# Patient Record
Sex: Female | Born: 1980 | Race: Black or African American | Hispanic: No | Marital: Single | Smoking: Current every day smoker
Health system: Southern US, Community
[De-identification: ages and names within clinical notes are randomized; demographics above are authoritative.]

## PROBLEM LIST (undated history)

## (undated) ENCOUNTER — Inpatient Hospital Stay (HOSPITAL_COMMUNITY): Payer: Self-pay

## (undated) DIAGNOSIS — O009 Unspecified ectopic pregnancy without intrauterine pregnancy: Secondary | ICD-10-CM

## (undated) DIAGNOSIS — F1020 Alcohol dependence, uncomplicated: Secondary | ICD-10-CM

## (undated) DIAGNOSIS — Z923 Personal history of irradiation: Secondary | ICD-10-CM

## (undated) DIAGNOSIS — C539 Malignant neoplasm of cervix uteri, unspecified: Secondary | ICD-10-CM

## (undated) HISTORY — DX: Alcohol dependence, uncomplicated: F10.20

## (undated) HISTORY — PX: ABDOMINAL HYSTERECTOMY: SHX81

## (undated) HISTORY — PX: DILATION AND CURETTAGE OF UTERUS: SHX78

---
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
## 1999-03-07 ENCOUNTER — Encounter: Payer: Self-pay | Admitting: Emergency Medicine

## 1999-03-07 ENCOUNTER — Emergency Department (HOSPITAL_COMMUNITY): Admission: EM | Admit: 1999-03-07 | Discharge: 1999-03-07 | Payer: Self-pay | Admitting: Emergency Medicine

## 1999-04-17 ENCOUNTER — Encounter: Admission: RE | Admit: 1999-04-17 | Discharge: 1999-05-25 | Payer: Self-pay | Admitting: Orthopedic Surgery

## 2000-09-23 ENCOUNTER — Emergency Department (HOSPITAL_COMMUNITY): Admission: EM | Admit: 2000-09-23 | Discharge: 2000-09-23 | Payer: Self-pay | Admitting: Emergency Medicine

## 2001-07-11 ENCOUNTER — Emergency Department (HOSPITAL_COMMUNITY): Admission: EM | Admit: 2001-07-11 | Discharge: 2001-07-11 | Payer: Self-pay | Admitting: Emergency Medicine

## 2003-06-25 ENCOUNTER — Emergency Department (HOSPITAL_COMMUNITY): Admission: EM | Admit: 2003-06-25 | Discharge: 2003-06-25 | Payer: Self-pay | Admitting: Emergency Medicine

## 2003-10-21 ENCOUNTER — Emergency Department (HOSPITAL_COMMUNITY): Admission: EM | Admit: 2003-10-21 | Discharge: 2003-10-21 | Payer: Self-pay | Admitting: Emergency Medicine

## 2003-12-07 ENCOUNTER — Emergency Department (HOSPITAL_COMMUNITY): Admission: EM | Admit: 2003-12-07 | Discharge: 2003-12-07 | Payer: Self-pay | Admitting: Emergency Medicine

## 2003-12-10 ENCOUNTER — Emergency Department (HOSPITAL_COMMUNITY): Admission: EM | Admit: 2003-12-10 | Discharge: 2003-12-10 | Payer: Self-pay | Admitting: Emergency Medicine

## 2003-12-11 ENCOUNTER — Inpatient Hospital Stay (HOSPITAL_COMMUNITY): Admission: AD | Admit: 2003-12-11 | Discharge: 2003-12-11 | Payer: Self-pay | Admitting: Obstetrics and Gynecology

## 2003-12-13 ENCOUNTER — Inpatient Hospital Stay (HOSPITAL_COMMUNITY): Admission: AD | Admit: 2003-12-13 | Discharge: 2003-12-13 | Payer: Self-pay | Admitting: Obstetrics and Gynecology

## 2007-05-29 ENCOUNTER — Emergency Department (HOSPITAL_COMMUNITY): Admission: EM | Admit: 2007-05-29 | Discharge: 2007-05-29 | Payer: Self-pay | Admitting: Emergency Medicine

## 2007-09-14 ENCOUNTER — Emergency Department (HOSPITAL_COMMUNITY): Admission: EM | Admit: 2007-09-14 | Discharge: 2007-09-14 | Payer: Self-pay | Admitting: Emergency Medicine

## 2007-09-14 ENCOUNTER — Ambulatory Visit (HOSPITAL_COMMUNITY): Admission: RE | Admit: 2007-09-14 | Discharge: 2007-09-14 | Payer: Self-pay | Admitting: Emergency Medicine

## 2008-03-24 ENCOUNTER — Emergency Department (HOSPITAL_COMMUNITY): Admission: EM | Admit: 2008-03-24 | Discharge: 2008-03-24 | Payer: Self-pay | Admitting: Emergency Medicine

## 2008-08-26 ENCOUNTER — Emergency Department (HOSPITAL_COMMUNITY): Admission: EM | Admit: 2008-08-26 | Discharge: 2008-08-26 | Payer: Self-pay | Admitting: Family Medicine

## 2008-12-13 ENCOUNTER — Emergency Department (HOSPITAL_COMMUNITY): Admission: EM | Admit: 2008-12-13 | Discharge: 2008-12-13 | Payer: Self-pay | Admitting: Emergency Medicine

## 2011-06-22 ENCOUNTER — Inpatient Hospital Stay (INDEPENDENT_AMBULATORY_CARE_PROVIDER_SITE_OTHER)
Admission: RE | Admit: 2011-06-22 | Discharge: 2011-06-22 | Disposition: A | Discharge status: Home / Self Care | Payer: Self-pay | Source: Ambulatory Visit | Attending: Family Medicine | Admitting: Family Medicine

## 2011-06-22 DIAGNOSIS — K047 Periapical abscess without sinus: Secondary | ICD-10-CM

## 2011-06-26 ENCOUNTER — Emergency Department (HOSPITAL_COMMUNITY)
Admission: EM | Admit: 2011-06-26 | Discharge: 2011-06-26 | Disposition: A | Discharge status: Home / Self Care | Payer: Self-pay | Attending: Emergency Medicine | Admitting: Emergency Medicine

## 2011-06-26 DIAGNOSIS — R221 Localized swelling, mass and lump, neck: Secondary | ICD-10-CM | POA: Insufficient documentation

## 2011-06-26 DIAGNOSIS — R220 Localized swelling, mass and lump, head: Secondary | ICD-10-CM | POA: Insufficient documentation

## 2011-06-26 DIAGNOSIS — K089 Disorder of teeth and supporting structures, unspecified: Secondary | ICD-10-CM | POA: Insufficient documentation

## 2011-06-26 LAB — DIFFERENTIAL
Basophils Relative: 0 % (ref 0–1)
Eosinophils Absolute: 0 10*3/uL (ref 0.0–0.7)
Lymphs Abs: 1.5 10*3/uL (ref 0.7–4.0)
Monocytes Absolute: 1.5 10*3/uL — ABNORMAL HIGH (ref 0.1–1.0)
Monocytes Relative: 7 % (ref 3–12)
Neutro Abs: 17.9 10*3/uL — ABNORMAL HIGH (ref 1.7–7.7)

## 2011-06-26 LAB — CBC
Hemoglobin: 14.5 g/dL (ref 12.0–15.0)
MCH: 34.3 pg — ABNORMAL HIGH (ref 26.0–34.0)
MCHC: 35.1 g/dL (ref 30.0–36.0)
MCV: 97.6 fL (ref 78.0–100.0)

## 2011-06-26 LAB — BASIC METABOLIC PANEL
BUN: 6 mg/dL (ref 6–23)
CO2: 25 mEq/L (ref 19–32)
Calcium: 9.8 mg/dL (ref 8.4–10.5)
Creatinine, Ser: 0.64 mg/dL (ref 0.50–1.10)
GFR calc non Af Amer: >60 mL/min (ref >60)
Glucose, Bld: 105 mg/dL — ABNORMAL HIGH (ref 70–99)
Sodium: 133 mEq/L — ABNORMAL LOW (ref 135–145)

## 2011-11-27 DIAGNOSIS — R51 Headache: Secondary | ICD-10-CM | POA: Insufficient documentation

## 2011-11-27 DIAGNOSIS — S01409A Unspecified open wound of unspecified cheek and temporomandibular area, initial encounter: Secondary | ICD-10-CM | POA: Insufficient documentation

## 2011-11-28 ENCOUNTER — Emergency Department (HOSPITAL_COMMUNITY)
Admission: EM | Admit: 2011-11-28 | Discharge: 2011-11-28 | Disposition: A | Discharge status: Home / Self Care | Payer: Self-pay | Attending: Emergency Medicine | Admitting: Emergency Medicine

## 2011-11-28 ENCOUNTER — Encounter (HOSPITAL_COMMUNITY): Payer: Self-pay | Admitting: Emergency Medicine

## 2011-11-28 DIAGNOSIS — S0181XA Laceration without foreign body of other part of head, initial encounter: Secondary | ICD-10-CM

## 2011-11-28 MED ORDER — OXYCODONE-ACETAMINOPHEN 5-325 MG PO TABS
2.0000 | ORAL_TABLET | Freq: Once | ORAL | Status: AC
  Administered 2011-11-28: 2 via ORAL
  Filled 2011-11-28: qty 2
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
## 2011-11-28 NOTE — ED Provider Notes (Signed)
Medical screening examination/treatment/procedure(s) were performed by non-physician practitioner and as supervising physician I was immediately available for consultation/collaboration.   Keydi Giel L Mithcell Schumpert, MD 11/28/11 0734 

## 2011-11-28 NOTE — ED Notes (Signed)
Laceration under right eye approx 1 cm in length  14 cm in depth.  Bleeding conttolled
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
## 2011-12-05 ENCOUNTER — Encounter (HOSPITAL_COMMUNITY): Payer: Self-pay | Admitting: *Deleted

## 2011-12-05 ENCOUNTER — Emergency Department (HOSPITAL_COMMUNITY)
Admission: EM | Admit: 2011-12-05 | Discharge: 2011-12-05 | Disposition: A | Discharge status: Home / Self Care | Payer: Self-pay | Attending: Emergency Medicine | Admitting: Emergency Medicine

## 2011-12-05 DIAGNOSIS — F17200 Nicotine dependence, unspecified, uncomplicated: Secondary | ICD-10-CM | POA: Insufficient documentation

## 2011-12-05 DIAGNOSIS — R220 Localized swelling, mass and lump, head: Secondary | ICD-10-CM | POA: Insufficient documentation

## 2011-12-05 DIAGNOSIS — Z4802 Encounter for removal of sutures: Secondary | ICD-10-CM | POA: Insufficient documentation

## 2011-12-05 NOTE — ED Notes (Signed)
Patient here for removal of stiches that are under her right eye

## 2011-12-05 NOTE — ED Provider Notes (Signed)
History     CSN: [id number]  Arrival date & time 12/05/11  1340   First MD Initiated Contact with Patient 12/05/11 1531      Chief Complaint  Patient presents with  . Suture / Staple Removal    placed last monday  . Facial Swelling    (Consider location/radiation/quality/duration/timing/severity/associated sxs/prior treatment) HPI Comments: Patient sustained a laceration one week ago today.  She had 5 sutures placed in her right cheek.  She comes in today because she believes they need to come out as they are starting to get overgrown.  She's had no significant drainage or fevers.  She has some mild swelling to her cheek still but other than that has no new complaints  Patient is a 31 y.o. female presenting with suture removal. The history is provided by the patient. No language interpreter was used.  Suture / Staple Removal  The sutures were placed 7 to 10 days ago. There has been no treatment since the wound repair.    History reviewed. No pertinent past medical history.  History reviewed. No pertinent past surgical history.  History reviewed. No pertinent family history.  History  Substance Use Topics  . Smoking status: Current Everyday Smoker  . Smokeless tobacco: Not on file  . Alcohol Use: Yes     occ    OB History    Grav Para Term Preterm Abortions TAB SAB Ect Mult Living                  Review of Systems  Constitutional: Negative.   HENT: Negative.   Eyes: Negative.  Negative for discharge.  Respiratory: Negative.   Cardiovascular: Negative.   Gastrointestinal: Negative.   Genitourinary: Negative.   Musculoskeletal: Negative.   Skin: Negative.  Negative for color change.  Neurological: Negative.  Negative for headaches.  Hematological: Negative.   Psychiatric/Behavioral: Negative.   All other systems reviewed and are negative.    Allergies  Food and Neosporin  Home Medications   Current Outpatient Rx  Name Route Sig Dispense Refill  .  IBUPROFEN 200 MG PO TABS Oral Take 200 mg by mouth every 6 (six) hours as needed. For pain      BP 114/81  Pulse 88  Temp(Src) 98.7 F (37.1 C) (Oral)  Resp 20  SpO2 100%  LMP 12/01/2011  Physical Exam  Nursing note and vitals reviewed. Constitutional: She is oriented to person, place, and time. She appears well-developed and well-nourished.  Non-toxic appearance. She does not have a sickly appearance.  HENT:  Head: Normocephalic and atraumatic.  Eyes: Conjunctivae, EOM and lids are normal. Pupils are equal, round, and reactive to light. No scleral icterus.  Neck: Trachea normal and normal range of motion. Neck supple.  Cardiovascular: Normal rate.   Pulmonary/Chest: Effort normal and breath sounds normal.  Abdominal: Soft. Normal appearance. There is no CVA tenderness.  Musculoskeletal: Normal range of motion.  Neurological: She is alert and oriented to person, place, and time. She has normal strength.  Skin: Skin is warm, dry and intact. No rash noted.       Right cheek has approximately 1 inch wound with 5 Sutures in place.  Wound appears well-healed and stitches are starting to be overgrown by tissue  Psychiatric: She has a normal mood and affect. Her behavior is normal. Judgment and thought content normal.    ED Course  SUTURE REMOVAL Date/Time: 12/05/2011 3:49 PM Performed by: Emeline General A Authorized by: Emeline General A Consent: Verbal  consent obtained. Risks and benefits: risks, benefits and alternatives were discussed Consent given by: patient Patient understanding: patient states understanding of the procedure being performed Patient identity confirmed: verbally with patient Body area: head/neck Location details: right cheek Wound Appearance: clean Sutures Removed: 5 Post-removal: Steri-Strips applied Facility: sutures placed in this facility Patient tolerance: Patient tolerated the procedure well with no immediate complications.   (including critical  care time)  Labs Reviewed - No data to display No results found.   1. Visit for suture removal       MDM  Patient had her 5 sutures removed.  There was still some gaping of the lateral aspect of the wound so Steri-Strips were placed which gently close the wound.  Sutures have been in for 7 days so they did necessitate removal on this visit.  There's no signs of acute infection.  Patient was given further wound care instructions.        Nat Christen, MD 12/05/11 1550

## 2011-12-05 NOTE — ED Notes (Signed)
Pt has swelling under right eye from stitches and thinks need to come out since placed over a week ago

## 2014-01-26 ENCOUNTER — Emergency Department (HOSPITAL_COMMUNITY): Payer: Self-pay

## 2014-01-26 ENCOUNTER — Emergency Department (HOSPITAL_COMMUNITY)
Admission: EM | Admit: 2014-01-26 | Discharge: 2014-01-26 | Disposition: A | Discharge status: Home / Self Care | Payer: Self-pay | Attending: Emergency Medicine | Admitting: Emergency Medicine

## 2014-01-26 ENCOUNTER — Encounter (HOSPITAL_COMMUNITY): Payer: Self-pay | Admitting: Emergency Medicine

## 2014-01-26 DIAGNOSIS — R209 Unspecified disturbances of skin sensation: Secondary | ICD-10-CM | POA: Insufficient documentation

## 2014-01-26 DIAGNOSIS — M25519 Pain in unspecified shoulder: Secondary | ICD-10-CM | POA: Insufficient documentation

## 2014-01-26 DIAGNOSIS — M25512 Pain in left shoulder: Secondary | ICD-10-CM

## 2014-01-26 DIAGNOSIS — F17200 Nicotine dependence, unspecified, uncomplicated: Secondary | ICD-10-CM | POA: Insufficient documentation

## 2014-01-26 DIAGNOSIS — M542 Cervicalgia: Secondary | ICD-10-CM | POA: Insufficient documentation

## 2014-01-26 DIAGNOSIS — M791 Myalgia, unspecified site: Secondary | ICD-10-CM

## 2014-01-26 MED ORDER — DIAZEPAM 5 MG PO TABS: 5.0000 mg | ORAL_TABLET | Freq: Once | ORAL | Status: DC

## 2014-01-26 MED ORDER — OXYCODONE-ACETAMINOPHEN 5-325 MG PO TABS
1.0000 | ORAL_TABLET | Freq: Once | ORAL | Status: AC
  Administered 2014-01-26: 1 via ORAL
  Filled 2014-01-26: qty 1

## 2014-01-26 MED ORDER — METHOCARBAMOL 500 MG PO TABS
500.0000 mg | ORAL_TABLET | Freq: Once | ORAL | Status: AC
  Administered 2014-01-26: 500 mg via ORAL
  Filled 2014-01-26: qty 1

## 2014-01-26 MED ORDER — MELOXICAM 7.5 MG PO TABS: 7.5000 mg | ORAL_TABLET | Freq: Every day | ORAL | Status: DC

## 2014-01-26 MED ORDER — OXYCODONE-ACETAMINOPHEN 5-325 MG PO TABS: 1.0000 | ORAL_TABLET | Freq: Three times a day (TID) | ORAL | Status: DC | PRN

## 2014-01-26 MED ORDER — METHOCARBAMOL 500 MG PO TABS: 500.0000 mg | ORAL_TABLET | Freq: Two times a day (BID) | ORAL | Status: DC

## 2014-01-26 NOTE — ED Notes (Signed)
Pt stating pain is 10/10.  Nad.  Pt resting comfortably in chair.

## 2014-01-26 NOTE — ED Notes (Signed)
Pt states she woke up a week ago and had pain in her left arm and thinks it is a pinched nerve because she has pain in her neck and hand too.

## 2014-01-26 NOTE — Discharge Instructions (Signed)
Please call your doctor for a followup appointment within 24-48 hours. When you talk to your doctor please let them know that you were seen in the emergency department and have them acquire all of your records so that they can discuss the findings with you and formulate a treatment plan to fully care for your new and ongoing problems. Please call and set up appointment with orthopedics to be reassessed regarding episode of neck pain Please apply ice and icy hot ointment to aid in muscular relief Please take medications as prescribed-while on pain medications his be absolutely no drinking alcohol, driving, operating any heavy machinery. Please do not take any extra Tylenol for this can lead to Tylenol overdose and liver failure. Please take on a full stomach. Please take MOBIC and Robaxin as prescribed Please massage with icy hot ointment to aid in muscular relief Please continue to monitor symptoms closely and if symptoms are to worsen or change (fever greater 101, chills, nausea, vomiting, chest pain, shortness of breath, difficulty breathing, numbness, tingling, worsening neck pain, inability to swallow) please report back to the ED immediately  Myalgia, Adult Myalgia is the medical term for muscle pain. It is a symptom of many things. Nearly everyone at some time in their life has this. The most common cause for muscle pain is overuse or straining and more so when you are not in shape. Injuries and muscle bruises cause myalgias. Muscle pain without a history of injury can also be caused by a virus. It frequently comes along with the flu. Myalgia not caused by muscle strain can be present in a large number of infectious diseases. Some autoimmune diseases like lupus and fibromyalgia can cause muscle pain. Myalgia may be mild, or severe. SYMPTOMS  The symptoms of myalgia are simply muscle pain. Most of the time this is short lived and the pain goes away without treatment. DIAGNOSIS  Myalgia is diagnosed  by your caregiver by taking your history. This means you tell him when the problems began, what they are, and what has been happening. If this has not been a long term problem, your caregiver may want to watch for a while to see what will happen. If it has been long term, they may want to do additional testing. TREATMENT  The treatment depends on what the underlying cause of the muscle pain is. Often anti-inflammatory medications will help. HOME CARE INSTRUCTIONS  If the pain in your muscles came from overuse, slow down your activities until the problems go away.  Myalgia from overuse of a muscle can be treated with alternating hot and cold packs on the muscle affected or with cold for the first couple days. If either heat or cold seems to make things worse, stop their use.  Apply ice to the sore area for 15-20 minutes, 03-04 times per day, while awake for the first 2 days of muscle soreness, or as directed. Put the ice in a plastic bag and place a towel between the bag of ice and your skin.  Only take over-the-counter or prescription medicines for pain, discomfort, or fever as directed by your caregiver.  Regular gentle exercise may help if you are not active.  Stretching before strenuous exercise can help lower the risk of myalgia. It is normal when beginning an exercise regimen to feel some muscle pain after exercising. Muscles that have not been used frequently will be sore at first. If the pain is extreme, this may mean injury to a muscle. [place]  IF:  You have an increase in muscle pain that is not relieved with medication.  You begin to run a temperature.  You develop nausea and vomiting.  You develop a stiff and painful neck.  You develop a rash.  You develop muscle pain after a tick bite.  You have continued muscle pain while working out even after you are in good condition. SEEK IMMEDIATE MEDICAL CARE IF: Any of your problems are getting worse and medications are  not helping. MAKE SURE YOU:   Understand these instructions.  Will watch your condition.  Will get help right away if you are not doing well or get worse. Document Released: 09/26/2006 Document Revised: 01/27/2012 Document Reviewed: 12/16/2006 [facility] Patient Information 2014 [employer] [place].   Emergency Department Resource Guide 1) Find a Doctor and Pay Out of Pocket Although you won't have to find out who is covered by your insurance plan, it is a good idea to ask around and get recommendations. You will then need to call the office and see if the doctor you have chosen will accept you as a new patient and what types of options they offer for patients who are self-pay. Some doctors offer discounts or will set up payment plans for their patients who do not have insurance, but you will need to ask so you aren't surprised when you get to your appointment.  2) Contact Your Local Health Department Not all health departments have doctors that can see patients for sick visits, but many do, so it is worth a call to see if yours does. If you don't know where your local health department is, you can check in your phone book. The CDC also has a tool to help you locate your state's health department, and many state websites also have listings of all of their local health departments.  3) Find a Walk-in Clinic If your illness is not likely to be very severe or complicated, you may want to try a walk in clinic. These are popping up all over the country in pharmacies, drugstores, and shopping centers. They're usually staffed by nurse practitioners or physician assistants that have been trained to treat common illnesses and complaints. They're usually fairly quick and inexpensive. However, if you have serious medical issues or chronic medical problems, these are probably not your best option.  No Primary Care Doctor: - Call Health Connect at  [phone] - they can help you locate a primary care doctor that   accepts your insurance, provides certain services, etc. - Physician Referral Service- [phone]  Chronic Pain Problems: Organization         Address  Phone   Notes  [facility]  [phone] Patients need to be referred by their primary care doctor.   Medication Assistance: Organization         Address  Phone   Notes  [facility] [address] [place], [place] [id number] [phone] --Must be a resident of [place] [place] -- Must have NO insurance coverage whatsoever (no Medicaid/ Medicare, etc.) -- The pt. MUST have a primary care doctor that directs their care regularly and follows them in the community   MedAssist  [phone]   [employer]  [phone]    Agencies that provide inexpensive medical care: Organization         Address  Phone   Notes  [facility]  [phone]   [facility]    (  [phone]   [facility] [address] [place], [place] [address] [phone]   [facility] [address] [place] [place] [phone]   Planned Parenthood    [phone]   [facility]    [phone]   [facility]  [address], [place] Phone:  [phone], Fax:  [phone] Hours of Operation:  9 am - 6 pm, M-F.  Also accepts Medicaid/Medicare and self-pay.  [facility]  [address], [place] Phone: [phone], Fax: [phone]. Hours of Operation:  8:30 am - 5:30 pm, M-F.  Also accepts Medicaid and self-pay.  [facility] [place] [address] [place] Phone: [phone]   [facility] [address] Natasha Bence [place], [place] [phone], Ext. [phone] Mondays & Thursdays: 7-9 AM.  First 15 patients are seen on a first come, first serve basis.    Medicaid-accepting [facility] Providers:  Organization          Address  Phone   Notes  [facility] [place] [address], [place] [phone] Also accepts self-pay patients.  [facility] [address] Laurell Josephs [place], [place]  [phone]   [facility] [address], [place] [phone]   [facility] [address], [place] [phone]   Renaye Rakers [address], [place]   [phone] Only accepts [place] Access [place] patients after they have their name applied to their card.   Self-Pay (no insurance) in [facility]:  Organization         Address  Phone   Notes  Sickle Cell Patients, [facility] [address] [place], [place] [phone]   [facility] [address] [place], [place] [phone]   [facility]  [address], [place] [phone]   [facility]/Dr. Osei-Bonsu  [address], [place] or [address], [place] [phone] Phone number for both [place] and [place] locations is the same.  [facility] [address] [place] [phone]   [facility] [address] [place] or [address] [phone]   [facility] [address], [place] [phone], phone; [phone], fax Sees patients 1st and 3rd Saturday of every month.  Must not qualify for public or private insurance (i.e. Medicaid, Medicare, [place] Health Choice, Veterans' Benefits)  Household income should be no more than 200% of the poverty level The clinic cannot treat you if you are pregnant or think you are pregnant  Sexually transmitted diseases are not treated at the clinic.    Dental Care: Organization         Address  Phone  Notes  [facility] Department of [facility] [address] [place],  [place] [phone] Accepts children up to age 93 who are enrolled in [place] or [place] Health Choice; pregnant women with a Medicaid card; and children who have applied for Medicaid or [place] Health Choice, but were declined, whose parents can pay a reduced fee at time of service.  [facility] Department of [facility]  [address], [place] [phone] Accepts children up  to age 73 who are enrolled in Medicaid or [place] Health Choice; pregnant women with a Medicaid card; and children who have applied for Medicaid or [place] Health Choice, but were declined, whose parents can pay a reduced fee at time of service.  [place] Adult Dental Access PROGRAM  [place] [phone] Patients are seen by appointment only. Walk-ins are not accepted. [place] will see patients 61 years of age and older. Monday - Tuesday (8am-5pm) Most Wednesdays (8:30-5pm) $30 per visit, cash only  [facility] Adult Dental Access PROGRAM  [address], [facility] [phone] Patients are seen by appointment only. Walk-ins are not accepted. [place] will see patients 33 years of age and older. One Wednesday Evening (Monthly: Volunteer Based).  $30 per visit, cash only  [place]  [phone] for adults; Children under age 43, call Graduate Pediatric Dentistry at [phone]. Children aged 40-14, please call [phone] to request a pediatric application.  Dental services are provided in all areas of dental care including fillings, crowns and bridges, complete and partial dentures, implants, gum treatment, root canals, and extractions. Preventive care is also provided. Treatment is provided to both adults and children. Patients are selected via a lottery and there is often a waiting list.   [facility] [address] [place]  [phone] [email or web address]   [facility] [address] [place], [place]  [phone], Ext. [phone] Second and Fourth Thursday of each month, opens at 6:30 AM; Clinic ends at 9 AM.  Patients are seen on a first-come first-served basis, and a limited number are seen during each clinic.   [facility]  [address] Hillard Danker [place], [place] [phone]   Eligibility Requirements You must have lived in [place], [place], or [place] counties for at least the last three months.   You cannot be eligible for state or federal sponsored [employer], including [employer], [place], or [employer].   You generally cannot be eligible for healthcare insurance through your employer.    How to apply: Eligibility screenings are held every Tuesday and Wednesday afternoon from 1:00 pm until 4:00 pm. You do not need an appointment for the interview!  [facility] [address], [place], [place]   [place]  [place] Department  [place]  [phone]    Behavioral Health Resources in the Community: Intensive Outpatient Programs Organization         Address  Phone  Notes  [place]. [address], [place], [place] [phone]   [facility] Outpatient [address], [place], [place]   ADS: [facility] [address], [place], [place]   [place] [address]  [place], [place] or [phone]   Substance Abuse Resources Organization         Address  Phone  Notes  [facility] Drug Services  [phone]   [place]  [phone]   [facility] [place]   Chinita Pester  [phone]   Residential & Outpatient Substance Abuse Program  [phone]   Psychological Services Organization         Address  Phone  Notes  [place]  [place]  [place]    [place]  [address], [place] [phone] or [phone]    Mobile Crisis Teams Organization         Address  Phone  Notes  [facility], Mobile Crisis Care Unit  [phone]   [facility]  [address] [place], [place] [phone]   Doristine Locks [address] [place] [phone]    Self-Help/Support Groups Organization         Address  Phone             Notes  Mental Health Assoc. of [employer] - variety of support groups  [phone]- I7437963 Call for more information  Narcotics Anonymous (NA), [facility] [address], [employer] [place]  2 meetings at this location   Statistician         Address  Phone  Notes  [facility] [address],    [place]  [phone]   [facility] [place]  [address] [place] [address], [place], [place] [phone]   [facility] [address] [place], [place] [phone] Admissions: 8am-3pm M-F  [facility] [address],    [place], [place] [phone]   [facility] [address] [place], [place], [place] [phone]   [facility] [address],  [place], [place] [phone]   [facility] - Intensive Outpatient [address] Laurell Josephs [address], [place], [place] [phone]   [facility] : [facility] (Addiction Recovery Care [facility] [address] [place].,  [place], [place] [phone] or [phone]   [facility] ([facility]) [address], [place], [place] [phone] Accepts Medicaid  [facility] [place] [address]  [place] [phone] Substance Abuse/Addiction Treatment   [facility] Organization         Address  Phone  Notes  [facility]  [phone]   Angie Fava, PhD [address] Ervin Knack [place], [place]   ([phone] or [phone]   [facility] Behavioral   [address] [place], [place] [phone]   [facility] [address] [place], [place] [phone] Insurance/Medicaid/sponsorship through [facility] and Families [address]                                    [place], [place] [phone] Therapy/tele-psych/case  [name] [address], [place] [phone]    Dr. Lolly Mustache  [phone]   [facility]  [employer] [facility] Dept. 1) [address] [place] 2) [address], [place] 3)  [address], [place] [phone]  [phone]   [facility] Child Abuse Hotline (414)[phone] or [phone] (After Hours)

## 2014-01-26 NOTE — ED Provider Notes (Signed)
CSN: [id number]     Arrival date & time 01/26/14  1211 History  This chart was scribed for non-physician practitioner, Jamse Mead, PA-C,working with Richarda Blade, MD, by Marlowe Kays, ED Scribe.  This patient was seen in room TR04C/TR04C and the patient's care was started at 2:53 PM.  Chief Complaint  Patient presents with  . Arm Pain   The history is provided by the patient. No language interpreter was used.   HPI Comments:  Sarah Morris is a 33 y.o. female who presents to the Emergency Department complaining of severe constant sharp, aching, throbbing left-sided neck pain that radiates down her arm that started approximately two weeks ago upon waking. She reports associated numbness and tingling of the left arm and intermittently into the hand. Pt reports having similar symptoms before that resolved on their own. She states she has been taking [employer], BC Powder, Ibuprofen, and Tylenol with codeine with no relief. Pt reports not being able to lift anything with that arm due to the pain. 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 She denies CP, SOB, or difficulty breathing. She denies prior injury to the arm. She states she is allergic to Neosporin and black pepper.   History reviewed. No pertinent past medical history. History reviewed. No pertinent past surgical history. History reviewed. No pertinent family history. History  Substance Use Topics  . Smoking status: Current Every Day Smoker    Types: Cigarettes  . Smokeless tobacco: Not on file  . Alcohol Use: Yes     Comment: occ   OB History   Grav Para Term Preterm Abortions TAB SAB Ect Mult Living                 Review of Systems  Respiratory: Negative for shortness of breath.   Cardiovascular: Negative for chest pain.  Musculoskeletal: Positive for myalgias (left arm).  Neurological: Positive for numbness.  All other systems reviewed and are negative.   Allergies  Food and Neosporin  Home  Medications   Current Outpatient Rx  Name  Route  Sig  Dispense  Refill  . Aspirin-Salicylamide-Caffeine (BC HEADACHE PO)   Oral   Take 1 packet by mouth daily as needed (for pain).         Marland Kitchen ibuprofen (ADVIL,MOTRIN) 200 MG tablet   Oral   Take 600 mg by mouth every 6 (six) hours as needed for moderate pain. For pain         . meloxicam (MOBIC) 7.5 MG tablet   Oral   Take 1 tablet (7.5 mg total) by mouth daily.   15 tablet   0   . methocarbamol (ROBAXIN) 500 MG tablet   Oral   Take 1 tablet (500 mg total) by mouth 2 (two) times daily.   20 tablet   0   . oxyCODONE-acetaminophen (PERCOCET/ROXICET) 5-325 MG per tablet   Oral   Take 1 tablet by mouth every 8 (eight) hours as needed for severe pain.   11 tablet   0    Triage Vitals: BP 111/92  Pulse 110  Temp(Src) 98.6 F (37 C) (Oral)  Resp 24  Wt 119 lb 14.4 oz (54.386 kg)  SpO2 100%  LMP 01/05/2014 Physical Exam  Nursing note and vitals reviewed. Constitutional: She is oriented to person, place, and time. She appears well-developed and well-nourished. No distress.  HENT:  Head: Normocephalic and atraumatic.  Mouth/Throat: Oropharynx is clear and moist. No oropharyngeal exudate.  Negative trismus  Uvula midline with symmetrical elevation  Eyes: Conjunctivae and EOM are normal. Pupils are equal, round, and reactive to light. Right eye exhibits no discharge. Left eye exhibits no discharge.  Neck: Normal range of motion. Neck supple.    Discomfort upon palpation to the left side of the neck-muscular nature Negative nuchal rigidity Negative cervical lymphadenopathy  Cardiovascular: Normal rate, regular rhythm and normal heart sounds.  Exam reveals no friction rub.   No murmur heard. Pulses:      Radial pulses are 2+ on the right side, and 2+ on the left side.  Pulmonary/Chest: Effort normal and breath sounds normal. No respiratory distress. She has no wheezes. She has no rales.  Patient is able to speak in full  sentences without difficulty Negative stridor Negative signs of respiratory distress  Musculoskeletal: She exhibits tenderness.       Arms: Negative swelling, erythema, inflammation, lesions, sores, deformities noted to the left shoulder and left upper [place]. Negative deformities identified to the neck. Discomfort upon palpation to the trapezius of the left side, left shoulder, deltoid circumferentially. Muscular pain identified. Decreased range of motion to left upper tremors secondary to pain.  Neurological: She is alert and oriented to person, place, and time. No cranial nerve deficit. She exhibits normal muscle tone. Coordination normal.  Cranial nerves III-XII grossly intact Strength 5+/5+ to upper extremities bilaterally with resistance applied, equal distribution noted Strength intact to MCP, PIP, DIP joints of left hand Sensation intact with differentiation to sharp and dull touch to left upper extremity  Skin: Skin is warm and dry. She is not diaphoretic.  Psychiatric: She has a normal mood and affect. Her behavior is normal. Thought content normal.    ED Course  Procedures (including critical care time) DIAGNOSTIC STUDIES: Oxygen Saturation is 100% on RA, normal by my interpretation.   COORDINATION OF CARE: 2:59 PM- Will give injection of Valium prior to discharge. Pt verbalizes understanding and agrees to plan.  Medications  oxyCODONE-acetaminophen (PERCOCET/ROXICET) 5-325 MG per tablet 1 tablet (1 tablet Oral Given 01/26/14 1438)  oxyCODONE-acetaminophen (PERCOCET/ROXICET) 5-325 MG per tablet 1 tablet (1 tablet Oral Given 01/26/14 1534)  methocarbamol (ROBAXIN) tablet 500 mg (500 mg Oral Given 01/26/14 1534)   Dg Cervical Spine Complete  01/26/2014   CLINICAL DATA Pain with radicular symptoms  EXAM CERVICAL SPINE  4+ VIEWS  COMPARISON None.  FINDINGS Frontal, lateral, open-mouth odontoid, and bilateral oblique views were obtained. There is no fracture or spondylolisthesis.  Prevertebral soft tissues and predental space regions are normal. Disc spaces appear intact. There is no appreciable facet arthropathy on the oblique views. There is reversal of lordotic curvature.  IMPRESSION Reversal of lordotic curvature. This finding is probably due to muscle spasm. If there is concern for ligamentous injury, lateral flexion-extension views could be helpful to further evaluate. There is no appreciable arthropathy. No fracture or spondylolisthesis.  SIGNATURE  Electronically Signed   By: Lowella Grip M.D.   On: 01/26/2014 14:19   Dg Shoulder Left  01/26/2014   CLINICAL DATA Pain  EXAM LEFT SHOULDER - 2+ VIEW  COMPARISON None.  FINDINGS Frontal and Y scapular views were obtained. No fracture or dislocation. Joint spaces appear intact. No erosive change.  IMPRESSION No fracture or dislocation.  No appreciable arthropathy.  SIGNATURE  Electronically Signed   By: Lowella Grip M.D.   On: 01/26/2014 14:19   Labs Review Labs Reviewed - No data to display Imaging Review Dg Cervical Spine Complete  01/26/2014   CLINICAL  DATA Pain with radicular symptoms  EXAM CERVICAL SPINE  4+ VIEWS  COMPARISON None.  FINDINGS Frontal, lateral, open-mouth odontoid, and bilateral oblique views were obtained. There is no fracture or spondylolisthesis. Prevertebral soft tissues and predental space regions are normal. Disc spaces appear intact. There is no appreciable facet arthropathy on the oblique views. There is reversal of lordotic curvature.  IMPRESSION Reversal of lordotic curvature. This finding is probably due to muscle spasm. If there is concern for ligamentous injury, lateral flexion-extension views could be helpful to further evaluate. There is no appreciable arthropathy. No fracture or spondylolisthesis.  SIGNATURE  Electronically Signed   By: Lowella Grip M.D.   On: 01/26/2014 14:19   Dg Shoulder Left  01/26/2014   CLINICAL DATA Pain  EXAM LEFT SHOULDER - 2+ VIEW  COMPARISON None.   FINDINGS Frontal and Y scapular views were obtained. No fracture or dislocation. Joint spaces appear intact. No erosive change.  IMPRESSION No fracture or dislocation.  No appreciable arthropathy.  SIGNATURE  Electronically Signed   By: Lowella Grip M.D.   On: 01/26/2014 14:19     EKG Interpretation None      MDM   Final diagnoses:  Neck pain  Left shoulder pain  Muscular pain   Medications  oxyCODONE-acetaminophen (PERCOCET/ROXICET) 5-325 MG per tablet 1 tablet (1 tablet Oral Given 01/26/14 1438)  oxyCODONE-acetaminophen (PERCOCET/ROXICET) 5-325 MG per tablet 1 tablet (1 tablet Oral Given 01/26/14 1534)  methocarbamol (ROBAXIN) tablet 500 mg (500 mg Oral Given 01/26/14 1534)    Filed Vitals:   01/26/14 1215 01/26/14 1628  BP: 111/92 104/78  Pulse: 110 85  Temp: 98.6 F (37 C) 97.9 F (36.6 C)  TempSrc: Oral   Resp: 24 16  Weight: 119 lb 14.4 oz (54.386 kg)   SpO2: 100% 98%    I personally performed the services described in this documentation, which was scribed in my presence. The recorded information has been reviewed and is accurate.  Patient presenting to the ED with left-sided neck and arm pain that started approximately 2 weeks ago. Stated that it is a tightness, soreness, pulling sensation worse with motion. Stated that she's been using ibuprofen, Tylenol, BC powder over-the-counter with minimal relief. Patient reports that she works at a [employer] heavy boxes on a daily basis. Patient reported that she had this episode and symptoms once before-reported that it lasts approximately a week and half. Alert and oriented. GCS 15. Heart rate and rhythm normal. Radial pulses 2+ bilaterally. Lungs good auscultation to upper lower lobes bilaterally. Negative deformities identified to the neck or left shoulder. Tenderness upon palpation to the musculature of the left of the neck and left shoulder circumferentially and discomfort upon palpation to the left deltoid  circumferentially. Full range of motion to upper tremors bilaterally-discomfort identified with motion to left upper extremity. Equal grip strength. Full strength intact to digits of the left hand. Sensation intact with differentiation sharp and dull touch. Plain film of cervical spine identified muscle spasm-negative findings of acute osseous injury. Left shoulder plain film negative for acute osseous injury or fracture. Doubt meningitis. Suspicion to be muscular pain secondary to pain upon palpation and pain with motion. Patient neurovascularly intact. Pain medications administered in ED setting with relief. Patient stable, afebrile. Discharged patient. Ref[facility]. Discussed with patient to apply warm compressions. Discussed with patient to rest and ice. Discharged patient with muscle relaxer, small dose of pain medication (discussed precautions, disposal technique,  course), anti-inflammatories. Discussed with patient to rest and stay hydrated. Discussed with patient to closely monitor symptoms and if symptoms are to worsen or change to report back to the ED - strict return instructions given.  Patient agreed to plan of care, understood, all questions answered.   Jamse Mead, PA-C 01/26/14 1751

## 2014-01-28 NOTE — ED Provider Notes (Signed)
Medical screening examination/treatment/procedure(s) were performed by non-physician practitioner and as supervising physician I was immediately available for consultation/collaboration.   EKG Interpretation None       Richarda Blade, MD 01/28/14 1104

## 2015-11-03 ENCOUNTER — Emergency Department (HOSPITAL_COMMUNITY)
Admission: EM | Admit: 2015-11-03 | Discharge: 2015-11-03 | Disposition: A | Discharge status: Home / Self Care | Payer: 59 | Attending: Emergency Medicine | Admitting: Emergency Medicine

## 2015-11-03 ENCOUNTER — Emergency Department (HOSPITAL_COMMUNITY): Payer: 59

## 2015-11-03 ENCOUNTER — Encounter (HOSPITAL_COMMUNITY): Payer: Self-pay | Admitting: Emergency Medicine

## 2015-11-03 DIAGNOSIS — O200 Threatened abortion: Secondary | ICD-10-CM | POA: Diagnosis not present

## 2015-11-03 DIAGNOSIS — O99331 Smoking (tobacco) complicating pregnancy, first trimester: Secondary | ICD-10-CM | POA: Diagnosis not present

## 2015-11-03 DIAGNOSIS — R102 Pelvic and perineal pain: Secondary | ICD-10-CM

## 2015-11-03 DIAGNOSIS — F17210 Nicotine dependence, cigarettes, uncomplicated: Secondary | ICD-10-CM | POA: Insufficient documentation

## 2015-11-03 DIAGNOSIS — A599 Trichomoniasis, unspecified: Secondary | ICD-10-CM

## 2015-11-03 DIAGNOSIS — O98811 Other maternal infectious and parasitic diseases complicating pregnancy, first trimester: Secondary | ICD-10-CM | POA: Insufficient documentation

## 2015-11-03 DIAGNOSIS — A5901 Trichomonal vulvovaginitis: Secondary | ICD-10-CM | POA: Diagnosis not present

## 2015-11-03 DIAGNOSIS — O209 Hemorrhage in early pregnancy, unspecified: Secondary | ICD-10-CM | POA: Diagnosis present

## 2015-11-03 DIAGNOSIS — Z79899 Other long term (current) drug therapy: Secondary | ICD-10-CM | POA: Insufficient documentation

## 2015-11-03 DIAGNOSIS — N730 Acute parametritis and pelvic cellulitis: Secondary | ICD-10-CM

## 2015-11-03 DIAGNOSIS — Z3A10 10 weeks gestation of pregnancy: Secondary | ICD-10-CM | POA: Insufficient documentation

## 2015-11-03 HISTORY — DX: Unspecified ectopic pregnancy without intrauterine pregnancy: O00.90

## 2015-11-03 LAB — WET PREP, GENITAL
Clue Cells Wet Prep HPF POC: NONE SEEN
SPERM: NONE SEEN
YEAST WET PREP: NONE SEEN

## 2015-11-03 LAB — CBC
HCT: 34.6 % — ABNORMAL LOW (ref 36.0–46.0)
HEMOGLOBIN: 11.7 g/dL — AB (ref 12.0–15.0)
MCH: 34.4 pg — ABNORMAL HIGH (ref 26.0–34.0)
MCHC: 33.8 g/dL (ref 30.0–36.0)
MCV: 101.8 fL — ABNORMAL HIGH (ref 78.0–100.0)
PLATELETS: 222 10*3/uL (ref 150–400)
RBC: 3.4 MIL/uL — AB (ref 3.87–5.11)
RDW: 11.9 % (ref 11.5–15.5)
WBC: 7.4 10*3/uL (ref 4.0–10.5)

## 2015-11-03 LAB — RH IG WORKUP (INCLUDES ABO/RH)
ABO/RH(D): O POS
GESTATIONAL AGE(WKS): 8

## 2015-11-03 LAB — HCG, QUANTITATIVE, PREGNANCY: hCG, Beta Chain, Quant, S: 168798 m[IU]/mL — ABNORMAL HIGH (ref <5)

## 2015-11-03 MED ORDER — CEFTRIAXONE SODIUM 250 MG IJ SOLR
250.0000 mg | Freq: Once | INTRAMUSCULAR | Status: AC
  Administered 2015-11-03: 250 mg via INTRAMUSCULAR
  Filled 2015-11-03: qty 250

## 2015-11-03 MED ORDER — METRONIDAZOLE 500 MG PO TABS
2000.0000 mg | ORAL_TABLET | Freq: Once | ORAL | Status: AC
  Administered 2015-11-03: 2000 mg via ORAL
  Filled 2015-11-03: qty 4

## 2015-11-03 MED ORDER — STERILE WATER FOR INJECTION IJ SOLN
INTRAMUSCULAR | Status: AC
  Administered 2015-11-03: 10 mL
  Filled 2015-11-03: qty 10

## 2015-11-03 MED ORDER — AZITHROMYCIN 250 MG PO TABS
1000.0000 mg | ORAL_TABLET | Freq: Once | ORAL | Status: AC
  Administered 2015-11-03: 1000 mg via ORAL
  Filled 2015-11-03: qty 4

## 2015-11-03 NOTE — Discharge Instructions (Signed)
You are 9 weeks and 5 days pregnant.  Your pain is likely due to pelvic infection and trichomonas.  Follow up with OBGYN for further care.  Continue taking keflex for your urinary tract infection  Pelvic Inflammatory Disease Pelvic inflammatory disease (PID) is an infection in some or all of the female organs. PID can be in the uterus, ovaries, fallopian tubes, or the surrounding tissues that are inside the lower belly area (pelvis). PID can lead to lasting problems if it is not treated. To check for this disease, your doctor may:  Do a physical exam.  Do blood tests, urine tests, or a pregnancy test.  Look at your vaginal discharge.  Do tests to look inside the pelvis.  Test you for other infections. HOME CARE  Take over-the-counter and prescription medicines only as told by your doctor.  If you were prescribed an antibiotic medicine, take it as told by your doctor. Do not stop taking it even if you start to feel better.  Do not have sex until treatment is done or as told by your doctor.  Tell your sex partner if you have PID. Your partner may need to be treated.  Keep all follow-up visits as told by your doctor. This is important.  Your doctor may test you for infection again 3 months after you are treated. GET HELP IF:  You have more fluid (discharge) coming from your vagina or fluid that is not normal.  Your pain does not improve.  You throw up (vomit).  You have a fever.  You cannot take your medicines.  Your partner has a sexually transmitted disease (STD).  You have pain when you pee (urinate). GET HELP RIGHT AWAY IF:  You have more belly (abdominal) or lower belly pain.  You have chills.  You are not better after 72 hours.   This information is not intended to replace advice given to you by your health care provider. Make sure you discuss any questions you have with your health care provider.   Document Released: 01/31/2009 Document Revised: 07/26/2015  Document Reviewed: 12/12/2014 Elsevier Interactive Patient Education [employer].

## 2015-11-03 NOTE — ED Provider Notes (Signed)
CSN: [id number]     Arrival date & time 11/03/15  1217 History  By signing my name below, I, Sarah Morris, attest that this documentation has been prepared under the direction and in the presence of Domenic Moras, PA-C. Electronically Signed: Starleen Morris ED Scribe. 11/03/2015. 1:52 PM.    Chief Complaint  Patient presents with  . Vaginal Bleeding  . Abdominal Pain   The history is provided by the patient. No language interpreter was used.   HPI Comments: Sarah Morris is a 34 y.o. female who presents to the Emergency Department complaining of vaginal spotting onset 2 days ago.  She notes associated 6/10, worse in the morning, left-sided abdominal pain that is worse with cough/urination; mild right flank pain; and "pressure" dysuria.  The patient was seen for the same on Wednesday by her PCP at which time she had a positive urine and blood pregnancy test.  She has not had a pelvic exam and was referred to ED for UTS because the clinic schedule was full.  The patient reports two prior pregnancies that resulted in elective abortion and ectopic pregnancy.  LNMP 08/2015 with light spotting 09/2015.  She reports hx of BV and UTI.  She notes increased urinary frequency but has consumed more water than normal. She was diagnosed with UTI and has been on keflex for the past 3 days with minimal improvement. She denies fever, lightheadedness, n/v.    Past Medical History  Diagnosis Date  . Ectopic pregnancy    History reviewed. No pertinent past surgical history. No family history on file. Social History  Substance Use Topics  . Smoking status: Current Every Day Smoker    Types: Cigarettes  . Smokeless tobacco: None  . Alcohol Use: Yes     Comment: occ   OB History    Gravida Para Term Preterm AB TAB SAB Ectopic Multiple Living   1              Review of Systems  Constitutional: Negative for fever.  Gastrointestinal: Positive for abdominal pain. Negative for nausea and vomiting.   Genitourinary: Positive for dysuria and vaginal bleeding.  Neurological: Negative for light-headedness.      Allergies  Food and Neosporin  Home Medications   Prior to Admission medications   Medication Sig Start Date End Date Taking? Authorizing Provider  Aspirin-Salicylamide-Caffeine (BC HEADACHE PO) Take 1 packet by mouth daily as needed (for pain).    Historical Provider, MD  ibuprofen (ADVIL,MOTRIN) 200 MG tablet Take 600 mg by mouth every 6 (six) hours as needed for moderate pain. For pain    Historical Provider, MD  meloxicam (MOBIC) 7.5 MG tablet Take 1 tablet (7.5 mg total) by mouth daily. 01/26/14   Marissa Sciacca, PA-C  methocarbamol (ROBAXIN) 500 MG tablet Take 1 tablet (500 mg total) by mouth 2 (two) times daily. 01/26/14   Marissa Sciacca, PA-C  oxyCODONE-acetaminophen (PERCOCET/ROXICET) 5-325 MG per tablet Take 1 tablet by mouth every 8 (eight) hours as needed for severe pain. 01/26/14   Marissa Sciacca, PA-C   BP 112/72 mmHg  Pulse 98  Temp(Src) 98.3 F (36.8 C) (Oral)  Resp 16  Ht 5\' 3"  (1.6 m)  Wt 117 lb 3.2 oz (53.162 kg)  BMI 20.77 kg/m2  SpO2 98%  LMP 09/13/2015 (Approximate) Physical Exam  Constitutional: She is oriented to person, place, and time. She appears well-developed and well-nourished. No distress.  HENT:  Head: Normocephalic and atraumatic.  Eyes: Conjunctivae and EOM are normal.  Neck: Neck supple. No tracheal deviation present.  Cardiovascular: Normal rate.   Pulmonary/Chest: Effort normal. No respiratory distress.  Abdominal: There is tenderness. There is no rebound and no guarding.  gravid abdomen.  Tenderness to LLQ on palpation without guarding or rebound tenderness.   Genitourinary:  Pelvic exam: RN in room as chaperone, external female genitalia normal with no signs of lesions or injuries. Speculum exam shows normal cervix with moderate white/yellow discharge. Bimanual exam with R adnexal tenderness, with cervical motion tenderness,  uterus normal size and nontender, no masses appreciated. The external cervical os is closed. Trace of blood noted at cervical os   Musculoskeletal: Normal range of motion.  Neurological: She is alert and oriented to person, place, and time.  Skin: Skin is warm and dry.  Psychiatric: She has a normal mood and affect. Her behavior is normal.  Nursing note and vitals reviewed.   ED Course  Procedures (including critical care time)  DIAGNOSTIC STUDIES: Oxygen Saturation is 98% on RA, normal by my interpretation.    COORDINATION OF CARE:  1:56 PM Discussed plan to order labs, UTS, and perform pelvic exam.  Patient acknowledges and agrees with plan.    Labs Review Labs Reviewed  CBC - Abnormal; Notable for the following:    RBC 3.40 (*)    Hemoglobin 11.7 (*)    HCT 34.6 (*)    MCV 101.8 (*)    MCH 34.4 (*)    All other components within normal limits  WET PREP, GENITAL  HCG, QUANTITATIVE, PREGNANCY  RPR  HIV ANTIBODY (ROUTINE TESTING)  RH IG WORKUP (INCLUDES ABO/RH)  GC/CHLAMYDIA PROBE AMP ([place]) NOT AT [facility]    Imaging Review No results found. I have personally reviewed and evaluated these images and lab results as part of my medical decision-making.   EKG Interpretation None      MDM   Final diagnoses:  PID (acute pelvic inflammatory disease)  Trichomoniasis  Threatened miscarriage    BP 112/72 mmHg  Pulse 98  Temp(Src) 98.3 F (36.8 C) (Oral)  Resp 16  Ht 5\' 3"  (1.6 m)  Wt 53.162 kg  BMI 20.77 kg/m2  SpO2 98%  LMP 09/13/2015 (Approximate)   I personally performed the services described in this documentation, which was scribed in my presence. The recorded information has been reviewed and is accurate.     2:01 PM Pregnant female here with abd pain and trace vaginal spotting.  sxs concerning for ectopic vs. Threatening miscarriage vs PID.  Work up initiated.  4:00 PM US demonstrated single living IUP measuring 9 weeks 5 days.  Wet prep with  many WBC and evidence of trichomonas.  HCG [id number].  Given these information, pt is likely having PID and potential threatening miscarriage but no evidence of ectopic pregnancy.  Pt voice disappointment that she does not have ectopic pregnancy.  I will provide abx as treatment and recommend pt to f/u with OBGYN for further care.  reeturn precaution discussed.  Low suspicion for appendicitis.    Domenic Moras, PA-C 11/03/15 [place], DO 11/04/15 [phone]

## 2015-11-03 NOTE — ED Notes (Signed)
Pt able to dress and ambulate independently.  Pt denies any n/v or itchiness/SOB after medication administration.  Pt provided with soda and food with antibiotics.

## 2015-11-03 NOTE — ED Notes (Addendum)
Pt has positive pregnancy test--started having pain in left lower abd-- went to private dr on Wednesday-- was treated for bacterial infection with antibiotics-- started spotting Wednesday and bleeding today. Cramping. Called dr today and was told to come here for ultrasound.  Pt has BHCG -- > 150, 000 also has order for ultrasound, but [facility] have any appt times for today.

## 2015-11-03 NOTE — ED Notes (Signed)
Back from US.

## 2015-11-04 LAB — RPR: RPR Ser Ql: NONREACTIVE

## 2015-11-04 LAB — HIV ANTIBODY (ROUTINE TESTING W REFLEX): HIV Screen 4th Generation wRfx: NONREACTIVE

## 2015-11-06 LAB — GC/CHLAMYDIA PROBE AMP (~~LOC~~) NOT AT ARMC
Chlamydia: NEGATIVE
NEISSERIA GONORRHEA: NEGATIVE

## 2016-09-17 ENCOUNTER — Encounter (HOSPITAL_COMMUNITY): Payer: Self-pay | Admitting: *Deleted

## 2016-09-17 ENCOUNTER — Ambulatory Visit (INDEPENDENT_AMBULATORY_CARE_PROVIDER_SITE_OTHER)
Admission: EM | Admit: 2016-09-17 | Discharge: 2016-09-17 | Disposition: A | Discharge status: Home / Self Care | Payer: BLUE CROSS/BLUE SHIELD | Source: Home / Self Care | Attending: Emergency Medicine | Admitting: Emergency Medicine

## 2016-09-17 ENCOUNTER — Inpatient Hospital Stay (HOSPITAL_COMMUNITY)
Admission: AD | Admit: 2016-09-17 | Discharge: 2016-09-17 | Disposition: A | Discharge status: Home / Self Care | Payer: BLUE CROSS/BLUE SHIELD | Source: Ambulatory Visit | Attending: Family Medicine | Admitting: Family Medicine

## 2016-09-17 ENCOUNTER — Encounter (HOSPITAL_COMMUNITY): Payer: Self-pay | Admitting: Emergency Medicine

## 2016-09-17 ENCOUNTER — Inpatient Hospital Stay (HOSPITAL_COMMUNITY): Payer: BLUE CROSS/BLUE SHIELD

## 2016-09-17 DIAGNOSIS — R109 Unspecified abdominal pain: Secondary | ICD-10-CM

## 2016-09-17 DIAGNOSIS — O99331 Smoking (tobacco) complicating pregnancy, first trimester: Secondary | ICD-10-CM | POA: Insufficient documentation

## 2016-09-17 DIAGNOSIS — O09521 Supervision of elderly multigravida, first trimester: Secondary | ICD-10-CM

## 2016-09-17 DIAGNOSIS — O23591 Infection of other part of genital tract in pregnancy, first trimester: Secondary | ICD-10-CM | POA: Diagnosis not present

## 2016-09-17 DIAGNOSIS — R300 Dysuria: Secondary | ICD-10-CM | POA: Diagnosis not present

## 2016-09-17 DIAGNOSIS — A599 Trichomoniasis, unspecified: Secondary | ICD-10-CM | POA: Diagnosis not present

## 2016-09-17 DIAGNOSIS — O26891 Other specified pregnancy related conditions, first trimester: Secondary | ICD-10-CM

## 2016-09-17 DIAGNOSIS — Z3A01 Less than 8 weeks gestation of pregnancy: Secondary | ICD-10-CM | POA: Insufficient documentation

## 2016-09-17 DIAGNOSIS — O98311 Other infections with a predominantly sexual mode of transmission complicating pregnancy, first trimester: Secondary | ICD-10-CM

## 2016-09-17 DIAGNOSIS — B9689 Other specified bacterial agents as the cause of diseases classified elsewhere: Secondary | ICD-10-CM | POA: Diagnosis not present

## 2016-09-17 DIAGNOSIS — F17210 Nicotine dependence, cigarettes, uncomplicated: Secondary | ICD-10-CM

## 2016-09-17 DIAGNOSIS — Z3A08 8 weeks gestation of pregnancy: Secondary | ICD-10-CM | POA: Diagnosis not present

## 2016-09-17 DIAGNOSIS — O2301 Infections of kidney in pregnancy, first trimester: Secondary | ICD-10-CM | POA: Diagnosis not present

## 2016-09-17 DIAGNOSIS — A5901 Trichomonal vulvovaginitis: Secondary | ICD-10-CM

## 2016-09-17 DIAGNOSIS — N760 Acute vaginitis: Secondary | ICD-10-CM

## 2016-09-17 DIAGNOSIS — O26899 Other specified pregnancy related conditions, unspecified trimester: Secondary | ICD-10-CM

## 2016-09-17 LAB — CBC
HCT: 38.2 % (ref 36.0–46.0)
Hemoglobin: 13.2 g/dL (ref 12.0–15.0)
MCH: 34.6 pg — ABNORMAL HIGH (ref 26.0–34.0)
MCHC: 34.6 g/dL (ref 30.0–36.0)
MCV: 100.3 fL — ABNORMAL HIGH (ref 78.0–100.0)
PLATELETS: 266 10*3/uL (ref 150–400)
RBC: 3.81 MIL/uL — ABNORMAL LOW (ref 3.87–5.11)
RDW: 12.4 % (ref 11.5–15.5)
WBC: 7.8 10*3/uL (ref 4.0–10.5)

## 2016-09-17 LAB — POCT URINALYSIS DIP (DEVICE)
BILIRUBIN URINE: NEGATIVE
Glucose, UA: NEGATIVE mg/dL
Ketones, ur: NEGATIVE mg/dL
Nitrite: NEGATIVE
PH: 7 (ref 5.0–8.0)
Protein, ur: NEGATIVE mg/dL
SPECIFIC GRAVITY, URINE: 1.02 (ref 1.005–1.030)
Urobilinogen, UA: 0.2 mg/dL (ref 0.0–1.0)

## 2016-09-17 LAB — WET PREP, GENITAL
Sperm: NONE SEEN
YEAST WET PREP: NONE SEEN

## 2016-09-17 LAB — HCG, QUANTITATIVE, PREGNANCY: HCG, BETA CHAIN, QUANT, S: 37229 m[IU]/mL — AB (ref <5)

## 2016-09-17 LAB — POCT PREGNANCY, URINE: Preg Test, Ur: POSITIVE — AB

## 2016-09-17 MED ORDER — CEPHALEXIN 500 MG PO CAPS: 500.0000 mg | ORAL_CAPSULE | Freq: Four times a day (QID) | ORAL | 0 refills | Status: DC

## 2016-09-17 MED ORDER — CEFTRIAXONE SODIUM 1 G IJ SOLR
1.0000 g | Freq: Once | INTRAMUSCULAR | Status: AC
  Administered 2016-09-17: 1 g via INTRAMUSCULAR

## 2016-09-17 MED ORDER — LIDOCAINE HCL (PF) 1 % IJ SOLN
INTRAMUSCULAR | Status: AC
  Filled 2016-09-17: qty 2

## 2016-09-17 MED ORDER — PROMETHAZINE HCL 25 MG PO TABS: 25.0000 mg | ORAL_TABLET | Freq: Four times a day (QID) | ORAL | 0 refills | Status: DC | PRN

## 2016-09-17 MED ORDER — CEFTRIAXONE SODIUM 1 G IJ SOLR
INTRAMUSCULAR | Status: AC
  Filled 2016-09-17: qty 10

## 2016-09-17 MED ORDER — METRONIDAZOLE 500 MG PO TABS: 500.0000 mg | ORAL_TABLET | Freq: Two times a day (BID) | ORAL | 0 refills | Status: DC

## 2016-09-17 NOTE — MAU Provider Note (Signed)
History     CSN: [id number]  Arrival date and time: 09/17/16 1556   None     Chief Complaint  Patient presents with  . Abdominal Pain  . Vaginal Discharge   HPI  Pt is 35 y.o.[redacted]w[redacted]d [id number] who presents from [facility] with abd pain in pregnancy and some bleeding.  Pt has hx of ectopic pregnancy x2 and concern for ectopic pregnancy. Pt previously evaluated and [facility] with pending GC/Chlamydia and AFFIRM.  Pt concerned she may have a vaginal infection and wants another swab here so she can go ahead and be treated, since results are not ready from [place]. RN note: MAU Note Date of Service: 09/17/2016 4:21 PM Jolynn K Spurlock-Frizzell, RN    [] Hide copied text [] Hover for attribution information Been having d/c. Just found out preg. Sent from office to confirm location. ? md felt more on one side, that is the area where she has been hurting . (left side). Pain started about a wk ago.   Hx of 2 prior ectopic preg.    [place]: She is a 36 year old woman here for evaluation of abdominal pain and dysuria. She states her symptoms started about a week ago that have gotten worse in the last 3-4 days. She reports pain across her lower abdomen as well as in the left back. She reports dysuria, urgency, and frequency. She also reports a small amount of vaginal discharge that started today. She has had nausea and vomiting in the last days as well.  Her last menstrual period was about a month ago, but she states it was much shorter than usual. She is sexually active with a known partner. They stopped using condoms about 4 months ago. 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 Ectopic pregnancy     No past surgical history on file.  No family history on file.  Social History  Substance Use Topics  . Smoking status: Current Every Day Smoker    Types: Cigarettes  . Smokeless tobacco: Never Used  . Alcohol use Yes     Comment:  occ    Allergies:  Allergies  Allergen Reactions  . Food Swelling    Black pepper  . Neosporin [Neomycin-Bacitracin Zn-Polymyx] Rash    Prescriptions Prior to Admission  Medication Sig Dispense Refill Last Dose  . Aspirin-Salicylamide-Caffeine (BC HEADACHE PO) Take 1 packet by mouth daily as needed (for pain).   01/26/2014 at Unknown time  . cephALEXin (KEFLEX) 500 MG capsule Take 1 capsule (500 mg total) by mouth 4 (four) times daily. 28 capsule 0   . ibuprofen (ADVIL,MOTRIN) 200 MG tablet Take 600 mg by mouth every 6 (six) hours as needed for moderate pain. For pain   09/16/2016 at Unknown time  . meloxicam (MOBIC) 7.5 MG tablet Take 1 tablet (7.5 mg total) by mouth daily. 15 tablet 0   . methocarbamol (ROBAXIN) 500 MG tablet Take 1 tablet (500 mg total) by mouth 2 (two) times daily. 20 tablet 0   . oxyCODONE-acetaminophen (PERCOCET/ROXICET) 5-325 MG per tablet Take 1 tablet by mouth every 8 (eight) hours as needed for severe pain. 11 tablet 0     Review of Systems  Constitutional: Negative for chills and fever.  Gastrointestinal: Positive for abdominal pain, nausea and vomiting.  Genitourinary: Negative for dysuria.  Neurological: Negative for dizziness.   Physical Exam   Blood pressure 108/66, pulse 79, temperature 98.4 F (36.9 C), temperature  source Oral, resp. rate 17, height 5' 2.99" (1.6 m), weight 101 lb 1.9 oz (45.9 kg), last menstrual period 08/15/2016, SpO2 100 %.  Physical Exam  Vitals reviewed. Constitutional: She is oriented to person, place, and time. She appears well-developed and well-nourished.  HENT:  Head: Normocephalic.  Eyes: Pupils are equal, round, and reactive to light.  Neck: Normal range of motion. Neck supple.  Cardiovascular: Normal rate.   Respiratory: Effort normal.  GI: Soft. She exhibits no distension. There is no tenderness. There is no rebound and no guarding.  Musculoskeletal: Normal range of motion.  Neurological: She is alert and  oriented to person, place, and time.  Skin: Skin is warm and dry.  Psychiatric: She has a normal mood and affect.    MAU Course  Procedures Results for orders placed or performed during the hospital encounter of 09/17/16 (from the past 24 hour(s))  Wet prep, genital     Status: Abnormal   Collection Time: 09/17/16  4:40 PM  Result Value Ref Range   Yeast Wet Prep HPF POC NONE SEEN NONE SEEN   Trich, Wet Prep PRESENT (A) NONE SEEN   Clue Cells Wet Prep HPF POC PRESENT (A) NONE SEEN   WBC, Wet Prep HPF POC FEW (A) NONE SEEN   Sperm NONE SEEN   CBC     Status: Abnormal   Collection Time: 09/17/16  4:48 PM  Result Value Ref Range   WBC 7.8 4.0 - 10.5 K/uL   RBC 3.81 (L) 3.87 - 5.11 MIL/uL   Hemoglobin 13.2 12.0 - 15.0 g/dL   HCT 38.2 36.0 - 46.0 %   MCV 100.3 (H) 78.0 - 100.0 fL   MCH 34.6 (H) 26.0 - 34.0 pg   MCHC 34.6 30.0 - 36.0 g/dL   RDW 12.4 11.5 - 15.5 %   Platelets 266 150 - 400 K/uL  hCG, quantitative, pregnancy     Status: Abnormal   Collection Time: 09/17/16  4:48 PM  Result Value Ref Range   hCG, Beta Chain, Quant, S 37,229 (H) <5 mIU/mL   US Ob Comp Less 14 Wks  Result Date: 09/17/2016 CLINICAL DATA:  Left lower quadrant pain EXAM: OBSTETRIC <14 WK [place] AND TRANSVAGINAL OB US TECHNIQUE: Both transabdominal and transvaginal ultrasound examinations were performed for complete evaluation of the gestation as well as the maternal uterus, adnexal regions, and pelvic cul-de-sac. Transvaginal technique was performed to assess early pregnancy. COMPARISON:  None. FINDINGS: Intrauterine gestational sac: Single gestational sac present. Yolk sac:  Visualized. Embryo:  Visualized. Cardiac Activity: Present Heart Rate: 96  bpm CRL:  2  mm   5 w   5 d                  [place] EDC: 05/15/2017 Subchorionic hemorrhage:  None visualized. Maternal uterus/adnexae: Uterus is grossly unremarkable. Bilateral ovaries are within normal limits. Right ovary measures 2.7 x 1.2 x 1.6 cm. Left ovary measures  3.5 x 1.9 x 2.3 cm. 1.6 x 1.1 x 1 point 6 cm complicated cysts left ovary, possible hemorrhagic corpus luteal cyst. Trace amount of free pelvic fluid. IMPRESSION: 1. Single intrauterine gestation. Tiny fetal pole identified with low-normal cardiac activity of 96 beats per minute. 2. Probable hemorrhagic corpus luteal cyst in left ovary. 3. Trace amount of free pelvic fluid. Electronically Signed   By: Donavan Foil M.D.   On: 09/17/2016 17:51   US Ob Transvaginal  Result Date: 09/17/2016 CLINICAL DATA:  Left lower quadrant pain EXAM:  OBSTETRIC <14 WK [place] AND TRANSVAGINAL OB US TECHNIQUE: Both transabdominal and transvaginal ultrasound examinations were performed for complete evaluation of the gestation as well as the maternal uterus, adnexal regions, and pelvic cul-de-sac. Transvaginal technique was performed to assess early pregnancy. COMPARISON:  None. FINDINGS: Intrauterine gestational sac: Single gestational sac present. Yolk sac:  Visualized. Embryo:  Visualized. Cardiac Activity: Present Heart Rate: 96  bpm CRL:  2  mm   5 w   5 d                  [place] EDC: 05/15/2017 Subchorionic hemorrhage:  None visualized. Maternal uterus/adnexae: Uterus is grossly unremarkable. Bilateral ovaries are within normal limits. Right ovary measures 2.7 x 1.2 x 1.6 cm. Left ovary measures 3.5 x 1.9 x 2.3 cm. 1.6 x 1.1 x 1 point 6 cm complicated cysts left ovary, possible hemorrhagic corpus luteal cyst. Trace amount of free pelvic fluid. IMPRESSION: 1. Single intrauterine gestation. Tiny fetal pole identified with low-normal cardiac activity of 96 beats per minute. 2. Probable hemorrhagic corpus luteal cyst in left ovary. 3. Trace amount of free pelvic fluid. Electronically Signed   By: Donavan Foil M.D.   On: 09/17/2016 17:51  reviewed ultrasound with pt  Assessment and Plan  Abdominal pain in pregnancy Single  LIUP- with IUP [redacted]w[redacted]d with EDC Trichomonas and bacterial vaginosis- flagyl 500mg  BID - partner to get  treatment Proceed with OB care; return for increase in pain or bleeding GC/chlamydia pending  Artie Takayama 09/17/2016, 4:32 PM

## 2016-09-17 NOTE — Discharge Instructions (Signed)
Abdominal Pain During Pregnancy Abdominal pain is common in pregnancy. Most of the time, it does not cause harm. There are many causes of abdominal pain. Some causes are more serious than others. Some of the causes of abdominal pain in pregnancy are easily diagnosed. Occasionally, the diagnosis takes time to understand. Other times, the cause is not determined. Abdominal pain can be a sign that something is very wrong with the pregnancy, or the pain may have nothing to do with the pregnancy at all. For this reason, always tell your health care provider if you have any abdominal discomfort. HOME CARE INSTRUCTIONS  Monitor your abdominal pain for any changes. The following actions may help to alleviate any discomfort you are experiencing:  Do not have sexual intercourse or put anything in your vagina until your symptoms go away completely.  Get plenty of rest until your pain improves.  Drink clear fluids if you feel nauseous. Avoid solid food as long as you are uncomfortable or nauseous.  Only take over-the-counter or prescription medicine as directed by your health care provider.  Keep all follow-up appointments with your health care provider. SEEK IMMEDIATE MEDICAL CARE IF:  You are bleeding, leaking fluid, or passing tissue from the vagina.  You have increasing pain or cramping.  You have persistent vomiting.  You have painful or bloody urination.  You have a fever.  You notice a decrease in your baby's movements.  You have extreme weakness or feel faint.  You have shortness of breath, with or without abdominal pain.  You develop a severe headache with abdominal pain.  You have abnormal vaginal discharge with abdominal pain.  You have persistent diarrhea.  You have abdominal pain that continues even after rest, or gets worse. MAKE SURE YOU:   Understand these instructions.  Will watch your condition.  Will get help right away if you are not doing well or get worse.     This information is not intended to replace advice given to you by your health care provider. Make sure you discuss any questions you have with your health care provider.   Document Released: 11/04/2005 Document Revised: 08/25/2013 Document Reviewed: 06/03/2013 Elsevier Interactive Patient Education 2016 [place] Medications in Pregnancy   Acne: Benzoyl Peroxide Salicylic Acid  Backache/Headache: Tylenol: 2 regular strength every 4 hours OR              2 Extra strength every 6 hours  Colds/Coughs/Allergies: Benadryl (alcohol free) 25 mg every 6 hours as needed Breath right strips Claritin Cepacol throat lozenges Chloraseptic throat spray Cold-Eeze- up to three times per day Cough drops, alcohol free Flonase (by prescription only) Guaifenesin Mucinex Robitussin DM (plain only, alcohol free) Saline nasal spray/drops Sudafed (pseudoephedrine) & Actifed ** use only after [redacted] weeks gestation and if you do not have high blood pressure Tylenol Vicks Vaporub Zinc lozenges Zyrtec   Constipation: Colace Ducolax suppositories Fleet enema Glycerin suppositories Metamucil Milk of magnesia Miralax Senokot Smooth move tea  Diarrhea: Kaopectate Imodium A-D  *NO pepto Bismol  Hemorrhoids: Anusol Anusol HC Preparation H Tucks  Indigestion: Tums Maalox Mylanta Zantac  Pepcid  Insomnia: Benadryl (alcohol free) 25mg  every 6 hours as needed Tylenol PM Unisom, no Gelcaps  Leg Cramps: Tums MagGel  Nausea/Vomiting:  Bonine Dramamine Emetrol Ginger extract Sea bands Meclizine  Nausea medication to take during pregnancy:  Unisom (doxylamine succinate 25 mg tablets) Take one tablet daily at bedtime. If symptoms are not adequately controlled, the dose can be increased to  a maximum recommended dose of two tablets daily (1/2 tablet in the morning, 1/2 tablet mid-afternoon and one at bedtime). Vitamin B6 100mg  tablets. Take one tablet twice a day (up to  200 mg per day).  Skin Rashes: Aveeno products Benadryl cream or 25mg  every 6 hours as needed Calamine Lotion 1% cortisone cream  Yeast infection: Gyne-lotrimin 7 Monistat 7   **If taking multiple medications, please check labels to avoid duplicating the same active ingredients **take medication as directed on the label ** Do not exceed 4000 mg of tylenol in 24 hours **Do not take medications that contain aspirin or ibuprofen

## 2016-09-17 NOTE — ED Provider Notes (Signed)
[place]    CSN: [id number] Arrival date & time: 09/17/16  1415     History   Chief Complaint Chief Complaint  Patient presents with  . Abdominal Pain  . Dysuria    HPI Sarah Morris is a 35 y.o. female.   HPI  She is a 35 year old woman here for evaluation of abdominal pain and dysuria. She states her symptoms started about a week ago that have gotten worse in the last 3-4 days. She reports pain across her lower abdomen as well as in the left back. She reports dysuria, urgency, and frequency. She also reports a small amount of vaginal discharge that started today. She has had nausea and vomiting in the last days as well.  Her last menstrual period was about a month ago, but she states it was much shorter than usual. She is sexually active with a known partner. They stopped using condoms about 4 months ago. 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  Past Medical History:  Diagnosis Date  . Ectopic pregnancy     There are no active problems to display for this patient.   History reviewed. No pertinent surgical history.  OB History    Gravida Para Term Preterm AB Living   1             SAB TAB Ectopic Multiple Live Births                   Home Medications    Prior to Admission medications   Medication Sig Start Date End Date Taking? Authorizing Provider  ibuprofen (ADVIL,MOTRIN) 200 MG tablet Take 600 mg by mouth every 6 (six) hours as needed for moderate pain. For pain   Yes Historical Provider, MD  Aspirin-Salicylamide-Caffeine (BC HEADACHE PO) Take 1 packet by mouth daily as needed (for pain).    Historical Provider, MD  cephALEXin (KEFLEX) 500 MG capsule Take 1 capsule (500 mg total) by mouth 4 (four) times daily. 09/17/16   Melony Overly, MD  meloxicam (MOBIC) 7.5 MG tablet Take 1 tablet (7.5 mg total) by mouth daily. 01/26/14   Marissa Sciacca, PA-C  methocarbamol (ROBAXIN) 500 MG tablet Take 1 tablet (500 mg total) by mouth 2 (two)  times daily. 01/26/14   Marissa Sciacca, PA-C  oxyCODONE-acetaminophen (PERCOCET/ROXICET) 5-325 MG per tablet Take 1 tablet by mouth every 8 (eight) hours as needed for severe pain. 01/26/14   Marissa Sciacca, PA-C    Family History History reviewed. No pertinent family history.  Social History Social History  Substance Use Topics  . Smoking status: Current Every Day Smoker    Types: Cigarettes  . Smokeless tobacco: Never Used  . Alcohol use Yes     Comment: occ     Allergies   Food and Neosporin [neomycin-bacitracin zn-polymyx]   Review of Systems Review of Systems   Physical Exam Triage Vital Signs ED Triage Vitals [09/17/16 1437]  Enc Vitals Group     BP 125/80     Pulse Rate 86     Resp 12     Temp 99 F (37.2 C)     Temp Source Oral     SpO2 100 %     Weight      Height      Head Circumference      Peak Flow      Pain Score 6     Pain Loc      Pain Edu?  Excl. in [place]?    No data found.   Updated Vital Signs BP 125/80 (BP Location: Left Arm)   Pulse 86   Temp 99 F (37.2 C) (Oral)   Resp 12   LMP 08/20/2015 (Exact Date)   SpO2 100%   Breastfeeding? No   Visual Acuity Right Eye Distance:   Left Eye Distance:   Bilateral Distance:    Right Eye Near:   Left Eye Near:    Bilateral Near:     Physical Exam  Constitutional: She is oriented to person, place, and time. She appears well-developed and well-nourished. No distress.  Cardiovascular: Normal rate and regular rhythm.   No murmur heard. Pulmonary/Chest: Effort normal and breath sounds normal. She has no wheezes. She has no rales.  Abdominal: Soft. She exhibits no distension. There is tenderness (suprapubic and left lower quadrant). There is no rebound and no guarding.  Positive left-sided CVA tenderness  Genitourinary: There is no rash on the right labia. There is no rash on the left labia. Uterus is tender. Cervix exhibits no motion tenderness. Left adnexum displays tenderness and  fullness. No bleeding in the vagina. No vaginal discharge found.  Neurological: She is alert and oriented to person, place, and time.     UC Treatments / Results  Labs (all labs ordered are listed, but only abnormal results are displayed) Labs Reviewed  POCT URINALYSIS DIP (DEVICE) - Abnormal; Notable for the following:       Result Value   Hgb urine dipstick TRACE (*)    Leukocytes, UA TRACE (*)    All other components within normal limits  POCT PREGNANCY, URINE - Abnormal; Notable for the following:    Preg Test, Ur POSITIVE (*)    All other components within normal limits  URINE CULTURE  CERVICOVAGINAL ANCILLARY ONLY    EKG  EKG Interpretation None       Radiology No results found.  Procedures Procedures (including critical care time)  Medications Ordered in UC Medications  cefTRIAXone (ROCEPHIN) injection 1 g (1 g Intramuscular Given 09/17/16 1526)     Initial Impression / Assessment and Plan / UC Course  I have reviewed the triage vital signs and the nursing notes.  Pertinent labs & imaging results that were available during my care of the patient were reviewed by me and considered in my medical decision making (see chart for details).  Clinical Course    History concerning for ectopic pregnancy. I'm also concerned for pyelonephritis given CVA tenderness and UA. 1 g of rocephin given here. Prescription for Keflex sent to her pharmacy. Emphasized importance of following up at [facility] today to confirm the location of her pregnancy.  Final Clinical Impressions(s) / UC Diagnoses   Final diagnoses:  Pyelonephritis affecting pregnancy in first trimester  [redacted] weeks gestation of pregnancy    New Prescriptions New Prescriptions   CEPHALEXIN (KEFLEX) 500 MG CAPSULE    Take 1 capsule (500 mg total) by mouth 4 (four) times daily.     Melony Overly, MD 09/17/16 [id number]

## 2016-09-17 NOTE — MAU Note (Signed)
Urine sent to lab 

## 2016-09-17 NOTE — ED Triage Notes (Signed)
The patient presented to the [facility] with a complaint of abdominal pain and lower back pain for 4 days and dysuria and urinary frequency x 2 days. The patient also reported N/V the last 2 days as well.

## 2016-09-17 NOTE — MAU Note (Signed)
Been having d/c. Just found out preg. Sent from office to confirm location. ? md felt more on one side, that is the area where she has been hurting . (left side). Pain started about a wk ago.   Hx of 2 prior ectopic preg.

## 2016-09-17 NOTE — Discharge Instructions (Signed)
We gave you a shot of antibiotic today to treat a likely kidney infection. Take the Keflex as prescribed, starting tomorrow. We collected swabs for STD testing. The results will be back in 2-3 days.  Please go directly to [facility] pregnancy test is positive and with your history, we need to confirm it is in the right location.

## 2016-09-18 LAB — URINE CULTURE

## 2016-09-18 LAB — CERVICOVAGINAL ANCILLARY ONLY
CHLAMYDIA, DNA PROBE: NEGATIVE
NEISSERIA GONORRHEA: NEGATIVE
Wet Prep (BD Affirm): POSITIVE — AB

## 2017-06-06 ENCOUNTER — Ambulatory Visit (HOSPITAL_COMMUNITY)
Admission: EM | Admit: 2017-06-06 | Discharge: 2017-06-06 | Disposition: A | Discharge status: Home / Self Care | Payer: BLUE CROSS/BLUE SHIELD | Attending: Family | Admitting: Family

## 2017-06-06 ENCOUNTER — Encounter (HOSPITAL_COMMUNITY): Payer: Self-pay | Admitting: Emergency Medicine

## 2017-06-06 DIAGNOSIS — Z3202 Encounter for pregnancy test, result negative: Secondary | ICD-10-CM | POA: Diagnosis not present

## 2017-06-06 DIAGNOSIS — F17210 Nicotine dependence, cigarettes, uncomplicated: Secondary | ICD-10-CM | POA: Diagnosis not present

## 2017-06-06 DIAGNOSIS — N938 Other specified abnormal uterine and vaginal bleeding: Secondary | ICD-10-CM

## 2017-06-06 DIAGNOSIS — N730 Acute parametritis and pelvic cellulitis: Secondary | ICD-10-CM

## 2017-06-06 DIAGNOSIS — N760 Acute vaginitis: Secondary | ICD-10-CM

## 2017-06-06 DIAGNOSIS — N939 Abnormal uterine and vaginal bleeding, unspecified: Secondary | ICD-10-CM

## 2017-06-06 DIAGNOSIS — B9689 Other specified bacterial agents as the cause of diseases classified elsewhere: Secondary | ICD-10-CM

## 2017-06-06 DIAGNOSIS — N739 Female pelvic inflammatory disease, unspecified: Secondary | ICD-10-CM

## 2017-06-06 LAB — POCT URINALYSIS DIP (DEVICE)
BILIRUBIN URINE: NEGATIVE
GLUCOSE, UA: NEGATIVE mg/dL
KETONES UR: NEGATIVE mg/dL
Leukocytes, UA: NEGATIVE
Nitrite: NEGATIVE
PROTEIN: NEGATIVE mg/dL
Specific Gravity, Urine: 1.01 (ref 1.005–1.030)
Urobilinogen, UA: 0.2 mg/dL (ref 0.0–1.0)
pH: 6 (ref 5.0–8.0)

## 2017-06-06 LAB — CBC WITH DIFFERENTIAL/PLATELET
BASOS PCT: 0 %
Basophils Absolute: 0 10*3/uL (ref 0.0–0.1)
EOS ABS: 0.3 10*3/uL (ref 0.0–0.7)
Eosinophils Relative: 3 %
HCT: 40 % (ref 36.0–46.0)
HEMOGLOBIN: 13.2 g/dL (ref 12.0–15.0)
Lymphocytes Relative: 17 %
Lymphs Abs: 1.5 10*3/uL (ref 0.7–4.0)
MCH: 33.8 pg (ref 26.0–34.0)
MCHC: 33 g/dL (ref 30.0–36.0)
MCV: 102.3 fL — ABNORMAL HIGH (ref 78.0–100.0)
Monocytes Absolute: 0.6 10*3/uL (ref 0.1–1.0)
Monocytes Relative: 6 %
NEUTROS PCT: 74 %
Neutro Abs: 6.7 10*3/uL (ref 1.7–7.7)
Platelets: 278 10*3/uL (ref 150–400)
RBC: 3.91 MIL/uL (ref 3.87–5.11)
RDW: 12.4 % (ref 11.5–15.5)
WBC: 9.1 10*3/uL (ref 4.0–10.5)

## 2017-06-06 LAB — HCG, QUANTITATIVE, PREGNANCY

## 2017-06-06 LAB — POCT PREGNANCY, URINE: PREG TEST UR: NEGATIVE

## 2017-06-06 MED ORDER — METRONIDAZOLE 500 MG PO TABS: 500.0000 mg | ORAL_TABLET | Freq: Two times a day (BID) | ORAL | 0 refills | Status: DC

## 2017-06-06 MED ORDER — METRONIDAZOLE 0.75 % VA GEL: 1.0000 | Freq: Every day | VAGINAL | 0 refills | Status: DC

## 2017-06-06 MED ORDER — CEFTRIAXONE SODIUM 250 MG IJ SOLR
INTRAMUSCULAR | Status: AC
  Filled 2017-06-06: qty 250

## 2017-06-06 MED ORDER — CEFTRIAXONE SODIUM 250 MG IJ SOLR
250.0000 mg | Freq: Once | INTRAMUSCULAR | Status: AC
  Administered 2017-06-06: 250 mg via INTRAMUSCULAR

## 2017-06-06 MED ORDER — AZITHROMYCIN 250 MG PO TABS
1000.0000 mg | ORAL_TABLET | Freq: Once | ORAL | Status: AC
  Administered 2017-06-06: 1000 mg via ORAL

## 2017-06-06 MED ORDER — STERILE WATER FOR INJECTION IJ SOLN
INTRAMUSCULAR | Status: AC
  Filled 2017-06-06: qty 10

## 2017-06-06 MED ORDER — AZITHROMYCIN 250 MG PO TABS
ORAL_TABLET | ORAL | Status: AC
  Filled 2017-06-06: qty 4

## 2017-06-06 NOTE — ED Triage Notes (Signed)
PT reports she had a normal 5 day period and the bleeding never stopped. PT has been bleeding for 11 days. Bleeding has lessened, but it still present. PT reports she has taken 2 morning after pills over the last month. PT reports abdominal pain and pelvic pain. PT reports she has an odor now as well. PT reports burning with urination. PT reports history of PID

## 2017-06-06 NOTE — ED Provider Notes (Signed)
CSN: [id number]     Arrival date & time 06/06/17  1056 History   First MD Initiated Contact with Patient 06/06/17 1124     Chief Complaint  Patient presents with  . Vaginal Bleeding   (Consider location/radiation/quality/duration/timing/severity/associated sxs/prior Treatment) Chief complaint of scant, dark red vaginal bleeding for 11 days which has improved over past couple of days. Wearing light pads.  No dizziness, syncope.She also reports that she's taken to morning after pills in the past month. She states she also takes oral birth control ( doesn't recall name) as well for last 2 months, consistent. history of 3 ectopic pregnancies. Mild cramping.   She also complains of suprapubic pain 3 days ago, unchanged. Thinks that her 'BV" has come back. Would like stronger medication. Cycle regular , usually 4-6 days. She states feel 'similar' as it did back in October when she is evaluated for PID.  No fever, chills, nausea, vomiting, sob, leg swelling, urinary frequency.   She reports the same sexual partner. She also notes without odor and vaginal discharge. No dysuria       Last seen in emergency room 08/2016 pyelonephritis, concern for ectopic. At times abdominal pain, dysuria.  History of ectopic pregnancy   Past Medical History:  Diagnosis Date  . Ectopic pregnancy    Past Surgical History:  Procedure Laterality Date  . DILATION AND CURETTAGE OF UTERUS     No family history on file. Social History  Substance Use Topics  . Smoking status: Current Every Day Smoker    Types: Cigarettes  . Smokeless tobacco: Never Used  . Alcohol use Yes     Comment: occ   OB History    Gravida Para Term Preterm AB Living   3       2     SAB TAB Ectopic Multiple Live Births       2         Review of Systems  Constitutional: Negative for chills and fever.  Respiratory: Negative for cough and shortness of breath.   Cardiovascular: Negative for chest pain and palpitations.   Gastrointestinal: Positive for abdominal pain. Negative for nausea and vomiting.  Genitourinary: Positive for menstrual problem, pelvic pain, vaginal bleeding and vaginal discharge. Negative for difficulty urinating, dysuria, flank pain and hematuria.  Neurological: Negative for dizziness and syncope.    Allergies  Food and Neosporin [neomycin-bacitracin zn-polymyx]  Home Medications   Prior to Admission medications   Medication Sig Start Date End Date Taking? Authorizing Provider  metroNIDAZOLE (FLAGYL) 500 MG tablet Take 1 tablet (500 mg total) by mouth 2 (two) times daily. 09/17/16   West Pugh, NP  metroNIDAZOLE (FLAGYL) 500 MG tablet Take 1 tablet (500 mg total) by mouth 2 (two) times daily. 06/06/17   Burnard Hawthorne, FNP  metroNIDAZOLE (METROGEL) 0.75 % vaginal gel Place 1 Applicatorful vaginally daily. 06/06/17 06/11/17  Burnard Hawthorne, FNP   Meds Ordered and Administered this Visit   Medications  cefTRIAXone (ROCEPHIN) injection 250 mg (not administered)  azithromycin (ZITHROMAX) tablet 1,000 mg (not administered)    BP 129/88   Pulse 87   Temp 99 F (37.2 C) (Oral)   Resp 16   LMP 05/27/2017   SpO2 98%   Breastfeeding? Unknown  No data found.   Physical Exam  Constitutional: She appears well-developed and well-nourished.  Eyes: Conjunctivae are normal.  Cardiovascular: Normal rate, regular rhythm, normal heart sounds and normal pulses.   Pulmonary/Chest: Effort normal and breath sounds normal.  She has no wheezes. She has no rhonchi. She has no rales.  Abdominal: Soft. Normal appearance and bowel sounds are normal. She exhibits no distension, no fluid wave, no ascites and no mass. There is tenderness (mild suprapubic tenderness). There is no rigidity, no rebound, no guarding and no CVA tenderness.  Genitourinary: There is no rash, tenderness or lesion on the right labia. There is no rash, tenderness or lesion on the left labia. Cervix exhibits no motion  tenderness and no discharge. Right adnexum displays no mass, no tenderness and no fullness. Left adnexum displays no mass, no tenderness and no fullness. There is bleeding in the vagina. No erythema or tenderness in the vagina. No foreign body in the vagina. No vaginal discharge found.  Genitourinary Comments: No vulvovaginal erythema. No lesions. Abundant amount of blood seen in vaginal cavity coming from os.  No CMT.     Neurological: She is alert.  Skin: Skin is warm and dry.  Psychiatric: She has a normal mood and affect. Her speech is normal and behavior is normal. Thought content normal.  Vitals reviewed.   Urgent Care Course     Procedures (including critical care time)  Labs Review Labs Reviewed  CBC WITH DIFFERENTIAL/PLATELET - Abnormal; Notable for the following:       Result Value   MCV 102.3 (*)    All other components within normal limits  POCT URINALYSIS DIP (DEVICE) - Abnormal; Notable for the following:    Hgb urine dipstick LARGE (*)    All other components within normal limits  HCG, QUANTITATIVE, PREGNANCY  POCT PREGNANCY, URINE  CERVICOVAGINAL ANCILLARY ONLY       MDM   1. BV (bacterial vaginosis)   2. Abnormal uterine bleeding   3. PID (acute pelvic inflammatory disease)    Patient and I jointly agreed to go ahead and treat empirically for bacterial vaginosis, trichomonas, chlamydia and gonorrhea. Suspect abnormal uterine bleeding may be related to taking oral contraceptives and also 2 additional doses of the emergency contraceptive. Pending CBC however patient has not dizzy, short of breath. Patient is requesting "stronger" medication for bacterial vaginosis. I advised her that in order to treat based BV and Trichomonas, po metronidazole is First-Line Agent. I Did Give Her MetroGel If Oral Metronidazole Does Not Resolve Symptoms and I Have Explained to Her to Only Take This Medication If She Needs It. Also suspect the patient has pelvic inflammatory  disease with prior history of STDs. Information given to patient have advised her to follow-up with women's clinic.  Urine hCG is negative patient does not have severe abdominal pain; she also states the suprapubic pressuree does not  feel like her pain when she had an ectopic pregnancy. Referred to women's clinic. Return precautions given.    Burnard Hawthorne, FNP 06/06/17 1306

## 2017-06-06 NOTE — Discharge Instructions (Signed)
If you don't hear from [facility] in 3-4 days, please call yourself to make an appointment to be evaluated for abnormal bleeding.   Today, as we discussed, we will treat empirically for Trichomonas, bacterial vaginosis, chlamydia, gonorrhea. We will wait on the final lab work. Since the oral metronidazole saw did not work well for you in the past, IF it does not work this time, you may use the MetroGel however first try the oral supplement to see if this resolves symptoms as this covers better the trichomonas.  If there is no improvement in your symptoms, or if there is any worsening of symptoms, or if you have any additional concerns, please return for re-evaluation; or, if we are closed, consider going to the Emergency Room for evaluation if symptoms urgent.  Advise that your partner is also treated for sexually transmitted diseases.  No intercourse until symptoms resolve.

## 2017-06-09 LAB — CERVICOVAGINAL ANCILLARY ONLY
CHLAMYDIA, DNA PROBE: NEGATIVE
Neisseria Gonorrhea: NEGATIVE
Wet Prep (BD Affirm): POSITIVE — AB

## 2020-03-06 ENCOUNTER — Other Ambulatory Visit (HOSPITAL_COMMUNITY): Payer: Self-pay | Admitting: Nurse Practitioner

## 2020-04-04 ENCOUNTER — Other Ambulatory Visit: Payer: Self-pay | Admitting: Nurse Practitioner

## 2020-05-03 ENCOUNTER — Other Ambulatory Visit: Payer: Self-pay

## 2020-05-03 ENCOUNTER — Ambulatory Visit (HOSPITAL_COMMUNITY)
Admission: EM | Admit: 2020-05-03 | Discharge: 2020-05-03 | Disposition: A | Discharge status: Home / Self Care | Payer: 59 | Attending: Urgent Care | Admitting: Urgent Care

## 2020-05-03 DIAGNOSIS — L249 Irritant contact dermatitis, unspecified cause: Secondary | ICD-10-CM

## 2020-05-03 DIAGNOSIS — L299 Pruritus, unspecified: Secondary | ICD-10-CM

## 2020-05-03 DIAGNOSIS — T2020XA Burn of second degree of head, face, and neck, unspecified site, initial encounter: Secondary | ICD-10-CM

## 2020-05-03 MED ORDER — HYDROXYZINE HCL 25 MG PO TABS: 12.5000 mg | ORAL_TABLET | Freq: Three times a day (TID) | ORAL | 0 refills | Status: DC | PRN

## 2020-05-03 MED ORDER — TRIAMCINOLONE ACETONIDE 0.1 % EX CREA: 1.0000 "application " | TOPICAL_CREAM | Freq: Two times a day (BID) | CUTANEOUS | 0 refills | Status: DC
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
## 2020-06-29 ENCOUNTER — Ambulatory Visit (HOSPITAL_COMMUNITY)
Admission: EM | Admit: 2020-06-29 | Discharge: 2020-06-29 | Disposition: A | Discharge status: Home / Self Care | Payer: 59 | Attending: Family Medicine | Admitting: Family Medicine

## 2020-06-29 ENCOUNTER — Other Ambulatory Visit: Payer: Self-pay

## 2020-06-29 ENCOUNTER — Encounter (HOSPITAL_COMMUNITY): Payer: Self-pay

## 2020-06-29 DIAGNOSIS — N760 Acute vaginitis: Secondary | ICD-10-CM | POA: Diagnosis not present

## 2020-06-29 MED ORDER — NYSTATIN-TRIAMCINOLONE 100000-0.1 UNIT/GM-% EX CREA: TOPICAL_CREAM | CUTANEOUS | 0 refills | Status: DC

## 2020-06-29 MED ORDER — FLUCONAZOLE 150 MG PO TABS: 150.0000 mg | ORAL_TABLET | Freq: Every day | ORAL | 0 refills | Status: DC

## 2020-06-29 NOTE — Discharge Instructions (Addendum)
Take the medication as prescribed for yeast infection. Follow up as needed for continued or worsening symptoms

## 2020-06-29 NOTE — ED Triage Notes (Deleted)
Pt presents with a vaginal bulge and vaginal discharge X 3 days with vaginal & pelvic  pain.

## 2020-06-29 NOTE — ED Triage Notes (Signed)
Pt present with vaginal irritation. C/o of itching, burning, swelling, redness. Denies discharge xs 2- days.   States she has used OTC medication for yeast infection with no relief. States she has recently changed bath soap and wipes.  States she recently had BV 3-4 weeks with antibiotic and cream, completed course.

## 2020-06-29 NOTE — ED Provider Notes (Signed)
[place]    CSN: [id number] Arrival date & time: 06/29/20  9485      History   Chief Complaint No chief complaint on file.   HPI Sarah Morris is a 39 y.o. female.   Patient is a 39 year old female who presents today with vaginal irritation, itching, burning, swelling. Symptoms been constant over the past 2 days. She has been using over-the-counter medication for yeast infection. Recently changed bath soaps and wipes. Recently treated for BV 3 to 4 weeks ago and completed course. No concern for STDs. Recent testing.     Past Medical History:  Diagnosis Date  . Ectopic pregnancy     There are no problems to display for this patient.   Past Surgical History:  Procedure Laterality Date  . ABDOMINAL HYSTERECTOMY    . DILATION AND CURETTAGE OF UTERUS      OB History    Gravida  3   Para      Term      Preterm      AB  2   Living        SAB      TAB      Ectopic  2   Multiple      Live Births               Home Medications    Prior to Admission medications   Medication Sig Start Date End Date Taking? Authorizing Provider  fluconazole (DIFLUCAN) 150 MG tablet Take 1 tablet (150 mg total) by mouth daily. 06/29/20   Orvan July, NP  nystatin-triamcinolone (MYCOLOG II) cream Apply to affected area daily 06/29/20   Orvan July, NP    Family History Family History  Problem Relation Age of Onset  . Hypertension Mother   . Cancer Father     Social History Social History   Tobacco Use  . Smoking status: Current Every Day Smoker    Types: Cigarettes  . Smokeless tobacco: Never Used  Substance Use Topics  . Alcohol use: Yes    Comment: occ  . Drug use: Yes    Types: Marijuana     Allergies   Food and Neosporin [neomycin-bacitracin zn-polymyx]   Review of Systems Review of Systems   Physical Exam Triage Vital Signs ED Triage Vitals  Enc Vitals Group     BP 06/29/20 0826 (!) 125/92     Pulse Rate 06/29/20 0826  86     Resp 06/29/20 0826 18     Temp 06/29/20 0826 98.5 F (36.9 C)     Temp Source 06/29/20 0826 Oral     SpO2 06/29/20 0826 97 %     Weight --      Height --      Head Circumference --      Peak Flow --      Pain Score 06/29/20 0822 0     Pain Loc --      Pain Edu? --      Excl. in [place]? --    No data found.  Updated Vital Signs BP (!) 125/92 (BP Location: Right Arm)   Pulse 86   Temp 98.5 F (36.9 C) (Oral)   Resp 18   LMP 04/24/2020 (LMP Unknown)   SpO2 97%   Visual Acuity Right Eye Distance:   Left Eye Distance:   Bilateral Distance:    Right Eye Near:   Left Eye Near:    Bilateral Near:  Physical Exam Vitals and nursing note reviewed.  Constitutional:      General: She is not in acute distress.    Appearance: Normal appearance. She is not ill-appearing, toxic-appearing or diaphoretic.  HENT:     Head: Normocephalic.     Nose: Nose normal.  Eyes:     Conjunctiva/sclera: Conjunctivae normal.  Pulmonary:     Effort: Pulmonary effort is normal.  Musculoskeletal:        General: Normal range of motion.     Cervical back: Normal range of motion.  Skin:    General: Skin is warm and dry.     Findings: No rash.  Neurological:     Mental Status: She is alert.  Psychiatric:        Mood and Affect: Mood normal.      UC Treatments / Results  Labs (all labs ordered are listed, but only abnormal results are displayed) Labs Reviewed - No data to display  EKG   Radiology No results found.  Procedures Procedures (including critical care time)  Medications Ordered in UC Medications - No data to display  Initial Impression / Assessment and Plan / UC Course  I have reviewed the triage vital signs and the nursing notes.  Pertinent labs & imaging results that were available during my care of the patient were reviewed by me and considered in my medical decision making (see chart for details).     Vaginitis Treated with Diflucan and Mycolog  cream Follow up as needed for continued or worsening symptoms  Final Clinical Impressions(s) / UC Diagnoses   Final diagnoses:  None     Discharge Instructions     Take the medication as prescribed for yeast infection. Follow up as needed for continued or worsening symptoms     ED Prescriptions    Medication Sig Dispense Auth. Provider   fluconazole (DIFLUCAN) 150 MG tablet Take 1 tablet (150 mg total) by mouth daily. 2 tablet Ivelis Norgard A, NP   nystatin-triamcinolone (MYCOLOG II) cream Apply to affected area daily 15 g Tanisha Lutes A, NP     PDMP not reviewed this encounter.   Loura Halt A, NP 06/29/20 0840

## 2021-01-15 ENCOUNTER — Other Ambulatory Visit (HOSPITAL_COMMUNITY): Payer: Self-pay | Admitting: Obstetrics and Gynecology

## 2021-03-12 ENCOUNTER — Ambulatory Visit (HOSPITAL_COMMUNITY)
Admission: EM | Admit: 2021-03-12 | Discharge: 2021-03-12 | Disposition: A | Discharge status: Home / Self Care | Payer: 59 | Attending: Emergency Medicine | Admitting: Emergency Medicine

## 2021-03-12 ENCOUNTER — Telehealth (HOSPITAL_COMMUNITY): Payer: Self-pay | Admitting: Emergency Medicine

## 2021-03-12 ENCOUNTER — Encounter (HOSPITAL_COMMUNITY): Payer: Self-pay

## 2021-03-12 DIAGNOSIS — L0100 Impetigo, unspecified: Secondary | ICD-10-CM | POA: Diagnosis not present

## 2021-03-12 DIAGNOSIS — R21 Rash and other nonspecific skin eruption: Secondary | ICD-10-CM

## 2021-03-12 MED ORDER — ALTABAX 1 % EX OINT: TOPICAL_OINTMENT | Freq: Two times a day (BID) | CUTANEOUS | 0 refills | Status: AC

## 2021-03-12 MED ORDER — HYDROXYZINE HCL 25 MG PO TABS: 25.0000 mg | ORAL_TABLET | Freq: Four times a day (QID) | ORAL | 0 refills | Status: DC

## 2021-03-12 MED ORDER — MUPIROCIN CALCIUM 2 % EX CREA: 1.0000 "application " | TOPICAL_CREAM | Freq: Two times a day (BID) | CUTANEOUS | 0 refills | Status: AC

## 2021-03-12 NOTE — Discharge Instructions (Addendum)
Apply the Bactroban twice a day for the next 5 days.    You can take the hydroxyzine as needed for itching and irritation.  It can make you sleepy so don't take it before driving.  You can take Tylenol and/or Ibuprofen as needed for pain relief and fever reduction.   Return or go to the Emergency Department if symptoms worsen or do not improve in the next few days.

## 2021-03-12 NOTE — Telephone Encounter (Signed)
Patient called requesting a new prescription as her insurance will not cover the initial prescription for Bactroban.  Altabax prescription sent.

## 2021-03-12 NOTE — ED Triage Notes (Signed)
Pt reports itchiness, burning sensation and pain in the eyebrows x 5 days after having eyebrows done.

## 2021-03-12 NOTE — ED Provider Notes (Signed)
[place]    CSN: [id number] Arrival date & time: 03/12/21  9211      History   Chief Complaint Chief Complaint  Patient presents with  . eyebrow problem    HPI Sarah Morris is a 40 y.o. female.   Patient here for evaluation as bilateral eyebrow rash and pain.  Reports recently having eyebrows done and developed rash shortly after.  Reports having eyebrow done previously with no issues.  Reports itching and burning to bilateral eyebrows.  Has tried hydrocortisone cream and Vaseline with minimal relief. Denies any specific alleviating or aggravating factors.  Denies any fevers, chest pain, shortness of breath, N/V/D, numbness, tingling, weakness, abdominal pain, or headaches.   ROS: As per HPI, all other pertinent ROS negative   The history is provided by the patient.    Past Medical History:  Diagnosis Date  . Ectopic pregnancy     There are no problems to display for this patient.   Past Surgical History:  Procedure Laterality Date  . ABDOMINAL HYSTERECTOMY    . DILATION AND CURETTAGE OF UTERUS      OB History    Gravida  3   Para      Term      Preterm      AB  2   Living        SAB      IAB      Ectopic  2   Multiple      Live Births               Home Medications    Prior to Admission medications   Medication Sig Start Date End Date Taking? Authorizing Provider  hydrOXYzine (ATARAX/VISTARIL) 25 MG tablet Take 1 tablet (25 mg total) by mouth every 6 (six) hours. 03/12/21  Yes Pearson Forster, NP  mupirocin cream (BACTROBAN) 2 % Apply 1 application topically 2 (two) times daily for 5 days. 03/12/21 03/17/21 Yes Pearson Forster, NP  fluconazole (DIFLUCAN) 150 MG tablet Take 1 tablet (150 mg total) by mouth daily. 06/29/20   Loura Halt A, NP  medroxyPROGESTERone (DEPO-PROVERA) 150 MG/ML injection INJECT 1 ML INTRAMUSCULAR EVERY 3 MONTHS 90 DAYS 01/15/21 01/15/22  Drema Dallas, DO  medroxyPROGESTERone (DEPO-PROVERA) 150 MG/ML  injection INJECT 1 ML INTRAMUSCULAR EVERY 90 DAYS 03/06/20 03/06/21  Thongteum, Maryjo [place], NP  nystatin-triamcinolone (MYCOLOG II) cream Apply to affected area daily 06/29/20   Orvan July, NP    Family History Family History  Problem Relation Age of Onset  . Hypertension Mother   . Cancer Father     Social History Social History   Tobacco Use  . Smoking status: Current Every Day Smoker    Types: Cigarettes  . Smokeless tobacco: Never Used  Substance Use Topics  . Alcohol use: Yes    Comment: occ  . Drug use: Yes    Types: Marijuana     Allergies   Food and Neosporin [neomycin-bacitracin zn-polymyx]   Review of Systems Review of Systems  Skin: Positive for rash. Negative for wound.  All other systems reviewed and are negative.    Physical Exam Triage Vital Signs ED Triage Vitals  Enc Vitals Group     BP 03/12/21 0901 131/90     Pulse Rate 03/12/21 0901 82     Resp 03/12/21 0901 18     Temp 03/12/21 0901 99.1 F (37.3 C)     Temp Source 03/12/21 0901 Oral  SpO2 03/12/21 0901 99 %     Weight --      Height --      Head Circumference --      Peak Flow --      Pain Score 03/12/21 0858 10     Pain Loc --      Pain Edu? --      Excl. in [place]? --    No data found.  Updated Vital Signs BP 131/90 (BP Location: Left Arm)   Pulse 82   Temp 99.1 F (37.3 C) (Oral)   Resp 18   SpO2 99%   Visual Acuity Right Eye Distance:   Left Eye Distance:   Bilateral Distance:    Right Eye Near:   Left Eye Near:    Bilateral Near:     Physical Exam Vitals and nursing note reviewed.  Constitutional:      General: She is not in acute distress.    Appearance: Normal appearance. She is not ill-appearing, toxic-appearing or diaphoretic.  HENT:     Head: Normocephalic and atraumatic.  Eyes:     Conjunctiva/sclera: Conjunctivae normal.  Cardiovascular:     Rate and Rhythm: Normal rate.     Pulses: Normal pulses.  Pulmonary:     Effort: Pulmonary effort is normal.   Abdominal:     General: Abdomen is flat.  Musculoskeletal:        General: Normal range of motion.     Cervical back: Normal range of motion.  Skin:    General: Skin is warm and dry.     Findings: Rash present. Rash is crusting (honey colored crusting lesions to bilateral eyebrows ).  Neurological:     General: No focal deficit present.     Mental Status: She is alert and oriented to person, place, and time.  Psychiatric:        Mood and Affect: Mood normal.      UC Treatments / Results  Labs (all labs ordered are listed, but only abnormal results are displayed) Labs Reviewed - No data to display  EKG   Radiology No results found.  Procedures Procedures (including critical care time)  Medications Ordered in UC Medications - No data to display  Initial Impression / Assessment and Plan / UC Course  I have reviewed the triage vital signs and the nursing notes.  Pertinent labs & imaging results that were available during my care of the patient were reviewed by me and considered in my medical decision making (see chart for details).     Assessment negative for red flags or concerns.  Most likely impetigo.  Will treat with Bactroban twice daily for next 5 days.  Also prescribed Atarax as needed for itching.  Recommend Tylenol and/or ibuprofen as needed for pain relief.  Follow-up with primary care or return if symptoms are not improving or worsening over the next few days.   Final Clinical Impressions(s) / UC Diagnoses   Final diagnoses:  Rash and nonspecific skin eruption  Impetigo     Discharge Instructions     Apply the Bactroban twice a day for the next 5 days.    You can take the hydroxyzine as needed for itching and irritation.  It can make you sleepy so don't take it before driving.  You can take Tylenol and/or Ibuprofen as needed for pain relief and fever reduction.   Return or go to the Emergency Department if symptoms worsen or do not improve in the  next few  days.      ED Prescriptions    Medication Sig Dispense Auth. Provider   mupirocin cream (BACTROBAN) 2 % Apply 1 application topically 2 (two) times daily for 5 days. 15 g Pearson Forster, NP   hydrOXYzine (ATARAX/VISTARIL) 25 MG tablet Take 1 tablet (25 mg total) by mouth every 6 (six) hours. 12 tablet Pearson Forster, NP     PDMP not reviewed this encounter.   Pearson Forster, NP 03/12/21 [phone]

## 2021-03-14 ENCOUNTER — Other Ambulatory Visit: Payer: Self-pay

## 2021-03-14 ENCOUNTER — Encounter (HOSPITAL_COMMUNITY): Payer: Self-pay

## 2021-03-14 ENCOUNTER — Ambulatory Visit (HOSPITAL_COMMUNITY)
Admission: EM | Admit: 2021-03-14 | Discharge: 2021-03-14 | Disposition: A | Discharge status: Home / Self Care | Payer: 59 | Attending: Family Medicine | Admitting: Family Medicine

## 2021-03-14 DIAGNOSIS — L089 Local infection of the skin and subcutaneous tissue, unspecified: Secondary | ICD-10-CM | POA: Diagnosis not present

## 2021-03-14 DIAGNOSIS — B9689 Other specified bacterial agents as the cause of diseases classified elsewhere: Secondary | ICD-10-CM | POA: Diagnosis not present

## 2021-03-14 DIAGNOSIS — T7840XA Allergy, unspecified, initial encounter: Secondary | ICD-10-CM

## 2021-03-14 MED ORDER — DEXAMETHASONE SODIUM PHOSPHATE 10 MG/ML IJ SOLN
10.0000 mg | Freq: Once | INTRAMUSCULAR | Status: AC
  Administered 2021-03-14: 10 mg via INTRAMUSCULAR

## 2021-03-14 MED ORDER — AMOXICILLIN-POT CLAVULANATE 875-125 MG PO TABS: 1.0000 | ORAL_TABLET | Freq: Two times a day (BID) | ORAL | 0 refills | Status: DC

## 2021-03-14 MED ORDER — DEXAMETHASONE SODIUM PHOSPHATE 10 MG/ML IJ SOLN
INTRAMUSCULAR | Status: AC
  Filled 2021-03-14: qty 1

## 2021-03-14 NOTE — ED Provider Notes (Signed)
  [place]   [id number] 03/14/21 Arrival Time: 0831  ASSESSMENT & PLAN:  1. Allergic reaction, initial encounter   2. Superficial bacterial skin infection    Begin: Meds ordered this encounter  Medications  . dexamethasone (DECADRON) injection 10 mg  . amoxicillin-clavulanate (AUGMENTIN) 875-125 MG tablet    Sig: Take 1 tablet by mouth every 12 (twelve) hours.    Dispense:  20 tablet    Refill:  0   Warm compresses as needed. Will follow up with PCP or here if worsening or failing to improve as anticipated. Reviewed expectations re: course of current medical issues. Questions answered. Outlined signs and symptoms indicating need for more acute intervention. Patient verbalized understanding. After Visit Summary given.   SUBJECTIVE:  Sarah Morris is a 40 y.o. female who presents with a skin complaint. Seen here 48 hours ago. Mupirocin cream without relief. Reports continued itching and pain. Afebrile. All of this started after eyebrow treatment.   OBJECTIVE: Vitals:   03/14/21 0918  BP: (!) 132/95  Pulse: 91  Resp: 18  Temp: 98.3 F (36.8 C)  TempSrc: Oral  SpO2: 98%    General appearance: alert; no distress HEENT: [place]; AT Neck: supple with FROM Extremities: no edema; moves all extremities normally Skin: warm and dry; significant erythematous papular rash around bilateral eyebrows with overlying honey-colored crusting; TTP; no open wounds Psychological: alert and cooperative; normal mood and affect  Allergies  Allergen Reactions  . Food Swelling    Black pepper  . Neosporin [Neomycin-Bacitracin Zn-Polymyx] Rash    Past Medical History:  Diagnosis Date  . Ectopic pregnancy    Social History   Socioeconomic History  . Marital status: Single    Spouse name: Not on file  . Number of children: Not on file  . Years of education: Not on file  . Highest education level: Not on file  Occupational History  . Not on file  Tobacco Use  . Smoking  status: Current Every Day Smoker    Types: Cigarettes  . Smokeless tobacco: Never Used  Substance and Sexual Activity  . Alcohol use: Yes    Comment: occ  . Drug use: Yes    Types: Marijuana  . Sexual activity: Not on file  Other Topics Concern  . Not on file  Social History Narrative  . Not on file   Social Determinants of Health   Financial Resource Strain: Not on file  Food Insecurity: Not on file  Transportation Needs: Not on file  Physical Activity: Not on file  Stress: Not on file  Social Connections: Not on file  Intimate Partner Violence: Not on file   Family History  Problem Relation Age of Onset  . Hypertension Mother   . Cancer Father    Past Surgical History:  Procedure Laterality Date  . ABDOMINAL HYSTERECTOMY    . DILATION AND CURETTAGE OF UTERUS       Vanessa Kick, MD 03/14/21 1031

## 2021-03-14 NOTE — Discharge Instructions (Addendum)
Meds ordered this encounter  Medications   dexamethasone (DECADRON) injection 10 mg   amoxicillin-clavulanate (AUGMENTIN) 875-125 MG tablet    Sig: Take 1 tablet by mouth every 12 (twelve) hours.    Dispense:  20 tablet    Refill:  0

## 2021-03-14 NOTE — ED Notes (Signed)
Pt requests to be seen by MD, Dr. Mannie Stabile to be notified.

## 2021-03-14 NOTE — ED Triage Notes (Signed)
Pt reports the rash spreader after using muciporin 2 days ago. Pt reports she woke up this morning with swelling in the right eye.

## 2021-04-02 ENCOUNTER — Other Ambulatory Visit (HOSPITAL_COMMUNITY): Payer: Self-pay | Admitting: Obstetrics and Gynecology

## 2021-04-02 ENCOUNTER — Other Ambulatory Visit (HOSPITAL_COMMUNITY): Payer: Self-pay

## 2021-04-02 MED ORDER — MEDROXYPROGESTERONE ACETATE 150 MG/ML IM SUSP
INTRAMUSCULAR | 1 refills | Status: DC
  Filled 2021-04-02 – 2021-04-12 (×2): qty 1, 84d supply, fill #0

## 2021-04-12 ENCOUNTER — Other Ambulatory Visit (HOSPITAL_COMMUNITY): Payer: Self-pay

## 2021-05-31 ENCOUNTER — Encounter (HOSPITAL_COMMUNITY): Payer: Self-pay | Admitting: Emergency Medicine

## 2021-05-31 ENCOUNTER — Ambulatory Visit (HOSPITAL_COMMUNITY)
Admission: EM | Admit: 2021-05-31 | Discharge: 2021-05-31 | Disposition: A | Discharge status: Home / Self Care | Payer: 59 | Attending: Emergency Medicine | Admitting: Emergency Medicine

## 2021-05-31 ENCOUNTER — Other Ambulatory Visit: Payer: Self-pay

## 2021-05-31 DIAGNOSIS — M62838 Other muscle spasm: Secondary | ICD-10-CM | POA: Diagnosis not present

## 2021-05-31 DIAGNOSIS — S39012A Strain of muscle, fascia and tendon of lower back, initial encounter: Secondary | ICD-10-CM

## 2021-05-31 MED ORDER — IBUPROFEN 600 MG PO TABS: 600.0000 mg | ORAL_TABLET | Freq: Four times a day (QID) | ORAL | 0 refills | Status: DC | PRN

## 2021-05-31 MED ORDER — TIZANIDINE HCL 4 MG PO TABS: 4.0000 mg | ORAL_TABLET | Freq: Three times a day (TID) | ORAL | 0 refills | Status: DC | PRN
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
## 2021-05-31 NOTE — Discharge Instructions (Addendum)
People tend to feel worse over the next several days, but most people are back to normal in 1 week. A small number of people will have persistent pain for up to six weeks. Take the 600 mg of ibuprofen combined with 1000 mg of tylenol 4 times a day. This is an extremely effective combination for pain.  Do not exceed 4 grams of tylenol per day from all sources.  Zanaflex will help relax your muscles.   Go to www.goodrx.com  or www.costplusdrugs.com to look up your medications. This will give you a list of where you can find your prescriptions at the most affordable prices. Or ask the pharmacist what the cash price is, or if they have any other discount programs available to help make your medication more affordable. This can be less expensive than what you would pay with insurance.
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
## 2021-08-10 ENCOUNTER — Encounter (HOSPITAL_COMMUNITY): Payer: Self-pay | Admitting: Emergency Medicine

## 2021-08-10 ENCOUNTER — Other Ambulatory Visit: Payer: Self-pay

## 2021-08-10 ENCOUNTER — Ambulatory Visit (HOSPITAL_COMMUNITY)
Admission: EM | Admit: 2021-08-10 | Discharge: 2021-08-10 | Disposition: A | Discharge status: Home / Self Care | Payer: 59 | Attending: Internal Medicine | Admitting: Internal Medicine

## 2021-08-10 DIAGNOSIS — H9202 Otalgia, left ear: Secondary | ICD-10-CM | POA: Diagnosis not present

## 2021-08-10 MED ORDER — KETOROLAC TROMETHAMINE 30 MG/ML IJ SOLN
30.0000 mg | Freq: Once | INTRAMUSCULAR | Status: AC
  Administered 2021-08-10: 30 mg via INTRAMUSCULAR

## 2021-08-10 MED ORDER — KETOROLAC TROMETHAMINE 30 MG/ML IJ SOLN
INTRAMUSCULAR | Status: AC
  Filled 2021-08-10: qty 1

## 2021-08-10 MED ORDER — AMOXICILLIN-POT CLAVULANATE 875-125 MG PO TABS: 1.0000 | ORAL_TABLET | Freq: Two times a day (BID) | ORAL | 0 refills | Status: AC

## 2021-08-10 NOTE — Discharge Instructions (Signed)
Take antibiotic as prescribed.  You have been given a shot of Toradol in the office today.  As we discussed, no Motrin, Aleve or ibuprofen for 24 hours.  After that you can use ibuprofen every 8 hours as needed for pain.  Please return for any worsening or new symptoms.

## 2021-08-10 NOTE — ED Provider Notes (Signed)
[place]    CSN: [id number] Arrival date & time: 08/10/21  1212      History   Chief Complaint Chief Complaint  Patient presents with   Otalgia    HPI Sarah Morris is a 40 y.o. female presents urgent care today with complaints of left ear pain x 5 days.  Patient reports pain mostly located behind left ear and down neck with tenderness to front cartilage of ear.  She is quite tearful on exam.  She denies any recent fever, chills, suspicious rashes or lesions, recent nasal congestion or headache.    Past Medical History:  Diagnosis Date   Ectopic pregnancy     There are no problems to display for this patient.   Past Surgical History:  Procedure Laterality Date   ABDOMINAL HYSTERECTOMY     DILATION AND CURETTAGE OF UTERUS      OB History     Gravida  3   Para      Term      Preterm      AB  2   Living         SAB      IAB      Ectopic  2   Multiple      Live Births               Home Medications    Prior to Admission medications   Medication Sig Start Date End Date Taking? Authorizing Provider  amoxicillin-clavulanate (AUGMENTIN) 875-125 MG tablet Take 1 tablet by mouth 2 (two) times daily for 7 days. 08/10/21 08/17/21 Yes Rudolpho Sevin, NP  hydrOXYzine (ATARAX/VISTARIL) 25 MG tablet Take 1 tablet (25 mg total) by mouth every 6 (six) hours. 03/12/21   Pearson Forster, NP  ibuprofen (ADVIL) 600 MG tablet Take 1 tablet (600 mg total) by mouth every 6 (six) hours as needed. 05/31/21   Melynda Ripple, MD  medroxyPROGESTERone (DEPO-PROVERA) 150 MG/ML injection INJECT 1 ML INTRAMUSCULAR EVERY 3 MONTHS 90 DAYS 01/15/21 01/15/22  Drema Dallas, DO  medroxyPROGESTERone (DEPO-PROVERA) 150 MG/ML injection INJECT 1 ML INTRAMUSCULAR EVERY 90 DAYS 03/06/20 03/06/21  Thongteum, Maryjo Rochester, NP  medroxyPROGESTERone (DEPO-PROVERA) 150 MG/ML injection Inject 1 ml Intramuscular Every 3 months 90 days 04/02/21     mupirocin ointment (BACTROBAN) 2 %  Apply topically 2 (two) times daily. 03/12/21   [provider]  norethindrone (MICRONOR) 0.35 MG tablet Take 1 tablet by mouth daily. 10/23/20   [provider]  tiZANidine (ZANAFLEX) 4 MG tablet Take 1 tablet (4 mg total) by mouth every 8 (eight) hours as needed for muscle spasms. 05/31/21   Melynda Ripple, MD    Family History Family History  Problem Relation Age of Onset   Hypertension Mother    Cancer Father     Social History Social History   Tobacco Use   Smoking status: Every Day    Types: Cigarettes   Smokeless tobacco: Never  Substance Use Topics   Alcohol use: Yes    Comment: occ   Drug use: Yes    Types: Marijuana     Allergies   Food and Neosporin [neomycin-bacitracin zn-polymyx]   Review of Systems As stated in HPI otherwise negative   Physical Exam Triage Vital Signs ED Triage Vitals  Enc Vitals Group     BP 08/10/21 1356 133/86     Pulse Rate 08/10/21 1356 95     Resp 08/10/21 1356 16     Temp 08/10/21  1356 98.1 F (36.7 C)     Temp Source 08/10/21 1356 Oral     SpO2 08/10/21 1356 98 %     Weight --      Height --      Head Circumference --      Peak Flow --      Pain Score 08/10/21 1358 10     Pain Loc --      Pain Edu? --      Excl. in [place]? --    No data found.  Updated Vital Signs BP 133/86 (BP Location: Right Arm)   Pulse 95   Temp 98.1 F (36.7 C) (Oral)   Resp 16   SpO2 98%   Visual Acuity Right Eye Distance:   Left Eye Distance:   Bilateral Distance:    Right Eye Near:   Left Eye Near:    Bilateral Near:     Physical Exam Constitutional:      General: She is not in acute distress.    Appearance: Normal appearance. She is not ill-appearing or toxic-appearing.  HENT:     Head: Normocephalic and atraumatic.     Right Ear: Tympanic membrane, ear canal and external ear normal.     Left Ear: Tympanic membrane, ear canal and external ear normal.     Ears:     Comments: Tender to palpation on left  pinna, also tender with mild lymphadenopathy on left post auricular cervical nodes    Nose: No congestion.     Mouth/Throat:     Mouth: Mucous membranes are moist.     Pharynx: No oropharyngeal exudate or posterior oropharyngeal erythema.  Skin:    General: Skin is warm and dry.     Findings: No lesion or rash.  Neurological:     General: No focal deficit present.     Mental Status: She is alert and oriented to person, place, and time.  Psychiatric:        Mood and Affect: Mood normal.        Behavior: Behavior normal.     UC Treatments / Results  Labs (all labs ordered are listed, but only abnormal results are displayed) Labs Reviewed - No data to display  EKG   Radiology No results found.  Procedures Procedures (including critical care time)  Medications Ordered in UC Medications  ketorolac (TORADOL) 30 MG/ML injection 30 mg (30 mg Intramuscular Given 08/10/21 1431)    Initial Impression / Assessment and Plan / UC Course  I have reviewed the triage vital signs and the nursing notes.  Pertinent labs & imaging results that were available during my care of the patient were reviewed by me and considered in my medical decision making (see chart for details).  Otalgia, left air -Unclear etiology as actual ear canal and TM appear normal.  Do not see any suspicious lesions that would suggest shingles pain.  She does have some mild lymphadenopathy in postauricular lymph nodes -We will treat empirically with Augmentin twice daily -Toradol IM in office and ibuprofen as needed starting tomorrow afternoon. -Return for any worsening or persistent symptoms  Reviewed expections re: course of current medical issues. Questions answered. Outlined signs and symptoms indicating need for more acute intervention. Pt verbalized understanding. AVS given  Final Clinical Impressions(s) / UC Diagnoses   Final diagnoses:  Otalgia of left ear     Discharge Instructions      Take  antibiotic as prescribed.  You have been given a shot of  Toradol in the office today.  As we discussed, no Motrin, Aleve or ibuprofen for 24 hours.  After that you can use ibuprofen every 8 hours as needed for pain.  Please return for any worsening or new symptoms.     ED Prescriptions     Medication Sig Dispense Auth. Provider   amoxicillin-clavulanate (AUGMENTIN) 875-125 MG tablet Take 1 tablet by mouth 2 (two) times daily for 7 days. 28 tablet Rudolpho Sevin, NP      PDMP not reviewed this encounter.   Rudolpho Sevin, NP 08/10/21 [phone]

## 2021-08-10 NOTE — ED Triage Notes (Signed)
Pt c/o left ear pain for 4-5 days.

## 2022-02-16 ENCOUNTER — Encounter (HOSPITAL_COMMUNITY): Payer: Self-pay

## 2022-02-16 ENCOUNTER — Ambulatory Visit (HOSPITAL_COMMUNITY)
Admission: RE | Admit: 2022-02-16 | Discharge: 2022-02-16 | Disposition: A | Discharge status: Home / Self Care | Payer: 59 | Source: Ambulatory Visit | Attending: Emergency Medicine | Admitting: Emergency Medicine

## 2022-02-16 VITALS — BP 145/88 | HR 89 | Temp 97.9°F | Resp 18

## 2022-02-16 DIAGNOSIS — R8281 Pyuria: Secondary | ICD-10-CM | POA: Diagnosis present

## 2022-02-16 DIAGNOSIS — F17210 Nicotine dependence, cigarettes, uncomplicated: Secondary | ICD-10-CM | POA: Diagnosis present

## 2022-02-16 DIAGNOSIS — M545 Low back pain, unspecified: Secondary | ICD-10-CM | POA: Diagnosis present

## 2022-02-16 DIAGNOSIS — R030 Elevated blood-pressure reading, without diagnosis of hypertension: Secondary | ICD-10-CM

## 2022-02-16 DIAGNOSIS — R3121 Asymptomatic microscopic hematuria: Secondary | ICD-10-CM

## 2022-02-16 DIAGNOSIS — N898 Other specified noninflammatory disorders of vagina: Secondary | ICD-10-CM | POA: Diagnosis present

## 2022-02-16 DIAGNOSIS — N760 Acute vaginitis: Secondary | ICD-10-CM | POA: Diagnosis present

## 2022-02-16 DIAGNOSIS — B9689 Other specified bacterial agents as the cause of diseases classified elsewhere: Secondary | ICD-10-CM | POA: Diagnosis present

## 2022-02-16 DIAGNOSIS — Z3042 Encounter for surveillance of injectable contraceptive: Secondary | ICD-10-CM

## 2022-02-16 LAB — POCT URINALYSIS DIPSTICK, ED / UC
Bilirubin Urine: NEGATIVE
Glucose, UA: NEGATIVE mg/dL
Ketones, ur: NEGATIVE mg/dL
Nitrite: NEGATIVE
Protein, ur: NEGATIVE mg/dL
Specific Gravity, Urine: 1.02 (ref 1.005–1.030)
Urobilinogen, UA: 0.2 mg/dL (ref 0.0–1.0)
pH: 7.5 (ref 5.0–8.0)

## 2022-02-16 LAB — POC URINE PREG, ED: Preg Test, Ur: NEGATIVE

## 2022-02-16 MED ORDER — SOLOSEC 2 G PO PACK: 2.0000 g | PACK | Freq: Once | ORAL | 1 refills | Status: AC

## 2022-02-16 MED ORDER — NAPROXEN 500 MG PO TABS: 500.0000 mg | ORAL_TABLET | Freq: Two times a day (BID) | ORAL | 0 refills | Status: DC

## 2022-02-16 MED ORDER — BACLOFEN 10 MG PO TABS: 10.0000 mg | ORAL_TABLET | Freq: Every day | ORAL | 0 refills | Status: AC

## 2022-02-16 MED ORDER — CLINDAMYCIN PHOSPHATE 2 % VA CREA: 1.0000 | TOPICAL_CREAM | Freq: Every day | VAGINAL | 0 refills | Status: DC

## 2022-02-16 NOTE — Discharge Instructions (Addendum)
The results of your STD testing today will be made available to you once they are complete, this typically takes 3 to 5 days.  They will initially be posted to your MyChart and, if any of your results are abnormal, you will receive a phone call with those results along with further instructions regarding any further treatment, if needed.  ? ?Based on the history that you provided to me today, you will be treated empirically for bacterial vaginitis with Solosec granules.  This is a single dose treatment, please take as recommended.  I also provided you with a prescription for the cream that you mentioned, clindamycin.  Please insert 1 applicatorful at bedtime every night for the next 7 nights.  Please avoid sexual intercourse or placing anything into the vagina during the full 7-day treatment.  If you have any of the clindamycin gel leftover, please reserve it and ask your partner to apply the gel to the tip of his penis after sexual intercourse for the next 1 to 2 weeks after you complete your 7-day treatment. ? ?Your urine pregnancy test today is negative. ? ?The urinalysis that we performed in the clinic today was abnormal.  Urine culture will be performed per our protocol.  The result of the urine culture will be available in the next 3 to 5 days and will be posted to your MyChart account.  If there is an abnormal finding, you will be contacted by phone and advised of further treatment recommendations, if any. ?  ?If you have not had complete resolution of your symptoms after completing treatment as prescribed, please return to urgent care for repeat evaluation or follow-up with your primary care provider. ? ?Your blood pressure was elevated during your visit today.  This may be due to stress but it could also be due to cigarette smoking.  As we discussed, smoking cigarettes also increases your risk of blood clots, particularly when you are taking hormonal birth control.  Please consider working on cutting back  or quitting smoking. ?  ?Thank you for visiting urgent care today.  I appreciate the opportunity to participate in your care. ? ?

## 2022-02-16 NOTE — ED Provider Notes (Signed)
?[facility] ? ? ? ?CSN: [id number] ?Arrival date & time: 02/16/22  1421 ?  ? ?HISTORY  ? ?Chief Complaint  ?Patient presents with  ? Back Pain  ? SEXUALLY TRANSMITTED DISEASE  ? ?HPI ?Sarah Morris is a 41 y.o. female. Pt c/o abnormal vaginal discharge with scant spotting and lower back pain.  Patient states she uses Depo-Provera for birth control and the spotting is not unusual.  Patient states she has been with 1 partner for the past 3 months and is not aware of any STD exposure.  Patient states she does not have any abdominal pain, burning with urination, genital lesions.  Patient's blood pressure is mildly elevated today.  Patient reports being under a lot of stress.  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  Patient reports recent HIV screening which was normal.  Patient states she gets BV once every 3 months or so, states she does not do well with the vaginal gel treatment but did respond well to a vaginal cream prescribed which she does not know the name of and very much does not tolerate metronidazole tablets. ? ?The history is provided by the patient.  ?Past Medical History:  ?Diagnosis Date  ? Ectopic pregnancy   ? ?There are no problems to display for this patient. ? ?Past Surgical History:  ?Procedure Laterality Date  ? ABDOMINAL HYSTERECTOMY    ? DILATION AND CURETTAGE OF UTERUS    ? ?OB History   ? ? Gravida  ?3  ? Para  ?   ? Term  ?   ? Preterm  ?   ? AB  ?2  ? Living  ?   ?  ? ? SAB  ?   ? IAB  ?   ? Ectopic  ?2  ? Multiple  ?   ? Live Births  ?   ?   ?  ?  ? ?Home Medications   ? ?Prior to Admission medications   ?Medication Sig Start Date End Date Taking? Authorizing Provider  ?medroxyPROGESTERone (DEPO-PROVERA) 150 MG/ML injection INJECT 1 ML INTRAMUSCULAR EVERY 3 MONTHS 90 DAYS 01/15/21 01/15/22  Drema Dallas, DO  ? ?Family History ?Family History  ?Problem Relation Age of Onset  ? Hypertension Mother    ? Cancer Father   ? ?Social History ?Social History  ? ?Tobacco Use  ? Smoking status: Every Day  ?  Types: Cigarettes  ? Smokeless tobacco: Never  ?Substance Use Topics  ? Alcohol use: Yes  ?  Comment: occ  ? Drug use: Yes  ?  Types: Marijuana  ? ?Allergies   ?Black pepper-turmeric, Food, Bacitracin-polymyxin b, and Neosporin [neomycin-bacitracin zn-polymyx] ? ?Review of Systems ?Review of Systems ?Pertinent findings noted in history of present illness.  ? ?Physical Exam ?Triage Vital Signs ?ED Triage Vitals  ?Enc Vitals Group  ?   BP 09/14/21 0827 (!) 147/82  ?   Pulse Rate 09/14/21 0827 72  ?   Resp 09/14/21 0827 18  ?   Temp 09/14/21 0827 98.3 ?F (36.8 ?C)  ?   Temp Source 09/14/21 0827 Oral  ?   SpO2 09/14/21 0827 98 %  ?   Weight --   ?   Height --   ?   Head Circumference --   ?   Peak Flow --   ?   Pain Score 09/14/21 0826 5  ?   Pain Loc --   ?  Pain Edu? --   ?   Excl. in [place]? --   ?No data found. ? ?Updated Vital Signs ?BP (!) 145/88 (BP Location: Right Arm)   Pulse 89   Temp 97.9 ?F (36.6 ?C) (Oral)   Resp 18   SpO2 99%  ? ?Physical Exam ?Vitals and nursing note reviewed.  ?Constitutional:   ?   General: She is not in acute distress. ?   Appearance: Normal appearance. She is not ill-appearing.  ?HENT:  ?   Head: Normocephalic and atraumatic.  ?Eyes:  ?   General: Lids are normal.     ?   Right eye: No discharge.     ?   Left eye: No discharge.  ?   Extraocular Movements: Extraocular movements intact.  ?   Conjunctiva/sclera: Conjunctivae normal.  ?   Right eye: Right conjunctiva is not injected.  ?   Left eye: Left conjunctiva is not injected.  ?Neck:  ?   Trachea: Trachea and phonation normal.  ?Cardiovascular:  ?   Rate and Rhythm: Normal rate and regular rhythm.  ?   Pulses: Normal pulses.  ?   Heart sounds: Normal heart sounds. No murmur heard. ?  No friction rub. No gallop.  ?Pulmonary:  ?   Effort: Pulmonary effort is normal. No accessory muscle usage, prolonged expiration or respiratory  distress.  ?   Breath sounds: Normal breath sounds. No stridor, decreased air movement or transmitted upper airway sounds. No decreased breath sounds, wheezing, rhonchi or rales.  ?Chest:  ?   Chest wall: No tenderness.  ?Genitourinary: ?   Comments: Patient politely declines pelvic exam today, patient provided a vaginal swab for testing. ?Musculoskeletal:     ?   General: Normal range of motion.  ?   Cervical back: Normal range of motion and neck supple. Normal range of motion.  ?Lymphadenopathy:  ?   Cervical: No cervical adenopathy.  ?Skin: ?   General: Skin is warm and dry.  ?   Findings: No erythema or rash.  ?Neurological:  ?   General: No focal deficit present.  ?   Mental Status: She is alert and oriented to person, place, and time.  ?Psychiatric:     ?   Mood and Affect: Mood normal.     ?   Behavior: Behavior normal.  ? ? ?Visual Acuity ?Right Eye Distance:   ?Left Eye Distance:   ?Bilateral Distance:   ? ?Right Eye Near:   ?Left Eye Near:    ?Bilateral Near:    ? ?UC Couse / Diagnostics / Procedures:  ?  ?EKG ? ?Radiology ?No results found. ? ?Procedures ?Procedures (including critical care time) ? ?UC Diagnoses / Final Clinical Impressions(s)   ?I have reviewed the triage vital signs and the nursing notes. ? ?Pertinent labs & imaging results that were available during my care of the patient were reviewed by me and considered in my medical decision making (see chart for details).   ? ?Final diagnoses:  ?Acute bilateral low back pain without sciatica  ?Pyuria  ?Asymptomatic microscopic hematuria  ?Vaginal discharge  ?Bacterial vaginosis  ?Depo-Provera contraceptive status  ?Tobacco dependence due to cigarettes  ?Elevated blood pressure reading in office without diagnosis of hypertension  ? ?Patient provided with naproxen and baclofen for lower back pain. ? ?Given patient's intolerance of metronidazole and poor response to metronidazole gel, will provide patient with clindamycin vaginal cream and Solosec.   Patient states she would like to await the results of  her vaginal swab prior to beginning these but because these 2 agents are not our protocol, we will send them now.  Patient advised to pick them up only if she needs them. ?ED Prescriptions   ? ? Medication Sig Dispense Auth. Provider  ? naproxen (NAPROSYN) 500 MG tablet Take 1 tablet (500 mg total) by mouth 2 (two) times daily. 30 tablet Lynden Oxford Scales, PA-C  ? baclofen (LIORESAL) 10 MG tablet Take 1 tablet (10 mg total) by mouth at bedtime for 7 days. 7 tablet Lynden Oxford Scales, PA-C  ? Secnidazole (SOLOSEC) 2 g PACK Take 2 g by mouth once for 1 dose. 1 each Lynden Oxford Scales, PA-C  ? clindamycin (CLEOCIN) 2 % vaginal cream Place 1 Applicatorful vaginally at bedtime. 40 g Lynden Oxford Scales, PA-C  ? ?  ? ?PDMP not reviewed this encounter. ? ?Pending results:  ?Labs Reviewed  ?POCT URINALYSIS DIPSTICK, ED / UC - Abnormal; Notable for the following components:  ?    Result Value  ? Hgb urine dipstick SMALL (*)   ? Leukocytes,Ua TRACE (*)   ? All other components within normal limits  ?POCT URINALYSIS DIP (MANUAL ENTRY)  ?POCT URINE PREGNANCY  ?POC URINE PREG, ED  ?CERVICOVAGINAL ANCILLARY ONLY  ? ? ?Medications Ordered in UC: ?Medications - No data to display ? ?Disposition Upon Discharge:  ?Condition: stable for discharge home ? ?Patient presented with concern for an acute illness with associated systemic symptoms and significant discomfort requiring urgent management. In my opinion, this is a condition that a prudent lay person (someone who possesses an average knowledge of health and medicine) may potentially expect to result in complications if not addressed urgently such as respiratory distress, impairment of bodily function or dysfunction of bodily organs.  ? ?As such, the patient has been evaluated and assessed, work-up was performed and treatment was provided in alignment with urgent care protocols and evidence based medicine.   Patient/parent/caregiver has been advised that the patient may require follow up for further testing and/or treatment if the symptoms continue in spite of treatment, as clinically indicated and appropriate. ? ?Rout

## 2022-02-16 NOTE — ED Triage Notes (Signed)
Pt c/o more spotting than normal (pt gets depo injections), and lower back pain. Patient denies other symptoms.  ?

## 2022-02-19 ENCOUNTER — Telehealth (HOSPITAL_COMMUNITY): Payer: Self-pay | Admitting: Physician Assistant

## 2022-02-19 ENCOUNTER — Telehealth (HOSPITAL_COMMUNITY): Payer: Self-pay | Admitting: Emergency Medicine

## 2022-02-19 MED ORDER — DOXYCYCLINE HYCLATE 100 MG PO CAPS: 100.0000 mg | ORAL_CAPSULE | Freq: Two times a day (BID) | ORAL | 0 refills | Status: DC

## 2022-02-19 NOTE — Telephone Encounter (Signed)
Received message from nursing staff that patient was calling with persistent vaginal discomfort and discharge.  Her cervical swab is pending.  Reports that her significant other who is her only sexual partner has tested positive for chlamydia and she is requesting treatment given persistent symptoms.  She had negative pregnancy test when seen 02/16/2022 and is confident she is not pregnant.  Doxycycline was sent to pharmacy given exposure but had nursing staff inform her that she needs to be reevaluated if symptoms do not improve quickly or if anything worsens. ?

## 2022-02-19 NOTE — Telephone Encounter (Signed)
Patient is determined she has been exposed to chlamydia.  Reports her one partner is positive for chlamydia and is certain she needs medicines called in to pharmacy.   ? ?Instructed patient that labs were not back.   ? ?Spoke to [employer], [place].  Will call in script for doxycycline to pharmacy on file.   ? ?Verified 2 identifiers for patient .  Informed of provider decision to call in medication.  Patient denied UTI symptoms.  Instructed patient that if symptoms continued or worsened to return for evaluation.   ?

## 2022-02-20 ENCOUNTER — Telehealth (HOSPITAL_COMMUNITY): Payer: Self-pay | Admitting: Emergency Medicine

## 2022-02-20 LAB — CERVICOVAGINAL ANCILLARY ONLY
Bacterial Vaginitis (gardnerella): POSITIVE — AB
Candida Glabrata: NEGATIVE
Candida Vaginitis: NEGATIVE
Chlamydia: NEGATIVE
Comment: NEGATIVE
Comment: NEGATIVE
Comment: NEGATIVE
Comment: NEGATIVE
Comment: NEGATIVE
Comment: NORMAL
Neisseria Gonorrhea: NEGATIVE
Trichomonas: POSITIVE — AB

## 2022-02-20 NOTE — Telephone Encounter (Signed)
Called [facility] to follow up on cytology results.  They state they have to run the test again and to check in the am.   ?

## 2022-03-13 ENCOUNTER — Ambulatory Visit (INDEPENDENT_AMBULATORY_CARE_PROVIDER_SITE_OTHER): Payer: 59

## 2022-03-13 ENCOUNTER — Ambulatory Visit
Admission: RE | Admit: 2022-03-13 | Discharge: 2022-03-13 | Disposition: A | Discharge status: Home / Self Care | Payer: 59 | Source: Ambulatory Visit | Attending: Emergency Medicine | Admitting: Emergency Medicine

## 2022-03-13 VITALS — BP 158/92 | HR 92 | Temp 97.9°F | Resp 18

## 2022-03-13 DIAGNOSIS — K5900 Constipation, unspecified: Secondary | ICD-10-CM | POA: Diagnosis not present

## 2022-03-13 DIAGNOSIS — M545 Low back pain, unspecified: Secondary | ICD-10-CM | POA: Diagnosis not present

## 2022-03-13 DIAGNOSIS — K5904 Chronic idiopathic constipation: Secondary | ICD-10-CM | POA: Diagnosis not present

## 2022-03-13 DIAGNOSIS — G8929 Other chronic pain: Secondary | ICD-10-CM | POA: Insufficient documentation

## 2022-03-13 DIAGNOSIS — Z113 Encounter for screening for infections with a predominantly sexual mode of transmission: Secondary | ICD-10-CM | POA: Diagnosis not present

## 2022-03-13 LAB — POCT URINALYSIS DIP (MANUAL ENTRY)
Bilirubin, UA: NEGATIVE
Glucose, UA: NEGATIVE mg/dL
Ketones, POC UA: NEGATIVE mg/dL
Leukocytes, UA: NEGATIVE
Nitrite, UA: NEGATIVE
Protein Ur, POC: NEGATIVE mg/dL
Spec Grav, UA: 1.02 (ref 1.010–1.025)
Urobilinogen, UA: 0.2 E.U./dL
pH, UA: 7 (ref 5.0–8.0)

## 2022-03-13 LAB — POCT URINE PREGNANCY: Preg Test, Ur: NEGATIVE

## 2022-03-13 MED ORDER — IBUPROFEN 800 MG PO TABS: 800.0000 mg | ORAL_TABLET | Freq: Three times a day (TID) | ORAL | 0 refills | Status: DC | PRN

## 2022-03-13 MED ORDER — BACLOFEN 10 MG PO TABS: 10.0000 mg | ORAL_TABLET | Freq: Three times a day (TID) | ORAL | 0 refills | Status: AC

## 2022-03-13 MED ORDER — DICLOFENAC SODIUM 1 % EX GEL: 4.0000 g | Freq: Four times a day (QID) | CUTANEOUS | 2 refills | Status: DC

## 2022-03-13 MED ORDER — LINACLOTIDE 145 MCG PO CAPS: 145.0000 ug | ORAL_CAPSULE | Freq: Every day | ORAL | 1 refills | Status: DC

## 2022-03-13 NOTE — ED Provider Notes (Signed)
?[facility] ? ? ? ?CSN: [id number] ?Arrival date & time: 03/13/22  1054 ?  ? ?HISTORY  ? ?Chief Complaint  ?Patient presents with  ? Abdominal Pain  ? Constipation  ? ?HPI ?Sarah Morris is a 41 y.o. female. Pt c/o upper abd pain, she states she felt constipated so she took 4 laxative tablets with no relief. Patient states she does not have a bowel movement as frequently as she believes she should.  Patient states she began to feel pain and pressure in her upper abdomen 4 to 5 days ago.  Patient reports a history of GERD symptoms, currently taking over-the-counter Prilosec which she feels helps.  Patient also complains of continued lower back pain which has not gotten any worse or better since her last visit.  Patient is requesting a refill of baclofen.  Patient denies nausea, vomiting, bloody stool, diarrhea. ? ?The history is provided by the patient.  ?Past Medical History:  ?Diagnosis Date  ? Ectopic pregnancy   ? ?There are no problems to display for this patient. ? ?Past Surgical History:  ?Procedure Laterality Date  ? ABDOMINAL HYSTERECTOMY    ? DILATION AND CURETTAGE OF UTERUS    ? ?OB History   ? ? Gravida  ?3  ? Para  ?   ? Term  ?   ? Preterm  ?   ? AB  ?2  ? Living  ?   ?  ? ? SAB  ?   ? IAB  ?   ? Ectopic  ?2  ? Multiple  ?   ? Live Births  ?   ?   ?  ?  ? ?Home Medications   ? ?Prior to Admission medications   ?Medication Sig Start Date End Date Taking? Authorizing Provider  ?baclofen (LIORESAL) 10 MG tablet Take 1 tablet (10 mg total) by mouth 3 (three) times daily for 7 days. 03/13/22 03/20/22 Yes Lynden Oxford Scales, PA-C  ?diclofenac Sodium (VOLTAREN) 1 % GEL Apply 4 g topically 4 (four) times daily. Apply to affected areas 4 times daily as needed for pain. 03/13/22  Yes Lynden Oxford Scales, PA-C  ?linaclotide [facility]) 145 MCG CAPS capsule Take 1 capsule (145 mcg total) by mouth daily before breakfast. 03/13/22  Yes Lynden Oxford Scales, PA-C  ?medroxyPROGESTERone (DEPO-PROVERA)  150 MG/ML injection INJECT 1 ML INTRAMUSCULAR EVERY 3 MONTHS 90 DAYS 01/15/21 01/15/22  Drema Dallas, DO  ? ? ?Family History ?Family History  ?Problem Relation Age of Onset  ? Hypertension Mother   ? Cancer Father   ? ?Social History ?Social History  ? ?Tobacco Use  ? Smoking status: Every Day  ?  Types: Cigarettes  ? Smokeless tobacco: Never  ?Substance Use Topics  ? Alcohol use: Yes  ?  Comment: occ  ? Drug use: Yes  ?  Types: Marijuana  ? ?Allergies   ?Black pepper-turmeric, Food, Bacitracin-polymyxin b, and Neosporin [neomycin-bacitracin zn-polymyx] ? ?Review of Systems ?Review of Systems ?Pertinent findings noted in history of present illness.  ? ?Physical Exam ?Triage Vital Signs ?ED Triage Vitals  ?Enc Vitals Group  ?   BP 09/14/21 0827 (!) 147/82  ?   Pulse Rate 09/14/21 0827 72  ?   Resp 09/14/21 0827 18  ?   Temp 09/14/21 0827 98.3 ?F (36.8 ?C)  ?   Temp Source 09/14/21 0827 Oral  ?   SpO2 09/14/21 0827 98 %  ?   Weight --   ?   Height --   ?  Head Circumference --   ?   Peak Flow --   ?   Pain Score 09/14/21 0826 5  ?   Pain Loc --   ?   Pain Edu? --   ?   Excl. in [place]? --   ?No data found. ? ?Updated Vital Signs ?BP (!) 158/92 (BP Location: Right Arm)   Pulse 92   Temp 97.9 ?F (36.6 ?C) (Oral)   Resp 18   SpO2 97%  ? ?Physical Exam ?Vitals and nursing note reviewed.  ?Constitutional:   ?   General: She is not in acute distress. ?   Appearance: Normal appearance. She is not ill-appearing.  ?HENT:  ?   Head: Normocephalic and atraumatic.  ?Eyes:  ?   General: Lids are normal.     ?   Right eye: No discharge.     ?   Left eye: No discharge.  ?   Extraocular Movements: Extraocular movements intact.  ?   Conjunctiva/sclera: Conjunctivae normal.  ?   Right eye: Right conjunctiva is not injected.  ?   Left eye: Left conjunctiva is not injected.  ?Neck:  ?   Trachea: Trachea and phonation normal.  ?Cardiovascular:  ?   Rate and Rhythm: Normal rate and regular rhythm.  ?   Pulses: Normal pulses.  ?   Heart  sounds: Normal heart sounds. No murmur heard. ?  No friction rub. No gallop.  ?Pulmonary:  ?   Effort: Pulmonary effort is normal. No accessory muscle usage, prolonged expiration or respiratory distress.  ?   Breath sounds: Normal breath sounds. No stridor, decreased air movement or transmitted upper airway sounds. No decreased breath sounds, wheezing, rhonchi or rales.  ?Chest:  ?   Chest wall: No tenderness.  ?Abdominal:  ?   General: Abdomen is flat. Bowel sounds are decreased. There is no distension.  ?   Palpations: Abdomen is soft. There is no shifting dullness, fluid wave, hepatomegaly, splenomegaly, mass or pulsatile mass.  ?   Tenderness: There is abdominal tenderness in the epigastric area and periumbilical area. There is no right CVA tenderness or left CVA tenderness.  ?   Hernia: No hernia is present.  ?Musculoskeletal:     ?   General: Normal range of motion.  ?   Cervical back: Normal range of motion and neck supple. Normal range of motion.  ?Lymphadenopathy:  ?   Cervical: No cervical adenopathy.  ?Skin: ?   General: Skin is warm and dry.  ?   Findings: No erythema or rash.  ?Neurological:  ?   General: No focal deficit present.  ?   Mental Status: She is alert and oriented to person, place, and time.  ?Psychiatric:     ?   Mood and Affect: Mood normal.     ?   Behavior: Behavior normal.  ? ? ?Visual Acuity ?Right Eye Distance:   ?Left Eye Distance:   ?Bilateral Distance:   ? ?Right Eye Near:   ?Left Eye Near:    ?Bilateral Near:    ? ?UC Couse / Diagnostics / Procedures:  ?  ?EKG ? ?Radiology ?No results found. ? ?Procedures ?Procedures (including critical care time) ? ?UC Diagnoses / Final Clinical Impressions(s)   ?I have reviewed the triage vital signs and the nursing notes. ? ?Pertinent labs & imaging results that were available during my care of the patient were reviewed by me and considered in my medical decision making (see chart for details).   ? ?  Final diagnoses:  ?Chronic low back pain,  unspecified back pain laterality, unspecified whether sciatica present  ?Screening examination for STD (sexually transmitted disease)  ?Chronic idiopathic constipation  ? ?KUB concerning for excess of bowel and gas in her upper abdomen likely related to constipation and correlates with patient's identified area of discomfort.  Patient advised to begin Linzess, prescription provided.  I also provided patient with a prescription refill of baclofen and advised her to use diclofenac gel as needed for lower back.  Patient also advised that she continue continue ibuprofen 4 pain unrelieved by diclofenac and baclofen.  Return precautions advised. ? ?ED Prescriptions   ? ? Medication Sig Dispense Auth. Provider  ? linaclotide (LINZESS) 145 MCG CAPS capsule Take 1 capsule (145 mcg total) by mouth daily before breakfast. 30 capsule Lynden Oxford Scales, PA-C  ? diclofenac Sodium (VOLTAREN) 1 % GEL Apply 4 g topically 4 (four) times daily. Apply to affected areas 4 times daily as needed for pain. 100 g Lynden Oxford Scales, PA-C  ? baclofen (LIORESAL) 10 MG tablet Take 1 tablet (10 mg total) by mouth 3 (three) times daily for 7 days. 21 tablet Lynden Oxford Scales, PA-C  ? ?  ? ?PDMP not reviewed this encounter. ? ?Pending results:  ?Labs Reviewed  ?POCT URINALYSIS DIP (MANUAL ENTRY) - Abnormal; Notable for the following components:  ?    Result Value  ? Blood, UA small (*)   ? All other components within normal limits  ?POCT URINE PREGNANCY  ?CERVICOVAGINAL ANCILLARY ONLY  ? ? ?Medications Ordered in UC: ?Medications - No data to display ? ?Disposition Upon Discharge:  ?Condition: stable for discharge home ?Home: take medications as prescribed; routine discharge instructions as discussed; follow up as advised. ? ?Patient presented with an acute illness with associated systemic symptoms and significant discomfort requiring urgent management. In my opinion, this is a condition that a prudent lay person (someone who  possesses an average knowledge of health and medicine) may potentially expect to result in complications if not addressed urgently such as respiratory distress, impairment of bodily function or dysfunction of bodily

## 2022-03-13 NOTE — ED Triage Notes (Signed)
Pt c/o upper abd pain, she states she felt constipated so she took 4 laxative tablets with no relief. Patient states she does not have a bowel movement as frequently as she should. ?Started: 4-5 days ago  ?

## 2022-03-13 NOTE — Discharge Instructions (Addendum)
As anticipated, your abdominal x-ray shows that you do have a significant amount of stool and gas in your upper abdomen.  I provided you with a prescription for a very good constipation medication called Linzess. Please plan to take this about 30 minutes before meal and plan to be at home or at least near a toilet that you can use frequently as needed a few hours after you take this medication.  Linzess can be taken daily but if you would rather take it "as needed" for episodes such as this, that will work well, too. ? ?Your spine appeared normal on x-ray.  Provided you with a renewal of baclofen and changed your anti-inflammatory pain medication to ibuprofen 800 mg.  Beyond that, I also recommend that you apply a topical anti-inflammatory called diclofenac gel which I have also prescribed for you.  You can use the topical gel 4 times daily as needed.  I often find this more effective than taking ibuprofen or naproxen.  If you find that these are unhelpful, the neck step would be to follow-up with your primary care provider to discuss referral to you for physical therapy or a spine specialist for further evaluation and treatment.  We do not prescribe controlled substances or provide chronic pain management here at urgent care. ? ?Your urinalysis today was normal.  The results of your STD swab (BV test) will be posted to your MyChart once complete and if there is a positive finding, you will be contacted by phone with the appropriate treatment. ? ?Thank you for visiting urgent care today.  As always, I appreciate the opportunity participate in your care. ?

## 2022-03-14 ENCOUNTER — Telehealth: Payer: Self-pay | Admitting: Emergency Medicine

## 2022-03-14 ENCOUNTER — Telehealth (HOSPITAL_COMMUNITY): Payer: Self-pay | Admitting: Emergency Medicine

## 2022-03-14 DIAGNOSIS — K5904 Chronic idiopathic constipation: Secondary | ICD-10-CM

## 2022-03-14 LAB — CERVICOVAGINAL ANCILLARY ONLY
Bacterial Vaginitis (gardnerella): POSITIVE — AB
Candida Glabrata: NEGATIVE
Candida Vaginitis: NEGATIVE
Chlamydia: NEGATIVE
Comment: NEGATIVE
Comment: NEGATIVE
Comment: NEGATIVE
Comment: NEGATIVE
Comment: NEGATIVE
Comment: NORMAL
Neisseria Gonorrhea: NEGATIVE
Trichomonas: NEGATIVE

## 2022-03-14 MED ORDER — TRULANCE 3 MG PO TABS: 1.0000 | ORAL_TABLET | Freq: Every day | ORAL | 0 refills | Status: DC

## 2022-03-14 MED ORDER — METRONIDAZOLE 500 MG PO TABS: 500.0000 mg | ORAL_TABLET | Freq: Two times a day (BID) | ORAL | 0 refills | Status: DC

## 2022-03-14 NOTE — Telephone Encounter (Signed)
Patient states that her insurance would not cover Linzess adequately, even with coupon her cost was $600.  Medication was switched to Trulance.  Patient advised that if Trulance is not covered or is also too expensive, there really are not any other options other than over-the-counter laxatives. ?

## 2022-04-05 ENCOUNTER — Ambulatory Visit (HOSPITAL_COMMUNITY)
Admission: RE | Admit: 2022-04-05 | Discharge: 2022-04-05 | Disposition: A | Discharge status: Home / Self Care | Payer: 59 | Source: Ambulatory Visit | Attending: Student | Admitting: Student

## 2022-04-05 ENCOUNTER — Encounter: Payer: Self-pay | Admitting: Nurse Practitioner

## 2022-04-05 ENCOUNTER — Encounter (HOSPITAL_COMMUNITY): Payer: Self-pay

## 2022-04-05 VITALS — BP 113/85 | HR 92 | Temp 98.3°F | Resp 18

## 2022-04-05 DIAGNOSIS — R1013 Epigastric pain: Secondary | ICD-10-CM

## 2022-04-05 DIAGNOSIS — G8929 Other chronic pain: Secondary | ICD-10-CM

## 2022-04-05 NOTE — ED Triage Notes (Addendum)
Pt is present today with concerns of abdominal pain. Pt states that her stomach feels tight and hard below her sternum. Pt states that she is abe to have a regular daily BM but due to her being uncomfortable she cannot eat properly and feels she is losing weight

## 2022-04-05 NOTE — ED Provider Notes (Signed)
[place]    CSN: [id number] Arrival date & time: 04/05/22  5374      History   Chief Complaint Chief Complaint  Patient presents with   Abdominal Pain    My stomach still bloated short of breath - Entered by patient    HPI Sarah Morris is a 41 y.o. female presenting with continued epigastric pain and bloating.  History abdominal hysterectomy.  We last saw her on 03/13/2022 for this, this is apparently her fourth visit to urgent care or the emergency department for abdominal pain.  She states that at the last visit, they thought that this was related to constipation, but she is now doing a daily bowel movement with the help of MiraLAX. She could not afford the Linzess prescribed last visit. Bowel movements are not bloody, and there is no mucus.  No associated nausea, vomiting, epigastric burning, pain radiating up the esophagus.  She states that her stomach starts to hurt throughout the day, particularly in the afternoon.  The pain is sharp rather than burning or crampy.  There is no associated nausea, vomiting, diarrhea.  States nothing has helped.  She denies excessive NSAIDs, alcohol, spicy foods, caffeine.  She is very frustrated that this is her fourth visit to the urgent care for abdominal pain and her symptoms persist.  She does not have a PCP.  HPI  Past Medical History:  Diagnosis Date   Ectopic pregnancy     There are no problems to display for this patient.   Past Surgical History:  Procedure Laterality Date   ABDOMINAL HYSTERECTOMY     DILATION AND CURETTAGE OF UTERUS      OB History     Gravida  3   Para      Term      Preterm      AB  2   Living         SAB      IAB      Ectopic  2   Multiple      Live Births               Home Medications    Prior to Admission medications   Medication Sig Start Date End Date Taking? Authorizing Provider  diclofenac Sodium (VOLTAREN) 1 % GEL Apply 4 g topically 4 (four) times daily.  Apply to affected areas 4 times daily as needed for pain. 03/13/22   Lynden Oxford Scales, PA-C  ibuprofen (ADVIL) 800 MG tablet Take 1 tablet (800 mg total) by mouth every 8 (eight) hours as needed for up to 21 doses for fever, headache, mild pain or moderate pain. 03/13/22   Lynden Oxford Scales, PA-C  linaclotide [facility]) 145 MCG CAPS capsule Take 1 capsule (145 mcg total) by mouth daily before breakfast. 03/13/22   Lynden Oxford Scales, PA-C  medroxyPROGESTERone (DEPO-PROVERA) 150 MG/ML injection INJECT 1 ML INTRAMUSCULAR EVERY 3 MONTHS 90 DAYS 01/15/21 01/15/22  Drema Dallas, DO  metroNIDAZOLE (FLAGYL) 500 MG tablet Take 1 tablet (500 mg total) by mouth 2 (two) times daily. 03/14/22   LampteyMyrene Galas, MD  Plecanatide (TRULANCE) 3 MG TABS Take 1 tablet by mouth daily before breakfast. 03/14/22   Lynden Oxford Scales, PA-C    Family History Family History  Problem Relation Age of Onset   Hypertension Mother    Cancer Father     Social History Social History   Tobacco Use   Smoking status: Every Day    Types:  Cigarettes   Smokeless tobacco: Never  Substance Use Topics   Alcohol use: Yes    Comment: occ   Drug use: Yes    Types: Marijuana     Allergies   Black pepper-turmeric, Food, Bacitracin-polymyxin b, and Neosporin [neomycin-bacitracin zn-polymyx]   Review of Systems Review of Systems  Constitutional:  Negative for appetite change, chills, diaphoresis, fever and unexpected weight change.  HENT:  Negative for congestion, ear pain, sinus pressure, sinus pain, sneezing, sore throat and trouble swallowing.   Respiratory:  Negative for cough, chest tightness and shortness of breath.   Cardiovascular:  Negative for chest pain.  Gastrointestinal:  Positive for abdominal pain. Negative for abdominal distention, anal bleeding, blood in stool, constipation, diarrhea, nausea, rectal pain and vomiting.  Genitourinary:  Negative for dysuria, flank pain, frequency and urgency.   Musculoskeletal:  Negative for back pain and myalgias.  Neurological:  Negative for dizziness, light-headedness and headaches.  All other systems reviewed and are negative.   Physical Exam Triage Vital Signs ED Triage Vitals  Enc Vitals Group     BP 04/05/22 1021 113/85     Pulse Rate 04/05/22 1021 92     Resp 04/05/22 1021 18     Temp 04/05/22 1021 98.3 F (36.8 C)     Temp src --      SpO2 04/05/22 1021 100 %     Weight --      Height --      Head Circumference --      Peak Flow --      Pain Score 04/05/22 1023 8     Pain Loc --      Pain Edu? --      Excl. in [place]? --    No data found.  Updated Vital Signs BP 113/85   Pulse 92   Temp 98.3 F (36.8 C)   Resp 18   SpO2 100%   Breastfeeding No   Visual Acuity Right Eye Distance:   Left Eye Distance:   Bilateral Distance:    Right Eye Near:   Left Eye Near:    Bilateral Near:     Physical Exam Vitals reviewed.  Constitutional:      General: She is not in acute distress.    Appearance: Normal appearance. She is not ill-appearing.  HENT:     Head: Normocephalic and atraumatic.     Mouth/Throat:     Mouth: Mucous membranes are moist.     Comments: Moist mucous membranes Eyes:     Extraocular Movements: Extraocular movements intact.     Pupils: Pupils are equal, round, and reactive to light.  Cardiovascular:     Rate and Rhythm: Normal rate and regular rhythm.     Heart sounds: Normal heart sounds.  Pulmonary:     Effort: Pulmonary effort is normal.     Breath sounds: Normal breath sounds. No wheezing, rhonchi or rales.  Abdominal:     General: Bowel sounds are normal. There is no distension.     Palpations: Abdomen is soft. There is no mass.     Tenderness: There is abdominal tenderness in the epigastric area. There is no right CVA tenderness, left CVA tenderness, guarding or rebound. Negative signs include Murphy's sign, Rovsing's sign and McBurney's sign.     Comments: Minimal epigastric pain to  palpation, and no guarding or rebound.   Skin:    General: Skin is warm.     Capillary Refill: Capillary refill takes less than 2  seconds.     Comments: Good skin turgor  Neurological:     General: No focal deficit present.     Mental Status: She is alert and oriented to person, place, and time.  Psychiatric:        Mood and Affect: Mood normal.        Behavior: Behavior normal.     UC Treatments / Results  Labs (all labs ordered are listed, but only abnormal results are displayed) Labs Reviewed - No data to display  EKG   Radiology No results found.  Procedures Procedures (including critical care time)  Medications Ordered in UC Medications - No data to display  Initial Impression / Assessment and Plan / UC Course  I have reviewed the triage vital signs and the nursing notes.  Pertinent labs & imaging results that were available during my care of the patient were reviewed by me and considered in my medical decision making (see chart for details).     This patient is a very pleasant 41 y.o. year old female presenting with epigastric pain for months, related to chronic constipation. She is now passing daily BM with Miralax, but pain persists. Low suspicion for acute abdomen - this is not associated with hematemesis, melena, hematochezia, severe pain, RUQ pain, or RLQ pain. As this is her fourth visit to UC or ED for this complaint, I went ahead and sent a referral to GI. History hysterectomy. Strict ED return precautions discussed. Patient verbalizes understanding and agreement.    Final Clinical Impressions(s) / UC Diagnoses   Final diagnoses:  Abdominal pain, chronic, epigastric     Discharge Instructions      -I sent a referral for you to establish care with a gastroenterologist. Their information is at the bottom of this paper. -Try to limit foods and drinks that will irritate your stomach further. This includes spicy foods, fatty or greasy foods, chocolate,  acidic foods like orange juice, caffeine like coffee, alcohol, NSAIDs including ibuprofen. -Head to the emergency department if symptoms worsen despite treatment, including worsening abdominal pain, you vomit blood, there is blood in your poop, etc.    ED Prescriptions   None    PDMP not reviewed this encounter.   Hazel Sams, PA-C 04/05/22 1138

## 2022-04-05 NOTE — Discharge Instructions (Addendum)
-  I sent a referral for you to establish care with a gastroenterologist. Their information is at the bottom of this paper. -Try to limit foods and drinks that will irritate your stomach further. This includes spicy foods, fatty or greasy foods, chocolate, acidic foods like orange juice, caffeine like coffee, alcohol, NSAIDs including ibuprofen. -Head to the emergency department if symptoms worsen despite treatment, including worsening abdominal pain, you vomit blood, there is blood in your poop, etc.

## 2022-04-09 ENCOUNTER — Encounter (HOSPITAL_COMMUNITY): Payer: Self-pay

## 2022-04-09 ENCOUNTER — Ambulatory Visit (HOSPITAL_COMMUNITY)
Admission: RE | Admit: 2022-04-09 | Discharge: 2022-04-09 | Disposition: A | Discharge status: Home / Self Care | Payer: 59 | Source: Ambulatory Visit | Attending: Internal Medicine | Admitting: Internal Medicine

## 2022-04-09 VITALS — BP 113/69 | HR 96 | Temp 99.1°F | Resp 16

## 2022-04-09 DIAGNOSIS — H9201 Otalgia, right ear: Secondary | ICD-10-CM | POA: Diagnosis not present

## 2022-04-09 DIAGNOSIS — S00411A Abrasion of right ear, initial encounter: Secondary | ICD-10-CM

## 2022-04-09 MED ORDER — IBUPROFEN 600 MG PO TABS: 600.0000 mg | ORAL_TABLET | Freq: Four times a day (QID) | ORAL | 0 refills | Status: DC | PRN

## 2022-04-09 MED ORDER — OFLOXACIN 0.3 % OT SOLN: 5.0000 [drp] | Freq: Two times a day (BID) | OTIC | 0 refills | Status: DC

## 2022-04-09 NOTE — Discharge Instructions (Addendum)
You were seen in urgent care today for the abrasion to your ear canal.  I would like for you to start taking ofloxacin eardrops to your right ear twice a day for 1 week to prevent infection.  You may take ibuprofen 600 mg every 6 hours as needed for ear pain.   Stop using Bobby pins to clean your ears do not put anything in your ears for the next 7 days as your abrasion heals.  After this, you may use Q-tips to the outside of your ear canal sparingly after a warm shower so that the wax is loosened.  If you develop any new or worsening symptoms or do not improve in the next 2 to 3 days, please return.  If your symptoms are severe, please go to the emergency room.  Follow-up with your primary care provider for further evaluation and management of your symptoms as well as ongoing wellness visits.  I hope you feel better!

## 2022-04-09 NOTE — ED Triage Notes (Signed)
Pt states right ear pain for the past 2 days.

## 2022-04-09 NOTE — ED Provider Notes (Signed)
[place]    CSN: [id number] Arrival date & time: 04/09/22  1420      History   Chief Complaint Chief Complaint  Patient presents with   Ear Injury    Sore or ear infection - Entered by patient   Otalgia    HPI Sarah Morris is a 41 y.o. female.   Patient presents to urgent care with complaint of right ear pain for the last 3 days.  She reports using a Bobby pin to attempt to remove earwax from her right ear.  Pain is to the ear canal, but made worse by pressing on her mastoid process.  She was seen and treated for an ear infection 6 months ago to her right ear with the same cause of her pain.  She denies hearing loss bilaterally, headache, eye pain, sore throat, dizziness, and fever.  Denies any other aggravating or relieving factors to symptoms at this time.   Otalgia  Past Medical History:  Diagnosis Date   Ectopic pregnancy     There are no problems to display for this patient.   Past Surgical History:  Procedure Laterality Date   ABDOMINAL HYSTERECTOMY     DILATION AND CURETTAGE OF UTERUS      OB History     Gravida  3   Para      Term      Preterm      AB  2   Living         SAB      IAB      Ectopic  2   Multiple      Live Births               Home Medications    Prior to Admission medications   Medication Sig Start Date End Date Taking? Authorizing Provider  ofloxacin (FLOXIN) 0.3 % OTIC solution Place 5 drops into the right ear 2 (two) times daily. 04/09/22  Yes Talbot Grumbling, FNP  diclofenac Sodium (VOLTAREN) 1 % GEL Apply 4 g topically 4 (four) times daily. Apply to affected areas 4 times daily as needed for pain. 03/13/22   Lynden Oxford Scales, PA-C  ibuprofen (ADVIL) 600 MG tablet Take 1 tablet (600 mg total) by mouth every 6 (six) hours as needed for up to 21 doses for fever, headache, mild pain or moderate pain. 04/09/22   Talbot Grumbling, FNP  linaclotide [facility]) 145 MCG CAPS capsule Take 1  capsule (145 mcg total) by mouth daily before breakfast. 03/13/22   Lynden Oxford Scales, PA-C  medroxyPROGESTERone (DEPO-PROVERA) 150 MG/ML injection INJECT 1 ML INTRAMUSCULAR EVERY 3 MONTHS 90 DAYS 01/15/21 01/15/22  Drema Dallas, DO  metroNIDAZOLE (FLAGYL) 500 MG tablet Take 1 tablet (500 mg total) by mouth 2 (two) times daily. 03/14/22   LampteyMyrene Galas, MD  Plecanatide (TRULANCE) 3 MG TABS Take 1 tablet by mouth daily before breakfast. 03/14/22   Lynden Oxford Scales, PA-C    Family History Family History  Problem Relation Age of Onset   Hypertension Mother    Cancer Father     Social History Social History   Tobacco Use   Smoking status: Every Day    Types: Cigarettes   Smokeless tobacco: Never  Substance Use Topics   Alcohol use: Yes    Comment: occ   Drug use: Yes    Types: Marijuana     Allergies   Black pepper-turmeric, Food, Bacitracin-polymyxin b, and Neosporin [neomycin-bacitracin zn-polymyx]  Review of Systems Review of Systems  HENT:  Positive for ear pain.   Per HPI  Physical Exam Triage Vital Signs ED Triage Vitals  Enc Vitals Group     BP 04/09/22 1431 113/69     Pulse Rate 04/09/22 1431 96     Resp 04/09/22 1431 16     Temp 04/09/22 1435 99.1 F (37.3 C)     Temp Source 04/09/22 1435 Oral     SpO2 04/09/22 1431 98 %     Weight --      Height --      Head Circumference --      Peak Flow --      Pain Score 04/09/22 1431 6     Pain Loc --      Pain Edu? --      Excl. in [place]? --    No data found.  Updated Vital Signs BP 113/69 (BP Location: Left Arm)   Pulse 96   Temp 99.1 F (37.3 C) (Oral)   Resp 16   SpO2 98%   Visual Acuity Right Eye Distance:   Left Eye Distance:   Bilateral Distance:    Right Eye Near:   Left Eye Near:    Bilateral Near:     Physical Exam Constitutional:      General: She is not in acute distress.    Appearance: Normal appearance. She is normal weight. She is not ill-appearing, toxic-appearing or  diaphoretic.  HENT:     Right Ear: Tympanic membrane and external ear normal. There is no impacted cerumen.     Left Ear: Tympanic membrane, ear canal and external ear normal. There is no impacted cerumen.     Ears:     Comments: Small abrasion to right ear canal.  No drainage noted to abrasion.  Abrasion appears erythematous.  No foreign body visualized bilaterally to ears.    Nose: Nose normal. No rhinorrhea.     Mouth/Throat:     Pharynx: No posterior oropharyngeal erythema.  Eyes:     Extraocular Movements: Extraocular movements intact.     Pupils: Pupils are equal, round, and reactive to light.  Cardiovascular:     Heart sounds: Normal heart sounds. No murmur heard.   No friction rub. No gallop.  Pulmonary:     Effort: Pulmonary effort is normal. No respiratory distress.     Breath sounds: No stridor. No wheezing, rhonchi or rales.  Chest:     Chest wall: No tenderness.  Musculoskeletal:        General: Normal range of motion.     Cervical back: Normal range of motion.  Lymphadenopathy:     Cervical: No cervical adenopathy.  Skin:    Capillary Refill: Capillary refill takes less than 2 seconds.  Neurological:     General: No focal deficit present.     Mental Status: She is alert. Mental status is at baseline.  Psychiatric:        Mood and Affect: Mood normal.        Behavior: Behavior normal.        Thought Content: Thought content normal.        Judgment: Judgment normal.     UC Treatments / Results  Labs (all labs ordered are listed, but only abnormal results are displayed) Labs Reviewed - No data to display  EKG   Radiology No results found.  Procedures Procedures (including critical care time)  Medications Ordered in UC Medications - No data to  display  Initial Impression / Assessment and Plan / UC Course  I have reviewed the triage vital signs and the nursing notes.  Pertinent labs & imaging results that were available during my care of the patient  were reviewed by me and considered in my medical decision making (see chart for details).  Patient to take ofloxacin eardrops for the next 7 days twice daily to her right ear to prevent otitis media.  She may take Tylenol and ibuprofen every 6 hours as needed for pain.  Patient to follow-up with primary care/urgent care regarding ear pain if it is worsening in the next couple of days despite eardrop administration. Instructed patient to not insert any more Bobby pins or Q-tips into her ears to remove wax.  She may use over-the-counter Debrox eardrops instead.  Counseled patient regarding appropriate use of medications and potential side effects for all medications recommended or prescribed today. Discussed red flag signs and symptoms of worsening condition,when to call the PCP office, return to urgent care, and when to seek higher level of care. Patient verbalizes understanding and agreement with plan. All questions answered. Patient discharged in stable condition.   Final Clinical Impressions(s) / UC Diagnoses   Final diagnoses:  Ear canal abrasion, right, initial encounter  Right ear pain     Discharge Instructions      You were seen in urgent care today for the abrasion to your ear canal.  I would like for you to start taking ofloxacin eardrops to your right ear twice a day for 1 week to prevent infection.  You may take ibuprofen 600 mg every 6 hours as needed for ear pain.   Stop using Bobby pins to clean your ears do not put anything in your ears for the next 7 days as your abrasion heals.  After this, you may use Q-tips to the outside of your ear canal sparingly after a warm shower so that the wax is loosened.  If you develop any new or worsening symptoms or do not improve in the next 2 to 3 days, please return.  If your symptoms are severe, please go to the emergency room.  Follow-up with your primary care provider for further evaluation and management of your symptoms as well as ongoing  wellness visits.  I hope you feel better!   ED Prescriptions     Medication Sig Dispense Auth. Provider   ibuprofen (ADVIL) 600 MG tablet  (Status: Discontinued) Take 1 tablet (600 mg total) by mouth every 6 (six) hours as needed. 30 tablet Joella Prince M, FNP   ibuprofen (ADVIL) 600 MG tablet Take 1 tablet (600 mg total) by mouth every 6 (six) hours as needed for up to 21 doses for fever, headache, mild pain or moderate pain. 21 tablet Joella Prince M, FNP   ofloxacin (FLOXIN) 0.3 % OTIC solution Place 5 drops into the right ear 2 (two) times daily. 5 mL Talbot Grumbling, FNP      PDMP not reviewed this encounter.   Talbot Grumbling, [place] 04/09/22 1545

## 2022-04-28 NOTE — Progress Notes (Unsigned)
04/28/2022 Sarah Morris [id number] Apr 13, 1981   CHIEF COMPLAINT: upper abdominal pain, constipation   HISTORY OF PRESENT ILLNESS: Sarah Morris. Sarah Morris is a 41 year old female with a past medical history of chronic constipation. She presents to our office today as referred by Sarah Roberts PA-C for further evaluation regarding epigastric pain.  She does not have a PCP at this time.  She presented to the urgent care clinic 03/13/2022 due to having upper abdominal pain associated with constipation.  At that time, she reported going 4 to 5 days without passing a bowel movement for the past 2 months.  An abdominal x-ray was done which showed stool in the proximal half of the colon.  She was prescribed Linzess 145 mcg daily and was discharged home.  She was unable to obtain Linzess as it was not covered by her insurance and the cost was prohibitive.  She took MiraLAX, a stool softener and digestive enzyme and her constipation significantly improved.  She denies having any further constipation issues for the past 2 weeks.  She is passing fairly normal brown formed or softer stool daily.  No rectal bleeding or black stools.  She continues to have epigastric pain which occurs daily but is not triggered by eating.  No nausea or vomiting.  She complains of upper abdominal bloat as well.  No dysphagia.  She has heartburn once weekly which is relieved by taking Tums.  She tries to burp but cannot.  She sometimes feels as difficult to take a deep breath then when her upper abdomen is bloated.  She is able to eat normal portions of foods some days and other days she gets full easily.  She reports losing 13 pounds over the past few months.  She previously drank 5-7 beers daily for the past 20 years.  Over the past few months, she has decreased her beer intake and denies having any alcohol for the past month.  She smokes cigarettes 1 pack/day.  She smokes marijuana daily.  She denies ever having an EGD.  She underwent  a colonoscopy 10 years in [place] which was done possibly due to IBS symptoms which she reported was normal.  Labs 05/11/2021: WBC 7.1.  Hemoglobin 13.4.  Hematocrit 39.2.  Platelet 321.  TSH 1.63.   Abdominal xray 03/13/2022: ABDOMEN - 1 VIEW  COMPARISON:  None  FINDINGS: Increased stool in proximal half of colon.  Nonobstructive bowel gas pattern without bowel dilatation or bowel wall thickening.  Osseous structures normal.  No pathologic calcifications.  IMPRESSION: Increased stool in proximal half of colon    Past Medical History:  Diagnosis Date   Ectopic pregnancy    Past Surgical History:  Procedure Laterality Date   ABDOMINAL HYSTERECTOMY     DILATION AND CURETTAGE OF UTERUS    Right and Left foot surgery. Right neck cyst removal age 10   Social History: She is single.  See HPI regarding tobacco/alcohol use.  Family History: Father had some form of cancer, further details unclear maternal grandfather lung cancer.    Allergies  Allergen Reactions   Black Pepper-Turmeric Anaphylaxis   Food Swelling    Black pepper   Bacitracin-Polymyxin B Rash   Neosporin [Neomycin-Bacitracin Zn-Polymyx] Rash      Outpatient Encounter Medications as of 04/29/2022  Medication Sig   diclofenac Sodium (VOLTAREN) 1 % GEL Apply 4 g topically 4 (four) times daily. Apply to affected areas 4 times daily as needed for pain.   ibuprofen (  ADVIL) 600 MG tablet Take 1 tablet (600 mg total) by mouth every 6 (six) hours as needed for up to 21 doses for fever, headache, mild pain or moderate pain.   linaclotide (LINZESS) 145 MCG CAPS capsule Take 1 capsule (145 mcg total) by mouth daily before breakfast.   medroxyPROGESTERone (DEPO-PROVERA) 150 MG/ML injection INJECT 1 ML INTRAMUSCULAR EVERY 3 MONTHS 90 DAYS   metroNIDAZOLE (FLAGYL) 500 MG tablet Take 1 tablet (500 mg total) by mouth 2 (two) times daily.   ofloxacin (FLOXIN) 0.3 % OTIC solution Place 5 drops into the right ear 2 (two) times  daily.   Plecanatide (TRULANCE) 3 MG TABS Take 1 tablet by mouth daily before breakfast.   No facility-administered encounter medications on file as of 04/29/2022.    REVIEW OF SYSTEMS: Gen: See HPI. CV: Denies chest pain, palpitations or edema. Resp: See HPI. Denies cough or hemoptysis.  GI: See HPI for  GU : 2 UTI's past year. Denies urinary burning, blood in urine, increased urinary frequency or incontinence. MS: Denies joint pain, muscles aches or weakness. Derm: Denies rash, itchiness, skin lesions or unhealing ulcers. Psych: Denies depression, anxiety, memory loss or confusion.  Heme: Denies bruising, easy bleeding. Neuro:  Denies headaches, dizziness or paresthesias. Endo:  Denies any problems with DM, thyroid or adrenal function.  PHYSICAL EXAM: BP 100/70   Pulse 97   Ht '5\' 2"'$  (1.575 m)   Wt 116 lb (52.6 kg)   BMI 21.22 kg/m   General: 41 year old female in NAD.  Head: Normocephalic and atraumatic. Eyes:  Sclerae non-icteric, conjunctive pink. Ears: Normal auditory acuity. Mouth: Dentition intact. No ulcers or lesions.  Neck: Supple, no lymphadenopathy or thyromegaly.  Lungs: Clear bilaterally to auscultation without wheezes, crackles or rhonchi. Heart: Regular rate and rhythm. No murmur, rub or gallop appreciated.  Abdomen: Soft, nontender, non distended. No masses. No hepatosplenomegaly. Normoactive bowel sounds x 4 quadrants.  Rectal: Deferred. Musculoskeletal: Symmetrical with no gross deformities. Skin: Warm and dry. No rash or lesions on visible extremities. Extremities: No edema. Neurological: Alert oriented x 4, no focal deficits.  Psychological:  Alert and cooperative. Normal mood and affect.  ASSESSMENT AND PLAN:  56) 41 year old female with epigastric pain and bloat x 2 months. Heartburn once weekly.  -CBC, CMP -CTAP with oral and IV contrast  -EGD benefits and risks discussed including risk with sedation, risk of bleeding, perforation and infection.  Schedule EGD +/- colonoscopy after CTAP results reviewed.   2) Chronic constipation, improved after she took Miralax, a stool softener and a digestive enzyme -See plan in # 1 -Miralax Q HS as needed   3) Weight loss with night sweats  -See plan in # 1  4) Alcohol use disorder  -Advised strict alcohol abstinence   5) Chronic tobacco use  -Smoking cessation recommended  -Consider chest CT, defer to PCP   I advised the patient to obtain a primary care physician     CC:  [facility]

## 2022-04-29 ENCOUNTER — Encounter: Payer: Self-pay | Admitting: Nurse Practitioner

## 2022-04-29 ENCOUNTER — Ambulatory Visit (INDEPENDENT_AMBULATORY_CARE_PROVIDER_SITE_OTHER): Payer: 59 | Admitting: Nurse Practitioner

## 2022-04-29 ENCOUNTER — Other Ambulatory Visit (INDEPENDENT_AMBULATORY_CARE_PROVIDER_SITE_OTHER): Payer: 59

## 2022-04-29 VITALS — BP 100/70 | HR 97 | Ht 62.0 in | Wt 116.0 lb

## 2022-04-29 DIAGNOSIS — K5900 Constipation, unspecified: Secondary | ICD-10-CM

## 2022-04-29 DIAGNOSIS — R1013 Epigastric pain: Secondary | ICD-10-CM

## 2022-04-29 DIAGNOSIS — R61 Generalized hyperhidrosis: Secondary | ICD-10-CM | POA: Diagnosis not present

## 2022-04-29 DIAGNOSIS — R634 Abnormal weight loss: Secondary | ICD-10-CM

## 2022-04-29 LAB — CBC WITH DIFFERENTIAL/PLATELET
Basophils Absolute: 0.1 10*3/uL (ref 0.0–0.1)
Basophils Relative: 0.7 % (ref 0.0–3.0)
Eosinophils Absolute: 0.3 10*3/uL (ref 0.0–0.7)
Eosinophils Relative: 3.5 % (ref 0.0–5.0)
HCT: 38.7 % (ref 36.0–46.0)
Hemoglobin: 13.2 g/dL (ref 12.0–15.0)
Lymphocytes Relative: 22.9 % (ref 12.0–46.0)
Lymphs Abs: 1.7 10*3/uL (ref 0.7–4.0)
MCHC: 34.2 g/dL (ref 30.0–36.0)
MCV: 103.3 fl — ABNORMAL HIGH (ref 78.0–100.0)
Monocytes Absolute: 0.5 10*3/uL (ref 0.1–1.0)
Monocytes Relative: 6.7 % (ref 3.0–12.0)
Neutro Abs: 4.9 10*3/uL (ref 1.4–7.7)
Neutrophils Relative %: 66.2 % (ref 43.0–77.0)
Platelets: 289 10*3/uL (ref 150.0–400.0)
RBC: 3.75 Mil/uL — ABNORMAL LOW (ref 3.87–5.11)
RDW: 13.1 % (ref 11.5–15.5)
WBC: 7.4 10*3/uL (ref 4.0–10.5)

## 2022-04-29 LAB — COMPREHENSIVE METABOLIC PANEL
ALT: 11 U/L (ref 0–35)
AST: 16 U/L (ref 0–37)
Albumin: 4.5 g/dL (ref 3.5–5.2)
Alkaline Phosphatase: 69 U/L (ref 39–117)
BUN: 9 mg/dL (ref 6–23)
CO2: 27 mEq/L (ref 19–32)
Calcium: 9.9 mg/dL (ref 8.4–10.5)
Chloride: 105 mEq/L (ref 96–112)
Creatinine, Ser: 0.93 mg/dL (ref 0.40–1.20)
GFR: 76.2 mL/min (ref >60.00)
Glucose, Bld: 94 mg/dL (ref 70–99)
Potassium: 3.8 mEq/L (ref 3.5–5.1)
Sodium: 140 mEq/L (ref 135–145)
Total Bilirubin: 0.7 mg/dL (ref 0.2–1.2)
Total Protein: 7.7 g/dL (ref 6.0–8.3)

## 2022-04-29 LAB — TSH: TSH: 5.28 u[IU]/mL (ref 0.35–5.50)

## 2022-04-29 MED ORDER — OMEPRAZOLE 40 MG PO CPDR: 40.0000 mg | DELAYED_RELEASE_CAPSULE | Freq: Every day | ORAL | 1 refills | Status: DC

## 2022-04-29 NOTE — Progress Notes (Signed)
Agree with assessment and plan as outlined.  

## 2022-04-29 NOTE — Patient Instructions (Signed)
Your provider has requested that you go to the basement level for lab work before leaving today. Press "B" on the elevator. The lab is located at the first door on the left as you exit the elevator.   We have sent the following medications to your pharmacy for you to pick up at your convenience:  Omeprazole 40 mg  You will be contacted by [place] in the next 2 days to arrange a CT Abdomen/Pelvis.  The number on your caller ID will be [phone], please answer when they call.  If you have not heard from them in 2 days please call [phone] to schedule.     You have been scheduled for a CT scan of the abdomen and pelvis at [facility], 1st floor Radiology. You are scheduled on ______  at _______. You should arrive 15 minutes prior to your appointment time for registration.  Please pick up 2 bottles of contrast from [facility] at least 3 days prior to your scan. The solution may taste better if refrigerated, but do NOT add ice or any other liquid to this solution. Shake well before drinking.   Please follow the written instructions below on the day of your exam:   1) Do not eat anything after ________ (4 hours prior to your test)   2) Drink 1 bottle of contrast @ __________ (2 hours prior to your exam)  Remember to shake well before drinking and do NOT pour over ice.     Drink 1 bottle of contrast @ _______ (1 hour prior to your exam)   You may take any medications as prescribed with a small amount of water, if necessary. If you take any of the following medications: METFORMIN, GLUCOPHAGE, GLUCOVANCE, AVANDAMET, RIOMET, FORTAMET, [place] MET, JANUMET, GLUMETZA or METAGLIP, you MAY be asked to HOLD this medication 48 hours AFTER the exam.   The purpose of you drinking the oral contrast is to aid in the visualization of your intestinal tract. The contrast solution may cause some diarrhea. Depending on your individual set of symptoms, you may also receive an  intravenous injection of x-ray contrast/dye. Plan on being at [facility] for 45 minutes or longer, depending on the type of exam you are having performed.   If you have any questions regarding your exam or if you need to reschedule, you may call [facility] at [phone] between the hours of 8:00 am and 5:00 pm, Monday-Friday.     Due to recent changes in healthcare laws, you may see the results of your imaging and laboratory studies on MyChart before your provider has had a chance to review them.  We understand that in some cases there may be results that are confusing or concerning to you. Not all laboratory results come back in the same time frame and the provider may be waiting for multiple results in order to interpret others.  Please give [place] 48 hours in order for your provider to thoroughly review all the results before contacting the office for clarification of your results.    If you are age 52 or older, your body mass index should be between 23-30. Your Body mass index is 21.22 kg/m. If this is out of the aforementioned range listed, please consider follow up with your Primary Care Provider.  If you are age 34 or younger, your body mass index should be between 19-25. Your Body mass index is 21.22 kg/m. If this is out of the aformentioned range  listed, please consider follow up with your Primary Care Provider.   ________________________________________________________  The [facility] providers would like to encourage you to use [facility] to communicate with providers for non-urgent requests or questions.  Due to long hold times on the telephone, sending your provider a message by [facility] may be a faster and more efficient way to get a response.  Please allow 48 business hours for a response.  Please remember that this is for non-urgent requests.  _______________________________________________________   I appreciate the  opportunity to care for you  Thank You   Carl Best NP

## 2022-05-06 ENCOUNTER — Ambulatory Visit (HOSPITAL_COMMUNITY)
Admission: RE | Admit: 2022-05-06 | Discharge: 2022-05-06 | Disposition: A | Discharge status: Home / Self Care | Payer: 59 | Source: Ambulatory Visit | Attending: Nurse Practitioner | Admitting: Nurse Practitioner

## 2022-05-06 DIAGNOSIS — R1013 Epigastric pain: Secondary | ICD-10-CM | POA: Diagnosis present

## 2022-05-06 DIAGNOSIS — K5900 Constipation, unspecified: Secondary | ICD-10-CM | POA: Diagnosis present

## 2022-05-06 DIAGNOSIS — R61 Generalized hyperhidrosis: Secondary | ICD-10-CM | POA: Insufficient documentation

## 2022-05-06 DIAGNOSIS — R634 Abnormal weight loss: Secondary | ICD-10-CM | POA: Diagnosis present

## 2022-05-06 MED ORDER — SODIUM CHLORIDE (PF) 0.9 % IJ SOLN
INTRAMUSCULAR | Status: AC
  Filled 2022-05-06: qty 50

## 2022-05-06 MED ORDER — IOHEXOL 300 MG/ML  SOLN
100.0000 mL | Freq: Once | INTRAMUSCULAR | Status: AC | PRN
  Administered 2022-05-06: 100 mL via INTRAVENOUS

## 2022-05-20 ENCOUNTER — Other Ambulatory Visit: Payer: Self-pay

## 2022-05-20 DIAGNOSIS — R1013 Epigastric pain: Secondary | ICD-10-CM

## 2022-05-20 DIAGNOSIS — R634 Abnormal weight loss: Secondary | ICD-10-CM

## 2022-05-29 ENCOUNTER — Encounter: Payer: Self-pay | Admitting: Certified Registered Nurse Anesthetist

## 2022-06-04 ENCOUNTER — Ambulatory Visit (AMBULATORY_SURGERY_CENTER): Payer: 59 | Admitting: Gastroenterology

## 2022-06-04 ENCOUNTER — Encounter: Payer: Self-pay | Admitting: Gastroenterology

## 2022-06-04 VITALS — BP 137/105 | HR 69 | Temp 99.3°F | Resp 9 | Ht 62.0 in | Wt 116.0 lb

## 2022-06-04 DIAGNOSIS — R1013 Epigastric pain: Secondary | ICD-10-CM

## 2022-06-04 DIAGNOSIS — K449 Diaphragmatic hernia without obstruction or gangrene: Secondary | ICD-10-CM

## 2022-06-04 DIAGNOSIS — K31819 Angiodysplasia of stomach and duodenum without bleeding: Secondary | ICD-10-CM

## 2022-06-04 MED ORDER — SODIUM CHLORIDE 0.9 % IV SOLN: 500.0000 mL | Freq: Once | INTRAVENOUS | Status: DC

## 2022-06-04 NOTE — Op Note (Signed)
[facility] Patient Name: Sarah Morris Procedure Date: 06/04/2022 3:57 PM MRN: [id number] Endoscopist: Remo Lipps P. Havery [name] , MD Age: 41 Referring MD:  Date of Birth: 1981/01/29 Gender: Female Account #: [id number] Procedure:                Upper GI endoscopy Indications:              Epigastric abdominal pain - history of                            constipation, treated without improvement. CT                            negative. Some prandial association. Prescribed                            omeprazole but has not yet tried it Medicines:                Monitored Anesthesia Care Procedure:                Pre-Anesthesia Assessment:                           - Prior to the procedure, a History and Physical                            was performed, and patient medications and                            allergies were reviewed. The patient's tolerance of                            previous anesthesia was also reviewed. The risks                            and benefits of the procedure and the sedation                            options and risks were discussed with the patient.                            All questions were answered, and informed consent                            was obtained. Prior Anticoagulants: The patient has                            taken no previous anticoagulant or antiplatelet                            agents. ASA Grade Assessment: II - A patient with                            mild systemic disease. After reviewing the risks  and benefits, the patient was deemed in                            satisfactory condition to undergo the procedure.                           After obtaining informed consent, the endoscope was                            passed under direct vision. Throughout the                            procedure, the patient's blood pressure, pulse, and                            oxygen saturations were  monitored continuously. The                            Endoscope was introduced through the mouth, and                            advanced to the second part of duodenum. The upper                            GI endoscopy was accomplished without difficulty.                            The patient tolerated the procedure well. Scope In: Scope Out: Findings:                 Esophagogastric landmarks were identified: the                            Z-line was found at 38 cm, the gastroesophageal                            junction was found at 38 cm and the upper extent of                            the gastric folds was found at 39 cm from the                            incisors.                           A 1 cm hiatal hernia was present.                           The exam of the esophagus was otherwise normal.                           There was some residual secretions in the stomach  which was lavaged / suctioned. There was a                            diminutive AVM in the gastric antrum. The entire                            examined stomach was otherwise normal. Biopsies                            were taken with a cold forceps for Helicobacter                            pylori testing.                           The examined duodenum was normal. Complications:            No immediate complications. Estimated blood loss:                            Minimal. Estimated Blood Loss:     Estimated blood loss was minimal. Impression:               - Esophagogastric landmarks identified.                           - 1 cm hiatal hernia.                           - Normal esophagus.                           - Small gastric AVM                           - Normal stomach otherwise. Biopsied.                           - Normal examined duodenum.                           No obvious cause for symptoms on this exam.                            Biopsies taken to rule out H  pylori. Recommend                            trial of omeprazole for 4 weeks to treat any                            component of dyspepsia. If symptoms persist                            consider RUQ [place] to rule out biliary colic /  gallstones. Recommendation:           - Patient has a contact number available for                            emergencies. The signs and symptoms of potential                            delayed complications were discussed with the                            patient. Return to normal activities tomorrow.                            Written discharge instructions were provided to the                            patient.                           - Resume previous diet.                           - Continue present medications.                           - Trial of omeprazole '40mg'$  / day as previously                            recommended for 1 month                           - Await pathology results and course on omeprazole                            as outlined Remo Lipps P. Arjun Hard, MD 06/04/2022 4:16:25 PM This report has been signed electronically.

## 2022-06-04 NOTE — Progress Notes (Signed)
1558 Robinul 0.1 mg IV given due large amount of secretions upon assessment.  MD made aware, vss

## 2022-06-04 NOTE — Progress Notes (Signed)
Report given to PACU, vss 

## 2022-06-04 NOTE — Progress Notes (Signed)
Pt's states no medical or surgical changes since previsit or office visit. 

## 2022-06-04 NOTE — Progress Notes (Signed)
[facility] History and Physical   Primary Care Physician:  [facility]   Reason for Procedure:   Epigastric pain  Plan:    EGD     HPI: Sarah Morris is a 41 y.o. female  here for EGD to evaluate epigastric pain. Patient has this ongoing for a few months. CT negative for clear cause. She has had some constipation treated without benefit. Does use NSAIDs periodically. Prescribed omeprazole but has not yet started taking it. Otherwise feels at baseline without any cardiopulmonary symptoms. I have discussed risks / benefits of colonoscopy and anesthesia and she wishes to proceed.   Past Medical History:  Diagnosis Date   Alcoholism [facility])    Ectopic pregnancy     Past Surgical History:  Procedure Laterality Date   ABDOMINAL HYSTERECTOMY     DILATION AND CURETTAGE OF UTERUS      Prior to Admission medications   Medication Sig Start Date End Date Taking? Authorizing Provider  norethindrone (MICRONOR) 0.35 MG tablet Take 1 tablet by mouth daily. 05/16/22  Yes [provider]  diclofenac Sodium (VOLTAREN) 1 % GEL Apply 4 g topically 4 (four) times daily. Apply to affected areas 4 times daily as needed for pain. Patient not taking: Reported on 04/29/2022 03/13/22   Lynden Oxford Scales, PA-C  ibuprofen (ADVIL) 600 MG tablet Take 1 tablet (600 mg total) by mouth every 6 (six) hours as needed for up to 21 doses for fever, headache, mild pain or moderate pain. Patient not taking: Reported on 04/29/2022 04/09/22   Talbot Grumbling, FNP  linaclotide [facility]) 145 MCG CAPS capsule Take 1 capsule (145 mcg total) by mouth daily before breakfast. Patient not taking: Reported on 04/29/2022 03/13/22   Lynden Oxford Scales, PA-C  metroNIDAZOLE (FLAGYL) 500 MG tablet Take 1 tablet (500 mg total) by mouth 2 (two) times daily. Patient not taking: Reported on 04/29/2022 03/14/22   Chase Picket, MD  ofloxacin (FLOXIN) 0.3 % OTIC solution Place 5  drops into the right ear 2 (two) times daily. Patient not taking: Reported on 04/29/2022 04/09/22   Talbot Grumbling, FNP  omeprazole (PRILOSEC) 40 MG capsule Take 1 capsule (40 mg total) by mouth daily. 04/29/22   Noralyn Pick, NP  Plecanatide (TRULANCE) 3 MG TABS Take 1 tablet by mouth daily before breakfast. Patient not taking: Reported on 04/29/2022 03/14/22   Lynden Oxford Scales, PA-C    Current Outpatient Medications  Medication Sig Dispense Refill   norethindrone (MICRONOR) 0.35 MG tablet Take 1 tablet by mouth daily.     diclofenac Sodium (VOLTAREN) 1 % GEL Apply 4 g topically 4 (four) times daily. Apply to affected areas 4 times daily as needed for pain. (Patient not taking: Reported on 04/29/2022) 100 g 2   ibuprofen (ADVIL) 600 MG tablet Take 1 tablet (600 mg total) by mouth every 6 (six) hours as needed for up to 21 doses for fever, headache, mild pain or moderate pain. (Patient not taking: Reported on 04/29/2022) 21 tablet 0   linaclotide (LINZESS) 145 MCG CAPS capsule Take 1 capsule (145 mcg total) by mouth daily before breakfast. (Patient not taking: Reported on 04/29/2022) 30 capsule 1   metroNIDAZOLE (FLAGYL) 500 MG tablet Take 1 tablet (500 mg total) by mouth 2 (two) times daily. (Patient not taking: Reported on 04/29/2022) 14 tablet 0   ofloxacin (FLOXIN) 0.3 % OTIC solution Place 5 drops into the right ear 2 (two) times daily. (Patient not taking:  Reported on 04/29/2022) 5 mL 0   omeprazole (PRILOSEC) 40 MG capsule Take 1 capsule (40 mg total) by mouth daily. 30 capsule 1   Plecanatide (TRULANCE) 3 MG TABS Take 1 tablet by mouth daily before breakfast. (Patient not taking: Reported on 04/29/2022) 30 tablet 0   Current Facility-Administered Medications  Medication Dose Route Frequency Provider Last Rate Last Admin   0.9 %  sodium chloride infusion  500 mL Intravenous Once Dreyton Roessner, Carlota Raspberry, MD        Allergies as of 06/04/2022 - Review Complete 06/04/2022   Allergen Reaction Noted   Black pepper-turmeric Anaphylaxis 05/30/2021   Food Swelling 11/28/2011   Bacitracin-polymyxin b Rash 05/30/2021   Neosporin [neomycin-bacitracin zn-polymyx] Rash 11/28/2011    Family History  Problem Relation Age of Onset   Hypertension Mother    Cancer Father        unknown   Diabetes Maternal Grandmother    Lung cancer Maternal Grandfather    Diabetes Cousin    Stomach cancer Neg Hx    Esophageal cancer Neg Hx    Colon cancer Neg Hx     Social History   Socioeconomic History   Marital status: Single    Spouse name: Not on file   Number of children: Not on file   Years of education: Not on file   Highest education level: Not on file  Occupational History   Occupation: handler  Tobacco Use   Smoking status: Every Day    Types: Cigarettes   Smokeless tobacco: Never  Vaping Use   Vaping Use: Never used  Substance and Sexual Activity   Alcohol use: Yes    Comment: occassionally   Drug use: Yes    Types: Marijuana   Sexual activity: Not on file  Other Topics Concern   Not on file  Social History Narrative   Not on file   Social Determinants of Health   Financial Resource Strain: Not on file  Food Insecurity: Not on file  Transportation Needs: Not on file  Physical Activity: Not on file  Stress: Not on file  Social Connections: Not on file  Intimate Partner Violence: Not on file    Review of Systems: All other review of systems negative except as mentioned in the HPI.  Physical Exam: Vital signs BP 106/78   Pulse (!) 109   Temp 99.3 F (37.4 C)   Ht '5\' 2"'$  (1.575 m)   Wt 116 lb (52.6 kg)   SpO2 100%   BMI 21.22 kg/m   General:   Alert,  Well-developed, pleasant and cooperative in NAD Lungs:  Clear throughout to auscultation.   Heart:  Regular rate and rhythm Abdomen:  Soft, nontender and nondistended.   Neuro/Psych:  Alert and cooperative. Normal mood and affect. A and O x 3  Jolly Mango, MD [facility]  Gastroenterology

## 2022-06-04 NOTE — Patient Instructions (Addendum)
Information on hiatal hernia given to you today.  Resume previous diet and medications.  Await pathology results from the biopsies taken today.  Take the omeprazole once a day.   YOU HAD AN ENDOSCOPIC PROCEDURE TODAY AT [facility]:   Refer to the procedure report that was given to you for any specific questions about what was found during the examination.  If the procedure report does not answer your questions, please call your gastroenterologist to clarify.  If you requested that your care partner not be given the details of your procedure findings, then the procedure report has been included in a sealed envelope for you to review at your convenience later.  YOU SHOULD EXPECT: Some feelings of bloating in the abdomen. Passage of more gas than usual.  Walking can help get rid of the air that was put into your GI tract during the procedure and reduce the bloating. If you had a lower endoscopy (such as a colonoscopy or flexible sigmoidoscopy) you may notice spotting of blood in your stool or on the toilet paper. If you underwent a bowel prep for your procedure, you may not have a normal bowel movement for a few days.  Please Note:  You might notice some irritation and congestion in your nose or some drainage.  This is from the oxygen used during your procedure.  There is no need for concern and it should clear up in a day or so.  SYMPTOMS TO REPORT IMMEDIATELY:  Following upper endoscopy (EGD)  Vomiting of blood or coffee ground material  New chest pain or pain under the shoulder blades  Painful or persistently difficult swallowing  New shortness of breath  Fever of 100F or higher  Black, tarry-looking stools  For urgent or emergent issues, a gastroenterologist can be reached at any hour by calling [phone]. Do not use MyChart messaging for urgent concerns.    DIET:  We do recommend a small meal at first, but then you may proceed to your regular diet.  Drink plenty  of fluids but you should avoid alcoholic beverages for 24 hours.  ACTIVITY:  You should plan to take it easy for the rest of today and you should NOT DRIVE or use heavy machinery until tomorrow (because of the sedation medicines used during the test).    FOLLOW UP: Our staff will call the number listed on your records the next business day following your procedure.  We will call around 7:15- 8:00 am to check on you and address any questions or concerns that you may have regarding the information given to you following your procedure. If we do not reach you, we will leave a message.  If you develop any symptoms (ie: fever, flu-like symptoms, shortness of breath, cough etc.) before then, please call [phone].  If you test positive for Covid 19 in the 2 weeks post procedure, please call and report this information to [place].    If any biopsies were taken you will be contacted by phone or by letter within the next 1-3 weeks.  Please call us at [phone] if you have not heard about the biopsies in 3 weeks.    SIGNATURES/CONFIDENTIALITY: You and/or your care partner have signed paperwork which will be entered into your electronic medical record.  These signatures attest to the fact that that the information above on your After Visit Summary has been reviewed and is understood.  Full responsibility of the confidentiality of this discharge information lies with you  and/or your care-partner.

## 2022-06-04 NOTE — Progress Notes (Signed)
Called to room to assist during endoscopic procedure.  Patient ID and intended procedure confirmed with present staff. Received instructions for my participation in the procedure from the performing physician.  

## 2022-06-05 ENCOUNTER — Telehealth: Payer: Self-pay

## 2022-06-05 ENCOUNTER — Ambulatory Visit: Payer: 59 | Admitting: Family Medicine

## 2022-06-05 NOTE — Telephone Encounter (Signed)
Left message on follow up call. 

## 2022-06-10 ENCOUNTER — Other Ambulatory Visit (HOSPITAL_COMMUNITY)
Admission: RE | Admit: 2022-06-10 | Discharge: 2022-06-10 | Disposition: A | Discharge status: Home / Self Care | Payer: 59 | Source: Ambulatory Visit | Attending: Obstetrics and Gynecology | Admitting: Obstetrics and Gynecology

## 2022-06-10 ENCOUNTER — Other Ambulatory Visit: Payer: Self-pay | Admitting: Obstetrics and Gynecology

## 2022-06-10 DIAGNOSIS — Z01419 Encounter for gynecological examination (general) (routine) without abnormal findings: Secondary | ICD-10-CM | POA: Diagnosis present

## 2022-06-14 LAB — CYTOLOGY - PAP
Comment: NEGATIVE
Comment: NEGATIVE
Comment: NEGATIVE
Diagnosis: HIGH — AB
HPV 16: POSITIVE — AB
HPV 18 / 45: NEGATIVE
High risk HPV: POSITIVE — AB

## 2022-07-24 ENCOUNTER — Other Ambulatory Visit: Payer: Self-pay | Admitting: Obstetrics and Gynecology

## 2022-08-08 ENCOUNTER — Telehealth: Payer: Self-pay

## 2022-08-08 NOTE — Telephone Encounter (Signed)
Left message for patient to call back and setup new patient appointment .

## 2022-08-09 ENCOUNTER — Encounter: Payer: Self-pay | Admitting: Psychiatry

## 2022-08-09 NOTE — Telephone Encounter (Signed)
Spoke with Sarah Morris regarding her referral to GYN oncology. She has an appointment scheduled with Dr. Ernestina Patches on 08/12/22 at 11:15am. Patient agrees to date and time. She has been provided with office address and location. She is also aware of our mask and visitor policy. Patient verbalized understanding and will call with any questions.

## 2022-08-12 ENCOUNTER — Other Ambulatory Visit: Payer: Self-pay

## 2022-08-12 ENCOUNTER — Inpatient Hospital Stay: Payer: Commercial Managed Care - HMO | Attending: Psychiatry | Admitting: Psychiatry

## 2022-08-12 ENCOUNTER — Inpatient Hospital Stay (HOSPITAL_BASED_OUTPATIENT_CLINIC_OR_DEPARTMENT_OTHER): Payer: Commercial Managed Care - HMO | Admitting: Gynecologic Oncology

## 2022-08-12 ENCOUNTER — Encounter: Payer: Self-pay | Admitting: Psychiatry

## 2022-08-12 VITALS — BP 112/90 | HR 79 | Temp 98.7°F | Resp 16 | Wt 117.0 lb

## 2022-08-12 DIAGNOSIS — C539 Malignant neoplasm of cervix uteri, unspecified: Secondary | ICD-10-CM | POA: Diagnosis not present

## 2022-08-12 DIAGNOSIS — Z789 Other specified health status: Secondary | ICD-10-CM

## 2022-08-12 NOTE — H&P (View-Only) (Signed)
GYNECOLOGIC ONCOLOGY NEW PATIENT CONSULTATION  Date of Service: 08/12/2022 Referring Provider: Christophe Louis, MD [facility]  ASSESSMENT AND PLAN: Sarah Morris is a 41 y.o. 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  Confirmed with pathologist that based on biopsy, depth of stromal invasion 6.44m.  We reviewed the patient's diagnosis of early stage cervical cancer.  On today's examination, she is a clinical stage IB1 with a gross lesion estimated at 1cm. The patient was counseled extensively on the treatment options for an early cervical cancer which include radical hysterectomy with pelvic lymphadenectomy or chemoradiation.  The risks and benefits of both were reviewed.  I have emphasized that the cure rates for these two treatments are equivalent; however, I discussed that the toxicity profiles are different between these two modalities.    Risks of primary radiotherapy with sensitizing chemotherapy can include cystitis, proctitis, chronic rectal bleeding sigmoid stricture, small bowel obstruction, ureteral stricture, urinary or enteral fistula, loss of sexual function related to vaginal shortening and atrophy and menopause due to ovarian ablation.   Surgery is associated with risks of bleeding, blood transfusion, injury to the bowel, bladder, ureter, urinary fistula, vaginal dehiscence, prolonged or permanent bladder dysfunction potentially requiring self-catheterization, lymphedema, infection, thromboembolic events, and possible effects on sexual function. She understands that intraoperatively, the finding of previously unsuspected spread outside the cervix may lead to the radical hysterectomy being aborted.  Additionally, high or intermediate-risk factors on the final pathology report, such as positive lymph nodes, deep invasion, large tumor size and lymphatic invasion, will likely lead  to a recommendation of postoperative chemotherapy and radiation.   She desires to proceed with radical hysterectomy. Because the patient is premenopausal, we recommend leaving ovaries in, but bilateral fallopian tubes will be removed.  Postoperatively, the patient will have an indwelling foley catheter at the time of discharge. She also understands that additional therapy may be recommended based on the results of her final pathology.  Prior to surgery, a PET has been ordered to rule out any distant metastasis that would preclude the patient from being a surgical candidate.  Patient was consented for: radical abdominal hysterectomy, bilateral salpingectomy, bilateral pelvic lymphadenectomy on 09/03/22.  The risks of surgery were discussed in detail and she understands these to including but not limited to bleeding requiring a blood transfusion, infection, injury to adjacent organs (including but not limited to the bowels, bladder, ureters, nerves, blood vessels), thromboembolic events, wound separation, hernia, vaginal cuff separation, possible risk of lymphedema and lymphocyst if lymphadenectomy performed, unforseen complication, and medical complications such as heart attack, stroke, pneumonia.  If the patient experiences any of these events, she understands that her hospitalization or recovery may be prolonged and that she may need to take additional medications for a prolonged period. The patient will receive DVT and antibiotic prophylaxis as indicated. She voiced a clear understanding. She had the opportunity to ask questions and written informed consent was obtained today. She wishes to proceed.  Given patient's history of alcohol use, we will need to assess her liver function (CMP, coags). Once labs completed, will calculate MELD score She does not require preoperative clearance apart from evaluating labs above. Her METs are >4.   A copy of this note was sent to the patient's referring  provider.  MBernadene Bell MD Gynecologic Oncology   Medical Decision Making I personally spent  TOTAL 45 minutes face-to-face and non-face-to-face in the care  of this patient, which includes all pre, intra, and post visit time on the date of service.  5 minutes spent reviewing records prior to the visit 35 Minutes in patient contact 5 minutes charting , conferring with consultants etc.  ------------  CC: Cervical cancer  HISTORY OF PRESENT ILLNESS:  Sarah Morris is a 41 y.o. woman who is seen in consultation at the request of Christophe Louis, MD for evaluation of new diagnosis of cervical cancer.  Patient has a history of prior abnormal Pap smear on 05/11/2021 with negative cytology and high risk HPV, type XVI positive.  She underwent a colposcopy on 05/30/2021 with a benign ECC and CIN-1 on biopsy.  Repeat Pap smear in 06/10/2022 returned as H SIL, positive HPV 16.  Colposcopy on 07/24/2022 returned with an ECC with H SIL, and biopsy at 6 and 9:00 with invasive, moderately differentiated squamous cell carcinoma with perineural invasion present and suspicious for LVSI, positive at deep margin of biopsy, 6.2 mm.  Today, patient presents with her boyfriend.  She reports that she could tell something was not right with her body was not sure what.  She reports that she is ready to proceed with what ever treatment is recommended.  Of note patient reports she drinks approximately 25-30 beers a week.  She reports that this is done typically in social settings and does not feel that it negatively impacts her life. She denies requiring an eye opener.   PAST MEDICAL HISTORY: Past Medical History:  Diagnosis Date   Alcoholism [facility])    Ectopic pregnancy     PAST SURGICAL HISTORY: Past Surgical History:  Procedure Laterality Date   DILATION AND CURETTAGE OF UTERUS      OB/GYN HISTORY: OB History  Gravida Para Term Preterm AB Living  4       4    SAB IAB Ectopic Multiple Live Births  1   3         # Outcome Date GA Lbr Len/2nd Weight Sex Delivery Anes PTL Lv  4 SAB      SAB     3 Ectopic           2 Ectopic           1 Ectopic               Age at menarche: 73 Age at menopause: N/A Hx of HRT: None; on Depo for the last 2 years.  On OCPs for the last 3 months Hx of STI: BV, trichomonas, treated.  HIV last tested in 2016 and negative.  SCREENING STUDIES:  Last mammogram: 2021 Last colonoscopy: 2023  MEDICATIONS:  Current Outpatient Medications:    norethindrone (MICRONOR) 0.35 MG tablet, Take 1 tablet by mouth daily., Disp: , Rfl:   ALLERGIES: Allergies  Allergen Reactions   Black Pepper-Turmeric Anaphylaxis   Food Swelling    Black pepper   Bacitracin-Polymyxin B Rash   Neosporin [Neomycin-Bacitracin Zn-Polymyx] Rash    FAMILY HISTORY: Family History  Problem Relation Age of Onset   Hypertension Mother    Cancer Father        unknown   Diabetes Maternal Grandmother    Lung cancer Maternal Grandfather    Diabetes Cousin    Stomach cancer Neg Hx    Esophageal cancer Neg Hx    Colon cancer Neg Hx     SOCIAL HISTORY: Social History   Socioeconomic History   Marital status: Single    Spouse name: Not on  file   Number of children: Not on file   Years of education: Not on file   Highest education level: Not on file  Occupational History   Occupation: handler  Tobacco Use   Smoking status: Every Day    Packs/day: 0.50    Types: Cigarettes    Start date: 2008   Smokeless tobacco: Never  Vaping Use   Vaping Use: Never used  Substance and Sexual Activity   Alcohol use: Yes    Alcohol/week: 25.0 standard drinks of alcohol    Types: 25 Cans of beer per week    Comment: occassionally   Drug use: Yes    Types: Marijuana   Sexual activity: Yes  Other Topics Concern   Not on file  Social History Narrative   Not on file   Social Determinants of Health   Financial Resource Strain: Not on file  Food Insecurity: Not on file  Transportation  Needs: Not on file  Physical Activity: Not on file  Stress: Not on file  Social Connections: Not on file  Intimate Partner Violence: Not on file    REVIEW OF SYSTEMS: New patient intake form was reviewed.  Complete 10-system review is negative except for the following: Hearing loss, sweating at night  PHYSICAL EXAM: BP (!) 112/90 (BP Location: Left Arm, Patient Position: Sitting) Comment: informed Dr Ernestina Patches of BP  Pulse 79   Temp 98.7 F (37.1 C) (Oral)   Resp 16   Wt 117 lb (53.1 kg)   SpO2 100%   BMI 21.40 kg/m  Constitutional: No acute distress. Neuro/Psych: Alert, oriented.  Head and Neck: Normocephalic, atraumatic. Neck symmetric without masses. Sclera anicteric.  Respiratory: Normal work of breathing. Clear to auscultation bilaterally. Cardiovascular: Regular rate and rhythm, no murmurs, rubs, or gallops. Abdomen: Normoactive bowel sounds. Soft, non-distended, non-tender to palpation. No masses or hepatosplenomegaly appreciated. No evidence of hernia. No palpable fluid wave.  Extremities: Grossly normal range of motion. Warm, well perfused. No edema bilaterally. Skin: No rashes or lesions. Lymphatic: No cervical, supraclavicular, or inguinal adenopathy. Genitourinary: External genitalia without lesions. Urethral meatus without lesions or prolapse. On speculum exam, vagina without lesions.  Approximately 1 cm nodule of the posterior cervix with area of healing from biopsy and hypervascularity at this area. Bimanual exam reveals firm posterior cervix.  Otherwise mobile uterus and no adnexal masses or tenderness.  No palpable vaginal involvement. Rectovaginal exam confirms the above findings and reveals normal sphincter tone and no masses or nodularity.  Negative parametrial involvement. exam chaperoned by Joylene John, NP   LABORATORY AND RADIOLOGIC DATA: Outside medical records were reviewed to synthesize the above history, along with the history and physical obtained during  the visit.  Outside laboratory, pathology, and imaging reports were reviewed, with pertinent results below.  I personally reviewed the outside images.  WBC  Date Value Ref Range Status  04/29/2022 7.4 4.0 - 10.5 K/uL Final   Hemoglobin  Date Value Ref Range Status  04/29/2022 13.2 12.0 - 15.0 g/dL Final   HCT  Date Value Ref Range Status  04/29/2022 38.7 36.0 - 46.0 % Final   Platelets  Date Value Ref Range Status  04/29/2022 289.0 150.0 - 400.0 K/uL Final   Creatinine, Ser  Date Value Ref Range Status  04/29/2022 0.93 0.40 - 1.20 mg/dL Final   AST  Date Value Ref Range Status  04/29/2022 16 0 - 37 U/L Final   ALT  Date Value Ref Range Status  04/29/2022 11 0 -  35 U/L Final   Lab Results  Component Value Date   HIV Non Reactive 11/03/2015      CT Abdomen Pelvis W Contrast 05/06/2022  Narrative CLINICAL DATA:  Epigastric pain and weight loss  EXAM: CT ABDOMEN AND PELVIS WITH CONTRAST  TECHNIQUE: Multidetector CT imaging of the abdomen and pelvis was performed using the standard protocol following bolus administration of intravenous contrast.  RADIATION DOSE REDUCTION: This exam was performed according to the departmental dose-optimization program which includes automated exposure control, adjustment of the mA and/or kV according to patient size and/or use of iterative reconstruction technique.  CONTRAST:  164m OMNIPAQUE IOHEXOL 300 MG/ML  SOLN  COMPARISON:  None Available.  FINDINGS: Lower chest: No acute abnormality.  Hepatobiliary: Liver is normal in size and contour. Hypodensity anteriorly adjacent to the falciform ligament likely represents focal fatty infiltration. 2.1 cm hemangioma in the central right hepatic lobe. Subcentimeter hypodensity in segment 6 May represent a cyst or hemangioma. Gallbladder appears grossly normal. No biliary ductal dilatation identified.  Pancreas: Unremarkable. No pancreatic ductal dilatation or surrounding  inflammatory changes.  Spleen: Normal in size without focal abnormality.  Adrenals/Urinary Tract: Adrenal glands are unremarkable. Kidneys are normal, without renal calculi, focal lesion, or hydronephrosis. Bladder is unremarkable.  Stomach/Bowel: Stomach is within normal limits. Appendix appears normal. No evidence of bowel wall thickening, distention, or inflammatory changes.  Vascular/Lymphatic: Aortic atherosclerosis. No enlarged abdominal or pelvic lymph nodes.  Reproductive: Heterogeneous uterus with multiple fibroids. No suspicious adnexal mass identified.  Other: No ascites.  Musculoskeletal: No acute or significant osseous findings.  IMPRESSION: 1. No acute process identified. 2. Hepatic hemangioma and a subcentimeter hypodensity which may represent cyst or hemangioma. 3. Fibroid uterus.   Electronically Signed By: DOfilia NeasM.D. On: 05/07/2022 12:25

## 2022-08-12 NOTE — Patient Instructions (Addendum)
Preparing for your Surgery  Plan for surgery on September 03, 2022 with Dr. Bernadene Bell at [place] will be scheduled for abdominal radical hysterectomy (removal of the uterus, cervix, and surrounding tissues), bilateral salpingectomy (removal of both fallopian tubes) with pelvic lymph node dissection.   Pre-operative Testing -You will receive a phone call from presurgical testing at [facility] to arrange for a pre-operative appointment and lab work.  -Bring your insurance card, copy of an advanced directive if applicable, medication list  -At that visit, you will be asked to sign a consent for a possible blood transfusion in case a transfusion becomes necessary during surgery.  The need for a blood transfusion is rare but having consent is a necessary part of your care.     -You should not be taking blood thinners or aspirin at least ten days prior to surgery unless instructed by your surgeon.  -Do not take supplements such as fish oil (omega 3), red yeast rice, turmeric before your surgery. You want to avoid medications with aspirin in them including headache powders such as BC or Goody's), Excedrin migraine.  Day Before Surgery at [place] will be asked to take in a light diet the day before surgery. You will be advised you can have clear liquids up until 3 hours before your surgery.    Eat a light diet the day before surgery.  Examples including soups, broths, toast, yogurt, mashed potatoes.  AVOID GAS PRODUCING FOODS. Things to avoid include carbonated beverages (fizzy beverages, sodas), raw fruits and raw vegetables (uncooked), or beans.   If your bowels are filled with gas, your surgeon will have difficulty visualizing your pelvic organs which increases your surgical risks.  Your role in recovery Your role is to become active as soon as directed by your doctor, while still giving yourself time to heal.  Rest when you feel tired. You will be asked to do the  following in order to speed your recovery:  - Cough and breathe deeply. This helps to clear and expand your lungs and can prevent pneumonia after surgery.  - [place]. Do mild physical activity. Walking or moving your legs help your circulation and body functions return to normal. Do not try to get up or walk alone the first time after surgery.   -If you develop swelling on one leg or the other, pain in the back of your leg, redness/warmth in one of your legs, please call the office or go to the Emergency Room to have a doppler to rule out a blood clot. For shortness of breath, chest pain-seek care in the Emergency Room as soon as possible. - Actively manage your pain. Managing your pain lets you move in comfort. We will ask you to rate your pain on a scale of zero to 10. It is your responsibility to tell your doctor or nurse where and how much you hurt so your pain can be treated.  Special Considerations -If you are diabetic, you may be placed on insulin after surgery to have closer control over your blood sugars to promote healing and recovery.  This does not mean that you will be discharged on insulin.  If applicable, your oral antidiabetics will be resumed when you are tolerating a solid diet.  -Your final pathology results from surgery should be available around one week after surgery and the results will be relayed to you when available.  -Dr. Lahoma Crocker is the surgeon that  assists your GYN Oncologist with surgery.  If you end up staying the night, the next day after your surgery you will either see Dr. Ernestina Patches, Dr. Berline Lopes, or Dr. Lahoma Crocker.  -FMLA forms can be faxed to [phone] and please allow 5-7 business days for completion.  Pain Management After Surgery -You will be prescribed pain medication once you leave the hospital to take if you need it for severe pain.  -Make sure that you have Tylenol and Ibuprofen IF YOU ARE ABLE TO TAKE THESE  MEDICATIONS at home to use on a regular basis after surgery for pain control. We recommend alternating the medications every hour to six hours since they work differently and are processed in the body differently for pain relief.  -Review the attached handout on narcotic use and their risks and side effects.   Bowel Regimen -You will be prescribed Sennakot-S to take nightly to prevent constipation especially if you are taking the narcotic pain medication intermittently.  It is important to prevent constipation and drink adequate amounts of liquids. You can stop taking this medication when you are not taking pain medication and you are back on your normal bowel routine.  Risks of Surgery Risks of surgery are low but include bleeding, infection, damage to surrounding structures, re-operation, blood clots, and very rarely death.   Blood Transfusion Information (For the consent to be signed before surgery)  We will be checking your blood type before surgery so in case of emergencies, we will know what type of blood you would need.                                            WHAT IS A BLOOD TRANSFUSION?  A transfusion is the replacement of blood or some of its parts. Blood is made up of multiple cells which provide different functions. Red blood cells carry oxygen and are used for blood loss replacement. White blood cells fight against infection. Platelets control bleeding. Plasma helps clot blood. Other blood products are available for specialized needs, such as hemophilia or other clotting disorders. BEFORE THE TRANSFUSION  Who gives blood for transfusions?  You may be able to donate blood to be used at a later date on yourself (autologous donation). Relatives can be asked to donate blood. This is generally not any safer than if you have received blood from a stranger. The same precautions are taken to ensure safety when a relative's blood is donated. Healthy volunteers who are fully evaluated  to make sure their blood is safe. This is blood bank blood. Transfusion therapy is the safest it has ever been in the practice of medicine. Before blood is taken from a donor, a complete history is taken to make sure that person has no history of diseases nor engages in risky social behavior (examples are intravenous drug use or sexual activity with multiple partners). The donor's travel history is screened to minimize risk of transmitting infections, such as malaria. The donated blood is tested for signs of infectious diseases, such as HIV and hepatitis. The blood is then tested to be sure it is compatible with you in order to minimize the chance of a transfusion reaction. If you or a relative donates blood, this is often done in anticipation of surgery and is not appropriate for emergency situations. It takes many days to process the donated blood. RISKS AND COMPLICATIONS Although  transfusion therapy is very safe and saves many lives, the main dangers of transfusion include:  Getting an infectious disease. Developing a transfusion reaction. This is an allergic reaction to something in the blood you were given. Every precaution is taken to prevent this. The decision to have a blood transfusion has been considered carefully by your caregiver before blood is given. Blood is not given unless the benefits outweigh the risks.  AFTER SURGERY INSTRUCTIONS  Return to work: 4-6 weeks if applicable  You will have a white honeycomb dressing over your larger incision. This dressing can be removed 5 days after surgery and you do not need to reapply a new dressing. Once you remove the dressing, you will notice that you have the surgical glue (dermabond) on the incision and this will peel off on its own. You can get this dressing wet in the shower the days after surgery prior to removal on the 5th day.   Activity: 1. Be up and out of the bed during the day.  Take a nap if needed.  You may walk up steps but be  careful and use the hand rail.  Stair climbing will tire you more than you think, you may need to stop part way and rest.   2. No lifting or straining for 6 weeks over 10 pounds. No pushing, pulling, straining for 6 weeks.  3. No driving for 2 week(s).  Do not drive if you are taking narcotic pain medicine and make sure that your reaction time has returned.   4. You can shower as soon as the next day after surgery. Shower daily.  Use your regular soap and water (not directly on the incision) and pat your incision(s) dry afterwards; don't rub.  No tub baths or submerging your body in water until cleared by your surgeon. If you have the soap that was given to you by pre-surgical testing that was used before surgery, you do not need to use it afterwards because this can irritate your incisions.   5. No sexual activity and nothing in the vagina for 10 weeks.  6. You may experience a small amount of clear drainage from your incision, which is normal.  If the drainage persists, increases, or changes color please call the office.  7. Do not use creams, lotions, or ointments such as neosporin on your incisions after surgery until advised by your surgeon because they can cause removal of the dermabond glue on your incisions.    8. You may experience vaginal spotting after surgery or around the 6-8 week mark from surgery when the stitches at the top of the vagina begin to dissolve.  The spotting is normal but if you experience heavy bleeding, call our office.  9. Take Tylenol or ibuprofen first for pain if you are able to take these medications and only use narcotic pain medication for severe pain not relieved by the Tylenol or Ibuprofen.  Monitor your Tylenol intake to a max of 4,000 mg in a 24 hour period. You can alternate these medications after surgery.  Diet: 1. Low sodium Heart Healthy Diet is recommended but you are cleared to resume your normal (before surgery) diet after your procedure.  2. It  is safe to use a laxative, such as Miralax or Colace, if you have difficulty moving your bowels. You will be prescribed Sennakot-S to take at bedtime every evening after surgery to keep bowel movements regular and to prevent constipation.    Wound Care: 1. Keep clean  and dry.  Shower daily.  Reasons to call the Doctor: Fever - Oral temperature greater than 100.4 degrees Fahrenheit Foul-smelling vaginal discharge Difficulty urinating Nausea and vomiting Increased pain at the site of the incision that is unrelieved with pain medicine. Difficulty breathing with or without chest pain New calf pain especially if only on one side Sudden, continuing increased vaginal bleeding with or without clots.   Contacts: For questions or concerns you should contact:  Dr. Bernadene Bell at [place], NP at [phone]  After Hours: call [phone] and have the GYN Oncologist paged/contacted (after 5 pm or on the weekends).  Messages sent via mychart are for non-urgent matters and are not responded to after hours so for urgent needs, please call the after hours number.

## 2022-08-12 NOTE — Progress Notes (Unsigned)
GYNECOLOGIC ONCOLOGY NEW PATIENT CONSULTATION  Date of Service: 08/12/2022 Referring Provider: Christophe Louis, MD [facility]  ASSESSMENT AND PLAN: Sarah Morris is a 41 y.o. 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  Confirmed with pathologist that based on biopsy, depth of stromal invasion 6.46m.  We reviewed the patient's diagnosis of early stage cervical cancer.  On today's examination, she is a clinical stage IB1 with a gross lesion estimated at 1cm. The patient was counseled extensively on the treatment options for an early cervical cancer which include radical hysterectomy with pelvic lymphadenectomy or chemoradiation.  The risks and benefits of both were reviewed.  I have emphasized that the cure rates for these two treatments are equivalent; however, I discussed that the toxicity profiles are different between these two modalities.    Risks of primary radiotherapy with sensitizing chemotherapy can include cystitis, proctitis, chronic rectal bleeding sigmoid stricture, small bowel obstruction, ureteral stricture, urinary or enteral fistula, loss of sexual function related to vaginal shortening and atrophy and menopause due to ovarian ablation.   Surgery is associated with risks of bleeding, blood transfusion, injury to the bowel, bladder, ureter, urinary fistula, vaginal dehiscence, prolonged or permanent bladder dysfunction potentially requiring self-catheterization, lymphedema, infection, thromboembolic events, and possible effects on sexual function. She understands that intraoperatively, the finding of previously unsuspected spread outside the cervix may lead to the radical hysterectomy being aborted.  Additionally, high or intermediate-risk factors on the final pathology report, such as positive lymph nodes, deep invasion, large tumor size and lymphatic invasion, will likely lead  to a recommendation of postoperative chemotherapy and radiation.   She desires to proceed with radical hysterectomy. Because the patient is premenopausal, we recommend leaving ovaries in, but bilateral fallopian tubes will be removed.  Postoperatively, the patient will have an indwelling foley catheter at the time of discharge. She also understands that additional therapy may be recommended based on the results of her final pathology.  Prior to surgery, a PET has been ordered to rule out any distant metastasis that would preclude the patient from being a surgical candidate.  Patient was consented for: radical abdominal hysterectomy, bilateral salpingectomy, bilateral pelvic lymphadenectomy on 09/03/22.  The risks of surgery were discussed in detail and she understands these to including but not limited to bleeding requiring a blood transfusion, infection, injury to adjacent organs (including but not limited to the bowels, bladder, ureters, nerves, blood vessels), thromboembolic events, wound separation, hernia, vaginal cuff separation, possible risk of lymphedema and lymphocyst if lymphadenectomy performed, unforseen complication, and medical complications such as heart attack, stroke, pneumonia.  If the patient experiences any of these events, she understands that her hospitalization or recovery may be prolonged and that she may need to take additional medications for a prolonged period. The patient will receive DVT and antibiotic prophylaxis as indicated. She voiced a clear understanding. She had the opportunity to ask questions and written informed consent was obtained today. She wishes to proceed.  Given patient's history of alcohol use, we will need to assess her liver function (CMP, coags). Once labs completed, will calculate MELD score She does not require preoperative clearance apart from evaluating labs above. Her METs are >4.   A copy of this note was sent to the patient's referring  provider.  MBernadene Bell MD Gynecologic Oncology   Medical Decision Making I personally spent  TOTAL 45 minutes face-to-face and non-face-to-face in the care  of this patient, which includes all pre, intra, and post visit time on the date of service.  5 minutes spent reviewing records prior to the visit 35 Minutes in patient contact 5 minutes charting , conferring with consultants etc.  ------------  CC: Cervical cancer  HISTORY OF PRESENT ILLNESS:  Sarah Morris is a 41 y.o. woman who is seen in consultation at the request of Christophe Louis, MD for evaluation of new diagnosis of cervical cancer.  Patient has a history of prior abnormal Pap smear on 05/11/2021 with negative cytology and high risk HPV, type XVI positive.  She underwent a colposcopy on 05/30/2021 with a benign ECC and CIN-1 on biopsy.  Repeat Pap smear in 06/10/2022 returned as H SIL, positive HPV 16.  Colposcopy on 07/24/2022 returned with an ECC with H SIL, and biopsy at 6 and 9:00 with invasive, moderately differentiated squamous cell carcinoma with perineural invasion present and suspicious for LVSI, positive at deep margin of biopsy, 6.2 mm.  Today, patient presents with her boyfriend.  She reports that she could tell something was not right with her body was not sure what.  She reports that she is ready to proceed with what ever treatment is recommended.  Of note patient reports she drinks approximately 25-30 beers a week.  She reports that this is done typically in social settings and does not feel that it negatively impacts her life. She denies requiring an eye opener.   PAST MEDICAL HISTORY: Past Medical History:  Diagnosis Date   Alcoholism [facility])    Ectopic pregnancy     PAST SURGICAL HISTORY: Past Surgical History:  Procedure Laterality Date   DILATION AND CURETTAGE OF UTERUS      OB/GYN HISTORY: OB History  Gravida Para Term Preterm AB Living  4       4    SAB IAB Ectopic Multiple Live Births  1   3         # Outcome Date GA Lbr Len/2nd Weight Sex Delivery Anes PTL Lv  4 SAB      SAB     3 Ectopic           2 Ectopic           1 Ectopic               Age at menarche: 67 Age at menopause: N/A Hx of HRT: None; on Depo for the last 2 years.  On OCPs for the last 3 months Hx of STI: BV, trichomonas, treated.  HIV last tested in 2016 and negative.  SCREENING STUDIES:  Last mammogram: 2021 Last colonoscopy: 2023  MEDICATIONS:  Current Outpatient Medications:    norethindrone (MICRONOR) 0.35 MG tablet, Take 1 tablet by mouth daily., Disp: , Rfl:   ALLERGIES: Allergies  Allergen Reactions   Black Pepper-Turmeric Anaphylaxis   Food Swelling    Black pepper   Bacitracin-Polymyxin B Rash   Neosporin [Neomycin-Bacitracin Zn-Polymyx] Rash    FAMILY HISTORY: Family History  Problem Relation Age of Onset   Hypertension Mother    Cancer Father        unknown   Diabetes Maternal Grandmother    Lung cancer Maternal Grandfather    Diabetes Cousin    Stomach cancer Neg Hx    Esophageal cancer Neg Hx    Colon cancer Neg Hx     SOCIAL HISTORY: Social History   Socioeconomic History   Marital status: Single    Spouse name: Not on  file   Number of children: Not on file   Years of education: Not on file   Highest education level: Not on file  Occupational History   Occupation: handler  Tobacco Use   Smoking status: Every Day    Packs/day: 0.50    Types: Cigarettes    Start date: 2008   Smokeless tobacco: Never  Vaping Use   Vaping Use: Never used  Substance and Sexual Activity   Alcohol use: Yes    Alcohol/week: 25.0 standard drinks of alcohol    Types: 25 Cans of beer per week    Comment: occassionally   Drug use: Yes    Types: Marijuana   Sexual activity: Yes  Other Topics Concern   Not on file  Social History Narrative   Not on file   Social Determinants of Health   Financial Resource Strain: Not on file  Food Insecurity: Not on file  Transportation  Needs: Not on file  Physical Activity: Not on file  Stress: Not on file  Social Connections: Not on file  Intimate Partner Violence: Not on file    REVIEW OF SYSTEMS: New patient intake form was reviewed.  Complete 10-system review is negative except for the following: Hearing loss, sweating at night  PHYSICAL EXAM: BP (!) 112/90 (BP Location: Left Arm, Patient Position: Sitting) Comment: informed Dr Ernestina Patches of BP  Pulse 79   Temp 98.7 F (37.1 C) (Oral)   Resp 16   Wt 117 lb (53.1 kg)   SpO2 100%   BMI 21.40 kg/m  Constitutional: No acute distress. Neuro/Psych: Alert, oriented.  Head and Neck: Normocephalic, atraumatic. Neck symmetric without masses. Sclera anicteric.  Respiratory: Normal work of breathing. Clear to auscultation bilaterally. Cardiovascular: Regular rate and rhythm, no murmurs, rubs, or gallops. Abdomen: Normoactive bowel sounds. Soft, non-distended, non-tender to palpation. No masses or hepatosplenomegaly appreciated. No evidence of hernia. No palpable fluid wave.  Extremities: Grossly normal range of motion. Warm, well perfused. No edema bilaterally. Skin: No rashes or lesions. Lymphatic: No cervical, supraclavicular, or inguinal adenopathy. Genitourinary: External genitalia without lesions. Urethral meatus without lesions or prolapse. On speculum exam, vagina without lesions.  Approximately 1 cm nodule of the posterior cervix with area of healing from biopsy and hypervascularity at this area. Bimanual exam reveals firm posterior cervix.  Otherwise mobile uterus and no adnexal masses or tenderness.  No palpable vaginal involvement. Rectovaginal exam confirms the above findings and reveals normal sphincter tone and no masses or nodularity.  Negative parametrial involvement. exam chaperoned by Joylene John, NP   LABORATORY AND RADIOLOGIC DATA: Outside medical records were reviewed to synthesize the above history, along with the history and physical obtained during  the visit.  Outside laboratory, pathology, and imaging reports were reviewed, with pertinent results below.  I personally reviewed the outside images.  WBC  Date Value Ref Range Status  04/29/2022 7.4 4.0 - 10.5 K/uL Final   Hemoglobin  Date Value Ref Range Status  04/29/2022 13.2 12.0 - 15.0 g/dL Final   HCT  Date Value Ref Range Status  04/29/2022 38.7 36.0 - 46.0 % Final   Platelets  Date Value Ref Range Status  04/29/2022 289.0 150.0 - 400.0 K/uL Final   Creatinine, Ser  Date Value Ref Range Status  04/29/2022 0.93 0.40 - 1.20 mg/dL Final   AST  Date Value Ref Range Status  04/29/2022 16 0 - 37 U/L Final   ALT  Date Value Ref Range Status  04/29/2022 11 0 -  35 U/L Final   Lab Results  Component Value Date   HIV Non Reactive 11/03/2015      CT Abdomen Pelvis W Contrast 05/06/2022  Narrative CLINICAL DATA:  Epigastric pain and weight loss  EXAM: CT ABDOMEN AND PELVIS WITH CONTRAST  TECHNIQUE: Multidetector CT imaging of the abdomen and pelvis was performed using the standard protocol following bolus administration of intravenous contrast.  RADIATION DOSE REDUCTION: This exam was performed according to the departmental dose-optimization program which includes automated exposure control, adjustment of the mA and/or kV according to patient size and/or use of iterative reconstruction technique.  CONTRAST:  119m OMNIPAQUE IOHEXOL 300 MG/ML  SOLN  COMPARISON:  None Available.  FINDINGS: Lower chest: No acute abnormality.  Hepatobiliary: Liver is normal in size and contour. Hypodensity anteriorly adjacent to the falciform ligament likely represents focal fatty infiltration. 2.1 cm hemangioma in the central right hepatic lobe. Subcentimeter hypodensity in segment 6 May represent a cyst or hemangioma. Gallbladder appears grossly normal. No biliary ductal dilatation identified.  Pancreas: Unremarkable. No pancreatic ductal dilatation or surrounding  inflammatory changes.  Spleen: Normal in size without focal abnormality.  Adrenals/Urinary Tract: Adrenal glands are unremarkable. Kidneys are normal, without renal calculi, focal lesion, or hydronephrosis. Bladder is unremarkable.  Stomach/Bowel: Stomach is within normal limits. Appendix appears normal. No evidence of bowel wall thickening, distention, or inflammatory changes.  Vascular/Lymphatic: Aortic atherosclerosis. No enlarged abdominal or pelvic lymph nodes.  Reproductive: Heterogeneous uterus with multiple fibroids. No suspicious adnexal mass identified.  Other: No ascites.  Musculoskeletal: No acute or significant osseous findings.  IMPRESSION: 1. No acute process identified. 2. Hepatic hemangioma and a subcentimeter hypodensity which may represent cyst or hemangioma. 3. Fibroid uterus.   Electronically Signed By: DOfilia NeasM.D. On: 05/07/2022 12:25

## 2022-08-13 ENCOUNTER — Encounter: Payer: Self-pay | Admitting: Psychiatry

## 2022-08-14 NOTE — Patient Instructions (Signed)
Preparing for your Surgery   Plan for surgery on September 03, 2022 with Dr. Bernadene Bell at [facility] will be scheduled for abdominal radical hysterectomy (removal of the uterus, cervix, and surrounding tissues), bilateral salpingectomy (removal of both fallopian tubes) with pelvic lymph node dissection.    Pre-operative Testing -You will receive a phone call from presurgical testing at [facility] to arrange for a pre-operative appointment and lab work.   -Bring your insurance card, copy of an advanced directive if applicable, medication list   -At that visit, you will be asked to sign a consent for a possible blood transfusion in case a transfusion becomes necessary during surgery.  The need for a blood transfusion is rare but having consent is a necessary part of your care.      -You should not be taking blood thinners or aspirin at least ten days prior to surgery unless instructed by your surgeon.   -Do not take supplements such as fish oil (omega 3), red yeast rice, turmeric before your surgery. You want to avoid medications with aspirin in them including headache powders such as BC or Goody's), Excedrin migraine.   Day Before Surgery at [facility] will be asked to take in a light diet the day before surgery. You will be advised you can have clear liquids up until 3 hours before your surgery.     Eat a light diet the day before surgery.  Examples including soups, broths, toast, yogurt, mashed potatoes.  AVOID GAS PRODUCING FOODS. Things to avoid include carbonated beverages (fizzy beverages, sodas), raw fruits and raw vegetables (uncooked), or beans.    If your bowels are filled with gas, your surgeon will have difficulty visualizing your pelvic organs which increases your surgical risks.   Your role in recovery Your role is to become active as soon as directed by your doctor, while still giving yourself time to heal.  Rest when you feel tired. You will be asked  to do the following in order to speed your recovery:   - Cough and breathe deeply. This helps to clear and expand your lungs and can prevent pneumonia after surgery.  - [place]. Do mild physical activity. Walking or moving your legs help your circulation and body functions return to normal. Do not try to get up or walk alone the first time after surgery.   -If you develop swelling on one leg or the other, pain in the back of your leg, redness/warmth in one of your legs, please call the office or go to the Emergency Room to have a doppler to rule out a blood clot. For shortness of breath, chest pain-seek care in the Emergency Room as soon as possible. - Actively manage your pain. Managing your pain lets you move in comfort. We will ask you to rate your pain on a scale of zero to 10. It is your responsibility to tell your doctor or nurse where and how much you hurt so your pain can be treated.   Special Considerations -If you are diabetic, you may be placed on insulin after surgery to have closer control over your blood sugars to promote healing and recovery.  This does not mean that you will be discharged on insulin.  If applicable, your oral antidiabetics will be resumed when you are tolerating a solid diet.   -Your final pathology results from surgery should be available around one week after surgery and the results will be  relayed to you when available.   -Dr. Lahoma Crocker is the surgeon that assists your GYN Oncologist with surgery.  If you end up staying the night, the next day after your surgery you will either see Dr. Ernestina Patches, Dr. Berline Lopes, or Dr. Lahoma Crocker.   -FMLA forms can be faxed to [phone] and please allow 5-7 business days for completion.   Pain Management After Surgery -You will be prescribed pain medication once you leave the hospital to take if you need it for severe pain.   -Make sure that you have Tylenol and Ibuprofen IF YOU ARE ABLE TO  TAKE THESE MEDICATIONS at home to use on a regular basis after surgery for pain control. We recommend alternating the medications every hour to six hours since they work differently and are processed in the body differently for pain relief.   -Review the attached handout on narcotic use and their risks and side effects.    Bowel Regimen -You will be prescribed Sennakot-S to take nightly to prevent constipation especially if you are taking the narcotic pain medication intermittently.  It is important to prevent constipation and drink adequate amounts of liquids. You can stop taking this medication when you are not taking pain medication and you are back on your normal bowel routine.   Risks of Surgery Risks of surgery are low but include bleeding, infection, damage to surrounding structures, re-operation, blood clots, and very rarely death.     Blood Transfusion Information (For the consent to be signed before surgery)   We will be checking your blood type before surgery so in case of emergencies, we will know what type of blood you would need.                                             WHAT IS A BLOOD TRANSFUSION?   A transfusion is the replacement of blood or some of its parts. Blood is made up of multiple cells which provide different functions. Red blood cells carry oxygen and are used for blood loss replacement. White blood cells fight against infection. Platelets control bleeding. Plasma helps clot blood. Other blood products are available for specialized needs, such as hemophilia or other clotting disorders. BEFORE THE TRANSFUSION  Who gives blood for transfusions?  You may be able to donate blood to be used at a later date on yourself (autologous donation). Relatives can be asked to donate blood. This is generally not any safer than if you have received blood from a stranger. The same precautions are taken to ensure safety when a relative's blood is donated. Healthy volunteers who  are fully evaluated to make sure their blood is safe. This is blood bank blood. Transfusion therapy is the safest it has ever been in the practice of medicine. Before blood is taken from a donor, a complete history is taken to make sure that person has no history of diseases nor engages in risky social behavior (examples are intravenous drug use or sexual activity with multiple partners). The donor's travel history is screened to minimize risk of transmitting infections, such as malaria. The donated blood is tested for signs of infectious diseases, such as HIV and hepatitis. The blood is then tested to be sure it is compatible with you in order to minimize the chance of a transfusion reaction. If you or a relative donates blood, this is often  done in anticipation of surgery and is not appropriate for emergency situations. It takes many days to process the donated blood. RISKS AND COMPLICATIONS Although transfusion therapy is very safe and saves many lives, the main dangers of transfusion include:  Getting an infectious disease. Developing a transfusion reaction. This is an allergic reaction to something in the blood you were given. Every precaution is taken to prevent this. The decision to have a blood transfusion has been considered carefully by your caregiver before blood is given. Blood is not given unless the benefits outweigh the risks.   AFTER SURGERY INSTRUCTIONS   Return to work: 4-6 weeks if applicable   You will have a white honeycomb dressing over your larger incision. This dressing can be removed 5 days after surgery and you do not need to reapply a new dressing. Once you remove the dressing, you will notice that you have the surgical glue (dermabond) on the incision and this will peel off on its own. You can get this dressing wet in the shower the days after surgery prior to removal on the 5th day.    Activity: 1. Be up and out of the bed during the day.  Take a nap if needed.  You may  walk up steps but be careful and use the hand rail.  Stair climbing will tire you more than you think, you may need to stop part way and rest.    2. No lifting or straining for 6 weeks over 10 pounds. No pushing, pulling, straining for 6 weeks.   3. No driving for 2 week(s).  Do not drive if you are taking narcotic pain medicine and make sure that your reaction time has returned.    4. You can shower as soon as the next day after surgery. Shower daily.  Use your regular soap and water (not directly on the incision) and pat your incision(s) dry afterwards; don't rub.  No tub baths or submerging your body in water until cleared by your surgeon. If you have the soap that was given to you by pre-surgical testing that was used before surgery, you do not need to use it afterwards because this can irritate your incisions.    5. No sexual activity and nothing in the vagina for 10 weeks.   6. You may experience a small amount of clear drainage from your incision, which is normal.  If the drainage persists, increases, or changes color please call the office.   7. Do not use creams, lotions, or ointments such as neosporin on your incisions after surgery until advised by your surgeon because they can cause removal of the dermabond glue on your incisions.     8. You may experience vaginal spotting after surgery or around the 6-8 week mark from surgery when the stitches at the top of the vagina begin to dissolve.  The spotting is normal but if you experience heavy bleeding, call our office.   9. Take Tylenol or ibuprofen first for pain if you are able to take these medications and only use narcotic pain medication for severe pain not relieved by the Tylenol or Ibuprofen.  Monitor your Tylenol intake to a max of 4,000 mg in a 24 hour period. You can alternate these medications after surgery.   Diet: 1. Low sodium Heart Healthy Diet is recommended but you are cleared to resume your normal (before surgery) diet  after your procedure.   2. It is safe to use a laxative, such as Miralax  or Colace, if you have difficulty moving your bowels. You will be prescribed Sennakot-S to take at bedtime every evening after surgery to keep bowel movements regular and to prevent constipation.     Wound Care: 1. Keep clean and dry.  Shower daily.   Reasons to call the Doctor: Fever - Oral temperature greater than 100.4 degrees Fahrenheit Foul-smelling vaginal discharge Difficulty urinating Nausea and vomiting Increased pain at the site of the incision that is unrelieved with pain medicine. Difficulty breathing with or without chest pain New calf pain especially if only on one side Sudden, continuing increased vaginal bleeding with or without clots.   Contacts: For questions or concerns you should contact:   Dr. Bernadene Bell at [facility], NP at [phone]   After Hours: call [phone] and have the GYN Oncologist paged/contacted (after 5 pm or on the weekends).   Messages sent via mychart are for non-urgent matters and are not responded to after hours so for urgent needs, please call the after hours number.

## 2022-08-14 NOTE — Progress Notes (Signed)
Patient here for new patient consultation with Dr. Ernestina Patches and for a pre-operative appointment prior to her scheduled surgery on September 03, 2022. She is scheduled for abdominal radical hysterectomy, bilateral salpingectomy with pelvic lymph node dissection. The surgery was discussed in detail.  See after visit summary for additional details. Visual aids used to discuss items related to surgery.    Discussed post-op pain management in detail including the aspects of the enhanced recovery pathway.  Advised her that a new prescription would be sent in at discharge and it is only to be used for after her upcoming surgery.  We discussed the use of tylenol post-op and to monitor for a maximum of 4,000 mg in a 24 hour period.  Also plan to prescribe sennakot to be used after surgery and to hold if having loose stools.  Discussed bowel regimen in detail.     Discussed the use of SCDs and measures to take at home to prevent DVT including frequent mobility.  Reportable signs and symptoms of DVT discussed. Post-operative instructions discussed and expectations for after surgery. Incisional care discussed as well including reportable signs and symptoms including erythema, drainage, wound separation.     10 minutes spent with the patient.  Verbalizing understanding of material discussed. No needs or concerns voiced at the end of the visit.   Advised patient and family to call for any needs.  Advised that her post-operative medications had been prescribed and could be picked up at any time.    This appointment is included in the global surgical bundle as pre-operative teaching and has no charge.

## 2022-08-16 ENCOUNTER — Encounter: Payer: Self-pay | Admitting: Psychiatry

## 2022-08-16 ENCOUNTER — Other Ambulatory Visit: Payer: Self-pay | Admitting: Gynecologic Oncology

## 2022-08-16 DIAGNOSIS — C539 Malignant neoplasm of cervix uteri, unspecified: Secondary | ICD-10-CM

## 2022-08-19 ENCOUNTER — Inpatient Hospital Stay
Payer: Commercial Managed Care - HMO | Attending: Licensed Clinical Social Worker | Admitting: Licensed Clinical Social Worker

## 2022-08-19 NOTE — Progress Notes (Signed)
[place] Work  Initial Assessment   Sarah Morris is a 41 y.o. year old female contacted by phone. Clinical Social Work was referred by self for assessment of psychosocial needs.   SDOH (Social Determinants of Health) assessments performed: Yes SDOH Interventions    Flowsheet Row Clinical Support from 08/19/2022 in [facility]  SDOH Interventions   Food Insecurity Interventions Other (Comment)  [food pantries]  Housing Interventions Intervention Not Indicated  Transportation Interventions Intervention Not Indicated  Utilities Interventions Other (Comment)  [cancer foundations]  Financial Strain Interventions Other (Comment)  Chief Executive Officer, food pantry]       SDOH Screenings   Food Insecurity: Food Insecurity Present (08/19/2022)  Housing: Low Risk  (08/19/2022)  Transportation Needs: No Transportation Needs (08/19/2022)  Utilities: Not At Risk (08/19/2022)  Financial Resource Strain: High Risk (08/19/2022)  Tobacco Use: High Risk (08/16/2022)     Distress Screen completed: No     No data to display            Family/Social Information:  Housing Arrangement: patient lives alone Family members/support persons in your life? Mom, boyfriend Transportation concerns: no  Employment: Unemployed - laid off within last couple of months.  Income source: Unemployment Financial concerns: Yes, current concerns Type of concern: Utilities and Food Food access concerns: yes, concerned about costs while unable to work Religious or spiritual practice: Not known Services Currently in place:  Section 8, working on [employer] benefits  Coping/ Adjustment to diagnosis: Patient understands treatment plan and what happens next? 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 Current coping skills/ strengths: Other: able to access community resources     SUMMARY: Current SDOH Barriers:  Financial constraints related to loss of work, especially with food and utilities  Clinical Social Work Clinical Goal(s):  Freight forwarder options for unmet needs related to:  Sales promotion account executive  and Food Insecurity   Interventions: Discussed the importance of support during treatment Informed patient of the support team roles and support services at [facility] Provided [place] contact information and encouraged patient to call with any questions or concerns Completed referral to [employer] for potential financial assistance with utilities and food Provided information on [place] assistance and encouraged pt to call Mailed information on local food pantries and other resources   Follow Up Plan: Patient will contact [facility]. CSW will follow-up with [facility] Patient verbalizes understanding of plan: Yes    Essam Lowdermilk E Adley Mazurowski, LCSW

## 2022-08-21 ENCOUNTER — Ambulatory Visit (HOSPITAL_COMMUNITY)
Admission: RE | Admit: 2022-08-21 | Discharge: 2022-08-21 | Disposition: A | Discharge status: Home / Self Care | Payer: Commercial Managed Care - HMO | Source: Ambulatory Visit | Attending: Psychiatry | Admitting: Psychiatry

## 2022-08-21 DIAGNOSIS — C539 Malignant neoplasm of cervix uteri, unspecified: Secondary | ICD-10-CM | POA: Diagnosis not present

## 2022-08-21 LAB — GLUCOSE, CAPILLARY: Glucose-Capillary: 107 mg/dL — ABNORMAL HIGH (ref 70–99)

## 2022-08-27 NOTE — Patient Instructions (Signed)
SURGICAL WAITING ROOM VISITATION Patients having surgery or a procedure may have no more than 2 support people in the waiting area - these visitors may rotate.   Children under the age of 41 must have an adult with them who is not the patient. If the patient needs to stay at the hospital during part of their recovery, the visitor guidelines for inpatient rooms apply. Pre-op nurse will coordinate an appropriate time for 1 support person to accompany patient in pre-op.  This support person may not rotate.    Please refer to the [facility] website for the visitor guidelines for Inpatients (after your surgery is over and you are in a regular room).    Your procedure is scheduled on: 09/03/22   Report to [facility] Main Entrance    Report to admitting at 7:50 AM   Call this number if you have problems the morning of surgery [phone]   Do not eat food :After Midnight.   After Midnight you may have the following liquids until 7:05 AM DAY OF SURGERY  Water Non-Citrus Juices (without pulp, NO RED) Carbonated Beverages Black Coffee (NO MILK/CREAM OR CREAMERS, sugar ok)  Clear Tea (NO MILK/CREAM OR CREAMERS, sugar ok) regular and decaf                             Plain Jell-O (NO RED)                                           Fruit ices (not with fruit pulp, NO RED)                                     Popsicles (NO RED)                                                               Sports drinks like Gatorade (NO RED)                      If you have questions, please contact your surgeon's office.   FOLLOW BOWEL PREP AND ANY ADDITIONAL PRE OP INSTRUCTIONS YOU RECEIVED FROM YOUR SURGEON'S OFFICE!!!     Oral Hygiene is also important to reduce your risk of infection.                                    Remember - BRUSH YOUR TEETH THE MORNING OF SURGERY WITH YOUR REGULAR TOOTHPASTE   Do NOT smoke after Midnight   Take these medicines the morning of surgery with A SIP OF WATER:  None   DO NOT TAKE ANY ORAL DIABETIC MEDICATIONS DAY OF YOUR SURGERY  Bring CPAP mask and tubing day of surgery.                              You may not have any metal on your body including hair pins, jewelry, and body  piercing             Do not wear make-up, lotions, powders, perfumes, or deodorant  Do not wear nail polish including gel and S&S, artificial/acrylic nails, or any other type of covering on natural nails including finger and toenails. If you have artificial nails, gel coating, etc. that needs to be removed by a nail salon please have this removed prior to surgery or surgery may need to be canceled/ delayed if the surgeon/ anesthesia feels like they are unable to be safely monitored.   Do not shave  48 hours prior to surgery.    Do not bring valuables to the hospital. [place].   Contacts, dentures or bridgework may not be worn into surgery.   Bring small overnight bag day of surgery.   DO NOT [place]. PHARMACY WILL DISPENSE MEDICATIONS LISTED ON YOUR MEDICATION LIST TO YOU DURING YOUR ADMISSION [place]!    Special Instructions: Bring a copy of your healthcare power of attorney and living will documents the day of surgery if you haven't scanned them before.              Please read over the following fact sheets you were given: IF [place] [phone] Schneiders   If you received a COVID test during your pre-op visit  it is requested that you wear a mask when out in public, stay away from anyone that may not be feeling well and notify your surgeon if you develop symptoms. If you test positive for Covid or have been in contact with anyone that has tested positive in the last 10 days please notify you surgeon.     [facility] - Preparing for Surgery Before surgery, you can play an important role.  Because skin is not sterile,  your skin needs to be as free of germs as possible.  You can reduce the number of germs on your skin by washing with CHG (chlorahexidine gluconate) soap before surgery.  CHG is an antiseptic cleaner which kills germs and bonds with the skin to continue killing germs even after washing. Please DO NOT use if you have an allergy to CHG or antibacterial soaps.  If your skin becomes reddened/irritated stop using the CHG and inform your nurse when you arrive at Short Stay. Do not shave (including legs and underarms) for at least 48 hours prior to the first CHG shower.  You may shave your face/neck.  Please follow these instructions carefully:  1.  Shower with CHG Soap the night before surgery and the  morning of surgery.  2.  If you choose to wash your hair, wash your hair first as usual with your normal  shampoo.  3.  After you shampoo, rinse your hair and body thoroughly to remove the shampoo.                             4.  Use CHG as you would any other liquid soap.  You can apply chg directly to the skin and wash.  Gently with a scrungie or clean washcloth.  5.  Apply the CHG Soap to your body ONLY FROM THE NECK DOWN.   Do   not use on face/ open  Wound or open sores. Avoid contact with eyes, ears mouth and   genitals (private parts).                       Wash face,  Genitals (private parts) with your normal soap.             6.  Wash thoroughly, paying special attention to the area where your    surgery  will be performed.  7.  Thoroughly rinse your body with warm water from the neck down.  8.  DO NOT shower/wash with your normal soap after using and rinsing off the CHG Soap.                9.  Pat yourself dry with a clean towel.            10.  Wear clean pajamas.            11.  Place clean sheets on your bed the night of your first shower and do not  sleep with pets. Day of Surgery : Do not apply any lotions/deodorants the morning of surgery.  Please wear clean  clothes to the hospital/surgery center.  FAILURE TO FOLLOW THESE INSTRUCTIONS MAY RESULT IN THE CANCELLATION OF YOUR SURGERY  PATIENT SIGNATURE_________________________________  NURSE SIGNATURE__________________________________  ________________________________________________________________________   Sarah Morris  An incentive spirometer is a tool that can help keep your lungs clear and active. This tool measures how well you are filling your lungs with each breath. Taking long deep breaths may help reverse or decrease the chance of developing breathing (pulmonary) problems (especially infection) following: A long period of time when you are unable to move or be active. BEFORE THE PROCEDURE  If the spirometer includes an indicator to show your best effort, your nurse or respiratory therapist will set it to a desired goal. If possible, sit up straight or lean slightly forward. Try not to slouch. Hold the incentive spirometer in an upright position. INSTRUCTIONS FOR USE  Sit on the edge of your bed if possible, or sit up as far as you can in bed or on a chair. Hold the incentive spirometer in an upright position. Breathe out normally. Place the mouthpiece in your mouth and seal your lips tightly around it. Breathe in slowly and as deeply as possible, raising the piston or the ball toward the top of the column. Hold your breath for 3-5 seconds or for as long as possible. Allow the piston or ball to fall to the bottom of the column. Remove the mouthpiece from your mouth and breathe out normally. Rest for a few seconds and repeat Steps 1 through 7 at least 10 times every 1-2 hours when you are awake. Take your time and take a few normal breaths between deep breaths. The spirometer may include an indicator to show your best effort. Use the indicator as a goal to work toward during each repetition. After each set of 10 deep breaths, practice coughing to be sure your lungs are clear. If  you have an incision (the cut made at the time of surgery), support your incision when coughing by placing a pillow or rolled up towels firmly against it. Once you are able to get out of bed, walk around indoors and cough well. You may stop using the incentive spirometer when instructed by your caregiver.  RISKS AND COMPLICATIONS Take your time so you do not get dizzy or light-headed. If you are in pain, you  may need to take or ask for pain medication before doing incentive spirometry. It is harder to take a deep breath if you are having pain. AFTER USE Rest and breathe slowly and easily. It can be helpful to keep track of a log of your progress. Your caregiver can provide you with a simple table to help with this. If you are using the spirometer at home, follow these instructions: [place] IF:  You are having difficultly using the spirometer. You have trouble using the spirometer as often as instructed. Your pain medication is not giving enough relief while using the spirometer. You develop fever of 100.5 F (38.1 C) or higher. SEEK IMMEDIATE MEDICAL CARE IF:  You cough up bloody sputum that had not been present before. You develop fever of 102 F (38.9 C) or greater. You develop worsening pain at or near the incision site. MAKE SURE YOU:  Understand these instructions. Will watch your condition. Will get help right away if you are not doing well or get worse. Document Released: 03/17/2007 Document Revised: 01/27/2012 Document Reviewed: 05/18/2007 ExitCare Patient Information 2014 Exit[employer], [place].   ________________________________________________________________________  WHAT IS A BLOOD TRANSFUSION? Blood Transfusion Information  A transfusion is the replacement of blood or some of its parts. Blood is made up of multiple cells which provide different functions. Red blood cells carry oxygen and are used for blood loss replacement. White blood cells fight against  infection. Platelets control bleeding. Plasma helps clot blood. Other blood products are available for specialized needs, such as hemophilia or other clotting disorders. BEFORE THE TRANSFUSION  Who gives blood for transfusions?  Healthy volunteers who are fully evaluated to make sure their blood is safe. This is blood bank blood. Transfusion therapy is the safest it has ever been in the practice of medicine. Before blood is taken from a donor, a complete history is taken to make sure that person has no history of diseases nor engages in risky social behavior (examples are intravenous drug use or sexual activity with multiple partners). The donor's travel history is screened to minimize risk of transmitting infections, such as malaria. The donated blood is tested for signs of infectious diseases, such as HIV and hepatitis. The blood is then tested to be sure it is compatible with you in order to minimize the chance of a transfusion reaction. If you or a relative donates blood, this is often done in anticipation of surgery and is not appropriate for emergency situations. It takes many days to process the donated blood. RISKS AND COMPLICATIONS Although transfusion therapy is very safe and saves many lives, the main dangers of transfusion include:  Getting an infectious disease. Developing a transfusion reaction. This is an allergic reaction to something in the blood you were given. Every precaution is taken to prevent this. The decision to have a blood transfusion has been considered carefully by your caregiver before blood is given. Blood is not given unless the benefits outweigh the risks. AFTER THE TRANSFUSION Right after receiving a blood transfusion, you will usually feel much better and more energetic. This is especially true if your red blood cells have gotten low (anemic). The transfusion raises the level of the red blood cells which carry oxygen, and this usually causes an energy increase. The  nurse administering the transfusion will monitor you carefully for complications. HOME CARE INSTRUCTIONS  No special instructions are needed after a transfusion. You may find your energy is better. Speak with your caregiver about any limitations on  activity for underlying diseases you may have. SEEK MEDICAL CARE IF:  Your condition is not improving after your transfusion. You develop redness or irritation at the intravenous (IV) site. SEEK IMMEDIATE MEDICAL CARE IF:  Any of the following symptoms occur over the next 12 hours: Shaking chills. You have a temperature by mouth above 102 F (38.9 C), not controlled by medicine. Chest, back, or muscle pain. People around you feel you are not acting correctly or are confused. Shortness of breath or difficulty breathing. Dizziness and fainting. You get a rash or develop hives. You have a decrease in urine output. Your urine turns a dark color or changes to pink, red, or brown. Any of the following symptoms occur over the next 10 days: You have a temperature by mouth above 102 F (38.9 C), not controlled by medicine. Shortness of breath. Weakness after normal activity. The white part of the eye turns yellow (jaundice). You have a decrease in the amount of urine or are urinating less often. Your urine turns a dark color or changes to pink, red, or brown. Document Released: 11/01/2000 Document Revised: 01/27/2012 Document Reviewed: 06/20/2008 [facility] Patient Information 2014 [place], [place].  _______________________________________________________________________

## 2022-08-27 NOTE — Progress Notes (Signed)
COVID Vaccine Completed:  Date of COVID positive in last 90 days:  PCP - [facility] Cardiologist -   PET- 08/21/22 Epic Chest x-ray -  EKG -  Stress Test -  ECHO -  Cardiac Cath -  Pacemaker/ICD device last checked: Spinal Cord Stimulator:  Bowel Prep - light diet day before  Sleep Study -  CPAP -   Fasting Blood Sugar -  Checks Blood Sugar _____ times a day  Blood Thinner Instructions: Aspirin Instructions: Last Dose:  Activity level:  Can go up a flight of stairs and perform activities of daily living without stopping and without symptoms of chest pain or shortness of breath.  Able to exercise without symptoms  Unable to go up a flight of stairs without symptoms of     Anesthesia review:   Patient denies shortness of breath, fever, cough and chest pain at PAT appointment  Patient verbalized understanding of instructions that were given to them at the PAT appointment. Patient was also instructed that they will need to review over the PAT instructions again at home before surgery.

## 2022-08-28 ENCOUNTER — Encounter (HOSPITAL_COMMUNITY): Payer: Self-pay

## 2022-08-28 ENCOUNTER — Encounter (HOSPITAL_COMMUNITY)
Admission: RE | Admit: 2022-08-28 | Discharge: 2022-08-28 | Disposition: A | Discharge status: Home / Self Care | Payer: Commercial Managed Care - HMO | Source: Ambulatory Visit | Attending: Psychiatry | Admitting: Psychiatry

## 2022-08-28 DIAGNOSIS — Z01812 Encounter for preprocedural laboratory examination: Secondary | ICD-10-CM | POA: Insufficient documentation

## 2022-08-28 DIAGNOSIS — C539 Malignant neoplasm of cervix uteri, unspecified: Secondary | ICD-10-CM | POA: Insufficient documentation

## 2022-08-28 LAB — CBC
HCT: 42.9 % (ref 36.0–46.0)
Hemoglobin: 14.2 g/dL (ref 12.0–15.0)
MCH: 34.5 pg — ABNORMAL HIGH (ref 26.0–34.0)
MCHC: 33.1 g/dL (ref 30.0–36.0)
MCV: 104.4 fL — ABNORMAL HIGH (ref 80.0–100.0)
Platelets: 300 10*3/uL (ref 150–400)
RBC: 4.11 MIL/uL (ref 3.87–5.11)
RDW: 12.2 % (ref 11.5–15.5)
WBC: 7.2 10*3/uL (ref 4.0–10.5)
nRBC: 0 % (ref 0.0–0.2)

## 2022-08-28 LAB — COMPREHENSIVE METABOLIC PANEL
ALT: 15 U/L (ref 0–44)
AST: 21 U/L (ref 15–41)
Albumin: 4.6 g/dL (ref 3.5–5.0)
Alkaline Phosphatase: 62 U/L (ref 38–126)
Anion gap: 7 (ref 5–15)
BUN: 11 mg/dL (ref 6–20)
CO2: 23 mmol/L (ref 22–32)
Calcium: 9.2 mg/dL (ref 8.9–10.3)
Chloride: 107 mmol/L (ref 98–111)
Creatinine, Ser: 0.77 mg/dL (ref 0.44–1.00)
GFR, Estimated: >60 mL/min (ref >60)
Glucose, Bld: 79 mg/dL (ref 70–99)
Potassium: 3.9 mmol/L (ref 3.5–5.1)
Sodium: 137 mmol/L (ref 135–145)
Total Bilirubin: 1 mg/dL (ref 0.3–1.2)
Total Protein: 8.3 g/dL — ABNORMAL HIGH (ref 6.5–8.1)

## 2022-08-28 LAB — APTT: aPTT: 30 seconds (ref 24–36)

## 2022-08-28 LAB — PROTIME-INR
INR: 1 (ref 0.8–1.2)
Prothrombin Time: 13.5 seconds (ref 11.4–15.2)

## 2022-08-29 ENCOUNTER — Inpatient Hospital Stay: Payer: Commercial Managed Care - HMO | Admitting: Licensed Clinical Social Worker

## 2022-08-29 ENCOUNTER — Telehealth: Payer: Self-pay | Admitting: *Deleted

## 2022-08-29 ENCOUNTER — Encounter: Payer: Self-pay | Admitting: Gynecologic Oncology

## 2022-08-29 NOTE — Progress Notes (Signed)
Reader CSW Progress Note  Patient called to request letter stating that she cannot work due to her cancer diagnosis and treatment to be sent to DSS for her food stamp benefits. CSW sent request to medical team as it needs signature from her provider.  CSW also confirmed that pt received request from [place] for account information for them to assist with utility bills.  No other needs at this time.    Sarah Morris E Zetta Stoneman, LCSW

## 2022-08-29 NOTE — Telephone Encounter (Signed)
E-mail address didn't send, called [phone] and [facility] to the office back

## 2022-08-29 NOTE — Telephone Encounter (Signed)
Patient called and stated "I need a letter emailed to the person I am dealing with for my food stamps. They said they need a letter from the doctor; not just my word. The letter needs to state that I am having surgery and how long I will be out of work to continue my food stamps. The letter needs to be emailed to [email or web address] (phone number [phone])." Explained that the message will be given to the provider and the office will call her back once the letter is ready.

## 2022-08-30 NOTE — Telephone Encounter (Signed)
Spoke with Ms Sarah Morris and received correct e-mail.  [email or web address]   Letter emailed

## 2022-09-02 ENCOUNTER — Encounter: Payer: Self-pay | Admitting: Gynecologic Oncology

## 2022-09-02 ENCOUNTER — Telehealth: Payer: Self-pay | Admitting: Psychiatry

## 2022-09-02 ENCOUNTER — Telehealth: Payer: Self-pay | Admitting: Surgery

## 2022-09-02 NOTE — Progress Notes (Signed)
Spoke to patient and gave her updated arrival time of 5:15 for surgery on 09/10/22.  Patient was reminded no solid food after midnight and that she can have clear liquids from midnight until 4:30 am the day of surgery.  No change in medical history, surgery rescheduled due to insurance issues.   Patient stated understanding of new instructions.

## 2022-09-02 NOTE — Telephone Encounter (Signed)
New letter has been sent.

## 2022-09-02 NOTE — Telephone Encounter (Signed)
Called patient regarding her plan for surgery and insurance concerns.  She reports that she currently has a different insurance than [employer] and this may be out of network for [facility].  She reports that she is reapplied for [employer] which should take effect 09/18/2022.  Discussed moving surgery to 09/24/2022.  Patient will notify our clinic on 09/18/2022 with her new insurance information so that we can proceed with surgery.

## 2022-09-02 NOTE — Telephone Encounter (Signed)
Pt called in to check status of request for food stamps continuance. Previous letter sent has surgery date for 10/17 so new letter will need to be sent with new surgery date of 11/7.

## 2022-09-02 NOTE — Telephone Encounter (Addendum)
Due to insurance issues, surgery rescheduled to 10/24 from 11/7. Letter resent with new date. Patient aware of date change.

## 2022-09-03 DIAGNOSIS — C539 Malignant neoplasm of cervix uteri, unspecified: Secondary | ICD-10-CM

## 2022-09-03 LAB — TYPE AND SCREEN
ABO/RH(D): O POS
Antibody Screen: NEGATIVE

## 2022-09-09 ENCOUNTER — Telehealth: Payer: Self-pay | Admitting: Surgery

## 2022-09-09 NOTE — Telephone Encounter (Signed)
Telephone call to check on pre-operative status.  Patient compliant with pre-operative instructions.  Reinforced nothing to eat after midnight. Clear liquids until 4:15am. Patient to arrive at 5am.  No questions or concerns voiced.  Instructed to call for any needs.

## 2022-09-10 ENCOUNTER — Other Ambulatory Visit: Payer: Self-pay

## 2022-09-10 ENCOUNTER — Inpatient Hospital Stay (HOSPITAL_COMMUNITY)
Admission: RE | Admit: 2022-09-10 | Discharge: 2022-09-14 | DRG: 740 | Disposition: A | Discharge status: Home / Self Care | Payer: Commercial Managed Care - HMO | Source: Ambulatory Visit | Attending: Psychiatry | Admitting: Psychiatry

## 2022-09-10 ENCOUNTER — Encounter (HOSPITAL_COMMUNITY): Payer: Self-pay | Admitting: Psychiatry

## 2022-09-10 ENCOUNTER — Inpatient Hospital Stay (HOSPITAL_COMMUNITY): Payer: Commercial Managed Care - HMO | Admitting: Physician Assistant

## 2022-09-10 ENCOUNTER — Inpatient Hospital Stay: Payer: Commercial Managed Care - HMO | Admitting: Gynecologic Oncology

## 2022-09-10 ENCOUNTER — Inpatient Hospital Stay: Payer: Commercial Managed Care - HMO | Admitting: Psychiatry

## 2022-09-10 ENCOUNTER — Encounter (HOSPITAL_COMMUNITY)
Admission: RE | Disposition: A | Discharge status: Home / Self Care | Payer: Self-pay | Source: Ambulatory Visit | Attending: Psychiatry

## 2022-09-10 DIAGNOSIS — D62 Acute posthemorrhagic anemia: Secondary | ICD-10-CM | POA: Diagnosis not present

## 2022-09-10 DIAGNOSIS — Z888 Allergy status to other drugs, medicaments and biological substances status: Secondary | ICD-10-CM

## 2022-09-10 DIAGNOSIS — Z91018 Allergy to other foods: Secondary | ICD-10-CM | POA: Diagnosis not present

## 2022-09-10 DIAGNOSIS — J398 Other specified diseases of upper respiratory tract: Secondary | ICD-10-CM

## 2022-09-10 DIAGNOSIS — C539 Malignant neoplasm of cervix uteri, unspecified: Principal | ICD-10-CM

## 2022-09-10 DIAGNOSIS — R062 Wheezing: Secondary | ICD-10-CM | POA: Diagnosis not present

## 2022-09-10 DIAGNOSIS — F17210 Nicotine dependence, cigarettes, uncomplicated: Secondary | ICD-10-CM | POA: Diagnosis present

## 2022-09-10 DIAGNOSIS — F419 Anxiety disorder, unspecified: Secondary | ICD-10-CM | POA: Diagnosis present

## 2022-09-10 DIAGNOSIS — F17200 Nicotine dependence, unspecified, uncomplicated: Secondary | ICD-10-CM

## 2022-09-10 HISTORY — PX: BILATERAL SALPINGECTOMY: SHX5743

## 2022-09-10 HISTORY — DX: Malignant neoplasm of cervix uteri, unspecified: C53.9

## 2022-09-10 HISTORY — PX: LYMPH NODE DISSECTION: SHX5087

## 2022-09-10 HISTORY — PX: RADICAL HYSTERECTOMY: SHX2283

## 2022-09-10 LAB — POCT PREGNANCY, URINE: Preg Test, Ur: NEGATIVE

## 2022-09-10 SURGERY — HYSTERECTOMY, RADICAL, ABDOMINAL
Anesthesia: General

## 2022-09-10 MED ORDER — BUPIVACAINE LIPOSOME 1.3 % IJ SUSP
INTRAMUSCULAR | Status: AC
  Filled 2022-09-10: qty 20

## 2022-09-10 MED ORDER — CHEWING GUM (ORBIT) SUGAR FREE
1.0000 | CHEWING_GUM | Freq: Three times a day (TID) | ORAL | Status: DC
  Administered 2022-09-11 – 2022-09-14 (×10): 1 via ORAL
  Filled 2022-09-10: qty 1

## 2022-09-10 MED ORDER — KETOROLAC TROMETHAMINE 15 MG/ML IJ SOLN
15.0000 mg | Freq: Four times a day (QID) | INTRAMUSCULAR | Status: AC
  Administered 2022-09-10 – 2022-09-11 (×4): 15 mg via INTRAVENOUS
  Filled 2022-09-10 (×4): qty 1

## 2022-09-10 MED ORDER — KETOROLAC TROMETHAMINE 15 MG/ML IJ SOLN: 15.0000 mg | INTRAMUSCULAR | Status: DC

## 2022-09-10 MED ORDER — LABETALOL HCL 5 MG/ML IV SOLN
INTRAVENOUS | Status: DC | PRN
  Administered 2022-09-10 (×2): 5 mg via INTRAVENOUS

## 2022-09-10 MED ORDER — HYDROMORPHONE HCL 1 MG/ML IJ SOLN
0.5000 mg | INTRAMUSCULAR | Status: DC | PRN
  Administered 2022-09-10 – 2022-09-11 (×2): 0.5 mg via INTRAVENOUS
  Filled 2022-09-10 (×2): qty 0.5

## 2022-09-10 MED ORDER — FENTANYL CITRATE PF 50 MCG/ML IJ SOSY
PREFILLED_SYRINGE | INTRAMUSCULAR | Status: AC
  Filled 2022-09-10: qty 3

## 2022-09-10 MED ORDER — THIAMINE MONONITRATE 100 MG PO TABS
100.0000 mg | ORAL_TABLET | Freq: Every day | ORAL | Status: DC
  Administered 2022-09-11 – 2022-09-14 (×4): 100 mg via ORAL
  Filled 2022-09-10 (×5): qty 1

## 2022-09-10 MED ORDER — FOLIC ACID 1 MG PO TABS
1.0000 mg | ORAL_TABLET | Freq: Every day | ORAL | Status: DC
  Administered 2022-09-11 – 2022-09-14 (×4): 1 mg via ORAL
  Filled 2022-09-10 (×5): qty 1

## 2022-09-10 MED ORDER — ROCURONIUM BROMIDE 10 MG/ML (PF) SYRINGE
PREFILLED_SYRINGE | INTRAVENOUS | Status: DC | PRN
  Administered 2022-09-10 (×3): 10 mg via INTRAVENOUS
  Administered 2022-09-10: 80 mg via INTRAVENOUS
  Administered 2022-09-10 (×2): 10 mg via INTRAVENOUS
  Administered 2022-09-10: 20 mg via INTRAVENOUS

## 2022-09-10 MED ORDER — FENTANYL CITRATE PF 50 MCG/ML IJ SOSY
25.0000 ug | PREFILLED_SYRINGE | INTRAMUSCULAR | Status: DC | PRN
  Administered 2022-09-10 (×3): 50 ug via INTRAVENOUS

## 2022-09-10 MED ORDER — ENOXAPARIN SODIUM 40 MG/0.4ML IJ SOSY
40.0000 mg | PREFILLED_SYRINGE | Freq: Every day | INTRAMUSCULAR | Status: DC
  Administered 2022-09-11 – 2022-09-14 (×4): 40 mg via SUBCUTANEOUS
  Filled 2022-09-10 (×4): qty 0.4

## 2022-09-10 MED ORDER — ACETAMINOPHEN 500 MG PO TABS
1000.0000 mg | ORAL_TABLET | ORAL | Status: AC
  Administered 2022-09-10: 1000 mg via ORAL
  Filled 2022-09-10: qty 2

## 2022-09-10 MED ORDER — ACETAMINOPHEN 500 MG PO TABS: 1000.0000 mg | ORAL_TABLET | Freq: Once | ORAL | Status: DC

## 2022-09-10 MED ORDER — SUGAMMADEX SODIUM 200 MG/2ML IV SOLN
INTRAVENOUS | Status: DC | PRN
  Administered 2022-09-10: 150 mg via INTRAVENOUS

## 2022-09-10 MED ORDER — SCOPOLAMINE 1 MG/3DAYS TD PT72
1.0000 | MEDICATED_PATCH | TRANSDERMAL | Status: DC
  Administered 2022-09-10: 1.5 mg via TRANSDERMAL
  Filled 2022-09-10: qty 1

## 2022-09-10 MED ORDER — ONDANSETRON HCL 4 MG PO TABS: 4.0000 mg | ORAL_TABLET | Freq: Four times a day (QID) | ORAL | Status: DC | PRN

## 2022-09-10 MED ORDER — PROPOFOL 10 MG/ML IV BOLUS
INTRAVENOUS | Status: DC | PRN
  Administered 2022-09-10: 100 mg via INTRAVENOUS
  Administered 2022-09-10: 50 mg via INTRAVENOUS

## 2022-09-10 MED ORDER — ONDANSETRON HCL 4 MG/2ML IJ SOLN
INTRAMUSCULAR | Status: AC
  Filled 2022-09-10: qty 2

## 2022-09-10 MED ORDER — CHLORHEXIDINE GLUCONATE 0.12 % MT SOLN
15.0000 mL | Freq: Once | OROMUCOSAL | Status: AC
  Administered 2022-09-10: 15 mL via OROMUCOSAL

## 2022-09-10 MED ORDER — HEPARIN SODIUM (PORCINE) 5000 UNIT/ML IJ SOLN
5000.0000 [IU] | INTRAMUSCULAR | Status: AC
  Administered 2022-09-10: 5000 [IU] via SUBCUTANEOUS
  Filled 2022-09-10: qty 1

## 2022-09-10 MED ORDER — IBUPROFEN 600 MG PO TABS
600.0000 mg | ORAL_TABLET | Freq: Four times a day (QID) | ORAL | Status: DC
  Administered 2022-09-11 – 2022-09-14 (×9): 600 mg via ORAL
  Filled 2022-09-10: qty 1
  Filled 2022-09-10: qty 2
  Filled 2022-09-10 (×6): qty 1
  Filled 2022-09-10: qty 2
  Filled 2022-09-10 (×3): qty 1
  Filled 2022-09-10: qty 2
  Filled 2022-09-10: qty 1

## 2022-09-10 MED ORDER — KETAMINE HCL 50 MG/5ML IJ SOSY
PREFILLED_SYRINGE | INTRAMUSCULAR | Status: AC
  Filled 2022-09-10: qty 5

## 2022-09-10 MED ORDER — ENSURE ENLIVE PO LIQD
237.0000 mL | Freq: Two times a day (BID) | ORAL | Status: DC
  Administered 2022-09-11 – 2022-09-14 (×3): 237 mL via ORAL

## 2022-09-10 MED ORDER — ORAL CARE MOUTH RINSE: 15.0000 mL | Freq: Once | OROMUCOSAL | Status: AC

## 2022-09-10 MED ORDER — LORAZEPAM 2 MG/ML IJ SOLN: 1.0000 mg | INTRAMUSCULAR | Status: AC | PRN

## 2022-09-10 MED ORDER — BUPIVACAINE HCL 0.25 % IJ SOLN
INTRAMUSCULAR | Status: DC | PRN
  Administered 2022-09-10: 30 mL

## 2022-09-10 MED ORDER — DEXAMETHASONE SODIUM PHOSPHATE 4 MG/ML IJ SOLN
4.0000 mg | INTRAMUSCULAR | Status: AC
  Administered 2022-09-10: 8 mg via INTRAVENOUS

## 2022-09-10 MED ORDER — PROPOFOL 10 MG/ML IV BOLUS
INTRAVENOUS | Status: AC
  Filled 2022-09-10: qty 20

## 2022-09-10 MED ORDER — POVIDONE-IODINE 10 % EX SWAB
2.0000 | Freq: Once | CUTANEOUS | Status: AC
  Administered 2022-09-10: 2 via TOPICAL

## 2022-09-10 MED ORDER — PHENYLEPHRINE 80 MCG/ML (10ML) SYRINGE FOR IV PUSH (FOR BLOOD PRESSURE SUPPORT)
PREFILLED_SYRINGE | INTRAVENOUS | Status: DC | PRN
  Administered 2022-09-10 (×2): 80 ug via INTRAVENOUS

## 2022-09-10 MED ORDER — FENTANYL CITRATE (PF) 250 MCG/5ML IJ SOLN
INTRAMUSCULAR | Status: AC
  Filled 2022-09-10: qty 5

## 2022-09-10 MED ORDER — 0.9 % SODIUM CHLORIDE (POUR BTL) OPTIME
TOPICAL | Status: DC | PRN
  Administered 2022-09-10: 2000 mL

## 2022-09-10 MED ORDER — BUPIVACAINE HCL 0.25 % IJ SOLN
INTRAMUSCULAR | Status: AC
  Filled 2022-09-10: qty 1

## 2022-09-10 MED ORDER — SENNOSIDES-DOCUSATE SODIUM 8.6-50 MG PO TABS
2.0000 | ORAL_TABLET | Freq: Every day | ORAL | Status: DC
  Administered 2022-09-10 – 2022-09-12 (×3): 2 via ORAL
  Filled 2022-09-10 (×3): qty 2

## 2022-09-10 MED ORDER — MIDAZOLAM HCL 5 MG/5ML IJ SOLN
INTRAMUSCULAR | Status: DC | PRN
  Administered 2022-09-10: 2 mg via INTRAVENOUS

## 2022-09-10 MED ORDER — CEFAZOLIN SODIUM-DEXTROSE 2-4 GM/100ML-% IV SOLN
2.0000 g | INTRAVENOUS | Status: AC
  Administered 2022-09-10: 2 g via INTRAVENOUS
  Filled 2022-09-10: qty 100

## 2022-09-10 MED ORDER — NON FORMULARY: 1.0000 [IU] | Freq: Three times a day (TID) | Status: DC

## 2022-09-10 MED ORDER — ACETAMINOPHEN 500 MG PO TABS
1000.0000 mg | ORAL_TABLET | Freq: Four times a day (QID) | ORAL | Status: DC
  Administered 2022-09-10 – 2022-09-13 (×9): 1000 mg via ORAL
  Filled 2022-09-10 (×11): qty 2

## 2022-09-10 MED ORDER — LORAZEPAM 1 MG PO TABS
1.0000 mg | ORAL_TABLET | ORAL | Status: AC | PRN
  Administered 2022-09-12: 1 mg via ORAL
  Filled 2022-09-10: qty 1

## 2022-09-10 MED ORDER — ONDANSETRON HCL 4 MG/2ML IJ SOLN
4.0000 mg | Freq: Four times a day (QID) | INTRAMUSCULAR | Status: DC | PRN
  Administered 2022-09-10 – 2022-09-11 (×4): 4 mg via INTRAVENOUS
  Filled 2022-09-10 (×4): qty 2

## 2022-09-10 MED ORDER — PREGABALIN 75 MG PO CAPS
75.0000 mg | ORAL_CAPSULE | Freq: Two times a day (BID) | ORAL | Status: DC
  Administered 2022-09-11 – 2022-09-14 (×7): 75 mg via ORAL
  Filled 2022-09-10 (×7): qty 1

## 2022-09-10 MED ORDER — KETAMINE HCL 10 MG/ML IJ SOLN
INTRAMUSCULAR | Status: DC | PRN
  Administered 2022-09-10: 30 mg via INTRAVENOUS

## 2022-09-10 MED ORDER — SODIUM CHLORIDE (PF) 0.9 % IJ SOLN
INTRAMUSCULAR | Status: AC
  Filled 2022-09-10: qty 50

## 2022-09-10 MED ORDER — GABAPENTIN 300 MG PO CAPS
300.0000 mg | ORAL_CAPSULE | ORAL | Status: AC
  Administered 2022-09-10: 300 mg via ORAL
  Filled 2022-09-10: qty 1

## 2022-09-10 MED ORDER — THIAMINE HCL 100 MG/ML IJ SOLN
100.0000 mg | Freq: Every day | INTRAMUSCULAR | Status: DC
  Administered 2022-09-10: 100 mg via INTRAVENOUS
  Filled 2022-09-10: qty 2

## 2022-09-10 MED ORDER — AMISULPRIDE (ANTIEMETIC) 5 MG/2ML IV SOLN: 10.0000 mg | Freq: Once | INTRAVENOUS | Status: DC | PRN

## 2022-09-10 MED ORDER — OXYCODONE HCL 5 MG PO TABS: 5.0000 mg | ORAL_TABLET | ORAL | Status: DC | PRN

## 2022-09-10 MED ORDER — BUPIVACAINE LIPOSOME 1.3 % IJ SUSP
INTRAMUSCULAR | Status: DC | PRN
  Administered 2022-09-10: 20 mL

## 2022-09-10 MED ORDER — LIDOCAINE 2% (20 MG/ML) 5 ML SYRINGE
INTRAMUSCULAR | Status: DC | PRN
  Administered 2022-09-10: 1.5 mg/kg/h via INTRAVENOUS
  Administered 2022-09-10: 60 mg via INTRAVENOUS

## 2022-09-10 MED ORDER — MIDAZOLAM HCL 2 MG/2ML IJ SOLN
INTRAMUSCULAR | Status: AC
  Filled 2022-09-10: qty 2

## 2022-09-10 MED ORDER — ONDANSETRON HCL 4 MG/2ML IJ SOLN
INTRAMUSCULAR | Status: DC | PRN
  Administered 2022-09-10: 4 mg via INTRAVENOUS

## 2022-09-10 MED ORDER — KCL IN DEXTROSE-NACL 20-5-0.45 MEQ/L-%-% IV SOLN
INTRAVENOUS | Status: DC
  Filled 2022-09-10 (×3): qty 1000

## 2022-09-10 MED ORDER — LACTATED RINGERS IV SOLN: INTRAVENOUS | Status: DC

## 2022-09-10 MED ORDER — FENTANYL CITRATE (PF) 100 MCG/2ML IJ SOLN
INTRAMUSCULAR | Status: DC | PRN
  Administered 2022-09-10 (×3): 50 ug via INTRAVENOUS
  Administered 2022-09-10: 100 ug via INTRAVENOUS
  Administered 2022-09-10: 50 ug via INTRAVENOUS
  Administered 2022-09-10: 100 ug via INTRAVENOUS
  Administered 2022-09-10: 50 ug via INTRAVENOUS

## 2022-09-10 MED ORDER — SODIUM CHLORIDE (PF) 0.9 % IJ SOLN
INTRAMUSCULAR | Status: DC | PRN
  Administered 2022-09-10: 50 mL

## 2022-09-10 MED ORDER — ADULT MULTIVITAMIN W/MINERALS CH
1.0000 | ORAL_TABLET | Freq: Every day | ORAL | Status: DC
  Administered 2022-09-11 – 2022-09-14 (×4): 1 via ORAL
  Filled 2022-09-10 (×5): qty 1

## 2022-09-10 MED ORDER — LABETALOL HCL 5 MG/ML IV SOLN
INTRAVENOUS | Status: AC
  Filled 2022-09-10: qty 4

## 2022-09-10 SURGICAL SUPPLY — 80 items
ADH SKN CLS APL DERMABOND .7 (GAUZE/BANDAGES/DRESSINGS) ×2
AGENT HMST KT MTR STRL THRMB (HEMOSTASIS)
APL PRP STRL LF DISP 70% ISPRP (MISCELLANEOUS) ×1
ATTRACTOMAT 16X20 MAGNETIC DRP (DRAPES) IMPLANT
BAG COUNTER SPONGE SURGICOUNT (BAG) IMPLANT
BAG SPNG CNTER NS LX DISP (BAG)
BINDER ABDOMINAL 12 ML 46-62 (SOFTGOODS) IMPLANT
BLADE EXTENDED COATED 6.5IN (ELECTRODE) ×2 IMPLANT
CELLS DAT CNTRL 66122 CELL SVR (MISCELLANEOUS) IMPLANT
CHLORAPREP W/TINT 26 (MISCELLANEOUS) ×2 IMPLANT
CLIP TI LARGE 6 (CLIP) ×2 IMPLANT
CLIP TI MEDIUM 6 (CLIP) ×2 IMPLANT
CLIP TI MEDIUM LARGE 6 (CLIP) ×2 IMPLANT
CNTNR URN SCR LID CUP LEK RST (MISCELLANEOUS) IMPLANT
CONT SPEC 4OZ STRL OR WHT (MISCELLANEOUS)
DERMABOND ADVANCED .7 DNX12 (GAUZE/BANDAGES/DRESSINGS) IMPLANT
DRAPE INCISE IOBAN 66X45 STRL (DRAPES) IMPLANT
DRAPE SHEET LG 3/4 BI-LAMINATE (DRAPES) IMPLANT
DRAPE SURG IRRIG POUCH 19X23 (DRAPES) ×2 IMPLANT
DRAPE WARM FLUID 44X44 (DRAPES) ×2 IMPLANT
DRSG OPSITE POSTOP 4X10 (GAUZE/BANDAGES/DRESSINGS) IMPLANT
DRSG OPSITE POSTOP 4X6 (GAUZE/BANDAGES/DRESSINGS) IMPLANT
DRSG OPSITE POSTOP 4X8 (GAUZE/BANDAGES/DRESSINGS) IMPLANT
ELECT REM PT RETURN 15FT ADLT (MISCELLANEOUS) ×2 IMPLANT
GAUZE 4X4 16PLY ~~LOC~~+RFID DBL (SPONGE) IMPLANT
GLOVE BIO SURGEON STRL SZ 6 (GLOVE) ×4 IMPLANT
GLOVE BIO SURGEON STRL SZ 6.5 (GLOVE) ×4 IMPLANT
GOWN STRL REUS W/ TWL LRG LVL3 (GOWN DISPOSABLE) ×4 IMPLANT
GOWN STRL REUS W/TWL LRG LVL3 (GOWN DISPOSABLE) ×2
HEMOSTAT ARISTA ABSORB 3G PWDR (HEMOSTASIS) IMPLANT
KIT BASIN OR (CUSTOM PROCEDURE TRAY) ×2 IMPLANT
KIT TURNOVER KIT A (KITS) IMPLANT
LIGASURE IMPACT 36 18CM CVD LR (INSTRUMENTS) IMPLANT
LOOP VESSEL MAXI BLUE (MISCELLANEOUS) IMPLANT
NDL HYPO 21X1.5 SAFETY (NEEDLE) ×4 IMPLANT
NEEDLE HYPO 21X1.5 SAFETY (NEEDLE) ×2 IMPLANT
NS IRRIG 1000ML POUR BTL (IV SOLUTION) ×4 IMPLANT
PACK GENERAL/GYN (CUSTOM PROCEDURE TRAY) ×2 IMPLANT
RELOAD PROXIMATE 75MM BLUE (ENDOMECHANICALS) IMPLANT
RELOAD PROXIMATE TA60MM BLUE (ENDOMECHANICALS) IMPLANT
RELOAD STAPLE 60 BLU REG PROX (ENDOMECHANICALS) IMPLANT
RELOAD STAPLE 75 3.8 BLU REG (ENDOMECHANICALS) IMPLANT
RETRACTOR WND ALEXIS 18 MED (MISCELLANEOUS) IMPLANT
RETRACTOR WND ALEXIS 25 LRG (MISCELLANEOUS) IMPLANT
RTRCTR WOUND ALEXIS 18CM MED (MISCELLANEOUS)
RTRCTR WOUND ALEXIS 25CM LRG (MISCELLANEOUS) ×1
SHEET LAVH (DRAPES) ×2 IMPLANT
SLEEVE SUCTION CATH 165 (SLEEVE) ×2 IMPLANT
SOL PREP POV-IOD 4OZ 10% (MISCELLANEOUS) ×2 IMPLANT
SPONGE T-LAP 18X18 ~~LOC~~+RFID (SPONGE) IMPLANT
STAPLER GUN LINEAR PROX 60 (STAPLE) IMPLANT
STAPLER PROXIMATE 75MM BLUE (STAPLE) IMPLANT
STAPLER VISISTAT 35W (STAPLE) IMPLANT
SURGIFLO W/THROMBIN 8M KIT (HEMOSTASIS) IMPLANT
SUT MNCRL AB 4-0 PS2 18 (SUTURE) ×4 IMPLANT
SUT PDS AB 1 CTX 36 (SUTURE) IMPLANT
SUT PDS AB 1 TP1 96 (SUTURE) ×4 IMPLANT
SUT SILK 0 (SUTURE) ×1
SUT SILK 0 30XBRD TIE 6 (SUTURE) IMPLANT
SUT SILK 3 0 SH CR/8 (SUTURE) IMPLANT
SUT VIC AB 0 BRD 54 (SUTURE) IMPLANT
SUT VIC AB 0 CT1 27 (SUTURE) ×2
SUT VIC AB 0 CT1 27XBRD ANTBC (SUTURE) IMPLANT
SUT VIC AB 0 CT1 36 (SUTURE) ×8 IMPLANT
SUT VIC AB 2-0 CT1 27 (SUTURE)
SUT VIC AB 2-0 CT1 36 (SUTURE) ×4 IMPLANT
SUT VIC AB 2-0 CT1 TAPERPNT 27 (SUTURE) ×4 IMPLANT
SUT VIC AB 2-0 CT2 27 (SUTURE) ×12 IMPLANT
SUT VIC AB 2-0 SH 27 (SUTURE) ×2
SUT VIC AB 2-0 SH 27X BRD (SUTURE) IMPLANT
SUT VIC AB 3-0 CTX 36 (SUTURE) IMPLANT
SUT VIC AB 3-0 SH 18 (SUTURE) IMPLANT
SUT VIC AB 3-0 SH 27 (SUTURE) ×15
SUT VIC AB 3-0 SH 27X BRD (SUTURE) ×2 IMPLANT
SYR 30ML LL (SYRINGE) ×4 IMPLANT
TOWEL OR 17X26 10 PK STRL BLUE (TOWEL DISPOSABLE) ×2 IMPLANT
TOWEL OR NON WOVEN STRL DISP B (DISPOSABLE) ×2 IMPLANT
TRAY FOLEY MTR SLVR 14FR STAT (SET/KITS/TRAYS/PACK) IMPLANT
TRAY FOLEY MTR SLVR 16FR STAT (SET/KITS/TRAYS/PACK) ×2 IMPLANT
UNDERPAD 30X36 HEAVY ABSORB (UNDERPADS AND DIAPERS) ×2 IMPLANT

## 2022-09-10 NOTE — Anesthesia Procedure Notes (Signed)
Procedure Name: Intubation Date/Time: 09/10/2022 7:43 AM  Performed by: Gean Maidens, CRNAPre-anesthesia Checklist: Patient identified, Emergency Drugs available, Suction available, Patient being monitored and Timeout performed Patient Re-evaluated:Patient Re-evaluated prior to induction Oxygen Delivery Method: Circle system utilized Preoxygenation: Pre-oxygenation with 100% oxygen Induction Type: IV induction Ventilation: Mask ventilation without difficulty Laryngoscope Size: Mac and 3 Grade View: Grade II Tube type: Oral Tube size: 7.0 mm Number of attempts: 2 Airway Equipment and Method: Stylet Placement Confirmation: ETT inserted through vocal cords under direct vision, positive ETCO2 and breath sounds checked- equal and bilateral Secured at: 21 cm Tube secured with: Tape Dental Injury: Teeth and Oropharynx as per pre-operative assessment

## 2022-09-10 NOTE — Op Note (Addendum)
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 No vaginal involvement and no parametrial nodularity. Small uterus with several  subserosal fibroids. Normal appearing bilateral fallopian tubes and ovaries. Portion of distal omentum adherent to the posterior cul-de-sac peritoneum overlying the posterior cervix. Physiologic adhesions of the sigmoid colon to the left pelvic sidewall. No enlarged pelvic lymph nodes. Normal upper abdominal survey with smooth liver surface and diaphragm. Normal appearing small and large bowel and omentum. Normal appearing appendix.   Specimens:  ID Type Source Tests Collected by Time Destination  1 : Right Pelvic Lymph Node Tissue PATH Lymph node excision SURGICAL PATHOLOGY Bernadene Bell, MD 09/10/2022 0909   2 : Left Pelvic Lymph Node Tissue PATH Lymph node excision SURGICAL PATHOLOGY Bernadene Bell, MD 09/10/2022 0941   3 : Adherent Omentum Tissue PATH GI benign resection SURGICAL PATHOLOGY Bernadene Bell, MD 09/10/2022 1120   4 : Uterus, Cervix, Bilateral Fallopian Tubes, with upper 2cm of vagina Tissue PATH Gyn tumor resection SURGICAL PATHOLOGY Bernadene Bell, MD 65/01/5464 [id number]     Complications:  None  Indications for Procedure: Sarah Morris is a 41 y.o. 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  Prior to the procedure, all risks, benefits, and alternatives were discussed and informed surgical consent was signed.  Procedure: Patient was taken to the operating room where general anesthesia was achieved.  She was positioned in dorsal lithotomy and prepped and draped in sterile fashion.  A Foley catheter was inserted into the bladder. A vertical midline incision was made with the scalpel and the abdomen was entered sharply.  The abdomen and pelvis were surveyed with findings as documented above. A wound protector was placed followed by the Bookwalter retractor. The bowel was packed using moist laparotomy sponges and retracted out of the pelvis.   The right round ligament was transected. The right ureter was identified. The paravesical and pararectal spaces were opened using a combination of cautery and blunt dissection. A right pelvic lymphadenectomy was performed using the following boundaries: the bifurcation of the common iliac vessels cephalad, the deep circumflex vein caudad, the genitofemoral nerve laterally, the superior vesical artery medially, and the obturator nerve at the floor of the dissection which was visualized and spared throughout the dissection bilaterally. A left pelvic lymphadenectomy was performed using the same boundaries.   The right ureter was again identified and isolated with a vessel loop.  Ureterolysis was performed to the level of the origin of the uterine artery. The bladder was mobilized caudally off the anterior vagina. The uterine artery was skeletonized, clipped and tied with silk, and transected at its origin and reflected medially, along with the associated parametrial tissue. The underlying ureter was carefully dissected off and ureterolysis was performed to the level of its insertion into the bladder. Care was taken to ensure that the ureters remained uncompromised and retracted out of the field of dissection throughout the  procedure. The parametrium was dissected  and mobilized with serial clamps, transection, and suture ligation of the anterior vesicouterine ligament. A similar procedure was performed on the left side.  The fallopian tubes were bilaterally skeletonized and mobilized en bloc with the uterine specimen using the Ligasure device. The peritoneal incision on the medial leaf of the broad ligament was extended toward the uterosacral ligaments. The rectovaginal space was opened and the hypogastric nerves as well as the rectum were carefully dissected off the posterior vagina. The uterosacral ligaments were cauterized and transected with the Ligasure device at the level of the rectum. Two curved clamps were placed below the cervix, and the uterus, cervix, and upper 2cm of the vagina were amputated from the distal vagina. The vaginal cuff was closed with two Heaney stitches at the apices and several figure-of-eight stitches of 0-Vicryl in the midline.  The pelvis was irrigated and all operative sites were found to be hemostatic. The fascia was closed with two running stitches of #1 PDS, tied in the midline. Exparel was injected at the incision site in standard fashion. The subcutaneous space was approximated with 2-0 Vicryl running stitches following by 3-0 deep dermal running stitches. The skin was closed with 4-0 monocryl in a subcuticular fashion followed by surgical glue.  Patient tolerated the procedure well. Sponge, lap, and instrument counts were correct.  Patient received 2 gm of Ancef prior to skin incision for routine perioperative antibiotic prophylaxis.  She was extubated and taken to the PACU in stable condition.  Bernadene Bell, MD Gynecologic Oncology

## 2022-09-10 NOTE — Transfer of Care (Signed)
Immediate Anesthesia Transfer of Care Note  Patient: Sarah Morris  Procedure(s) Performed: OPEN RADICAL HYSTERECTOMY OPEN BILATERAL SALPINGECTOMY PELVIC LYMPH NODE DISSECTION  Patient Location: PACU  Anesthesia Type:General  Level of Consciousness: sedated, patient cooperative and responds to stimulation  Airway & Oxygen Therapy: Patient Spontanous Breathing and Patient connected to face mask oxygen  Post-op Assessment: Report given to RN and Post -op Vital signs reviewed and stable  Post vital signs: Reviewed and stable  Last Vitals:  Vitals Value Taken Time  BP 144/102 09/10/22 1408  Temp    Pulse 54 09/10/22 1411  Resp 11 09/10/22 1411  SpO2 100 % 09/10/22 1411  Vitals shown include unvalidated device data.  Last Pain:  Vitals:   09/10/22 0550  TempSrc: Oral  PainSc: 0-No pain         Complications: No notable events documented.

## 2022-09-10 NOTE — Anesthesia Preprocedure Evaluation (Signed)
Anesthesia Evaluation  Patient identified by MRN, date of birth, ID band Patient awake    Reviewed: Allergy & Precautions, NPO status , Patient's Chart, lab work & pertinent test results  Airway Mallampati: II  TM Distance: >3 FB Neck ROM: Full    Dental  (+) Dental Advisory Given   Pulmonary Current Smoker and Patient abstained from smoking.,    breath sounds clear to auscultation       Cardiovascular negative cardio ROS   Rhythm:Regular Rate:Normal     Neuro/Psych negative neurological ROS     GI/Hepatic negative GI ROS, Neg liver ROS,   Endo/Other  negative endocrine ROS  Renal/GU negative Renal ROS     Musculoskeletal   Abdominal   Peds  Hematology negative hematology ROS (+)   Anesthesia Other Findings   Reproductive/Obstetrics                             Lab Results  Component Value Date   WBC 7.2 08/28/2022   HGB 14.2 08/28/2022   HCT 42.9 08/28/2022   MCV 104.4 (H) 08/28/2022   PLT 300 08/28/2022   Lab Results  Component Value Date   CREATININE 0.77 08/28/2022   BUN 11 08/28/2022   NA 137 08/28/2022   K 3.9 08/28/2022   CL 107 08/28/2022   CO2 23 08/28/2022    Anesthesia Physical Anesthesia Plan  ASA: 2  Anesthesia Plan: General   Post-op Pain Management: Tylenol PO (pre-op)*, Gabapentin PO (pre-op)*, Ketamine IV*, Lidocaine infusion* and Toradol IV (intra-op)*   Induction: Intravenous  PONV Risk Score and Plan: 4 or greater and Dexamethasone, Ondansetron, Midazolam and Scopolamine patch - Pre-op  Airway Management Planned: Oral ETT  Additional Equipment: None  Intra-op Plan:   Post-operative Plan: Extubation in OR  Informed Consent: I have reviewed the patients History and Physical, chart, labs and discussed the procedure including the risks, benefits and alternatives for the proposed anesthesia with the patient or authorized representative who has  indicated his/her understanding and acceptance.     Dental advisory given  Plan Discussed with: CRNA  Anesthesia Plan Comments:         Anesthesia Quick Evaluation

## 2022-09-10 NOTE — Interval H&P Note (Signed)
History and Physical Interval Note:  09/10/2022 7:16 AM  Sarah Morris  has presented today for surgery, with the diagnosis of CERVICAL CANCER.  The various methods of treatment have been discussed with the patient and family. After consideration of risks, benefits and other options for treatment, the patient has consented to  Procedure(s): OPEN RADICAL HYSTERECTOMY (N/A) OPEN BILATERAL SALPINGECTOMY (N/A) PELVIC LYMPH NODE DISSECTION (N/A) as a surgical intervention.  The patient's history has been reviewed, patient examined, no change in status, stable for surgery.  I have reviewed the patient's chart and labs.  Questions were answered to the patient's satisfaction.     [name]

## 2022-09-11 ENCOUNTER — Encounter (HOSPITAL_COMMUNITY): Payer: Self-pay | Admitting: Psychiatry

## 2022-09-11 LAB — BASIC METABOLIC PANEL
Anion gap: 8 (ref 5–15)
BUN: 6 mg/dL (ref 6–20)
CO2: 22 mmol/L (ref 22–32)
Calcium: 8.7 mg/dL — ABNORMAL LOW (ref 8.9–10.3)
Chloride: 106 mmol/L (ref 98–111)
Creatinine, Ser: 0.69 mg/dL (ref 0.44–1.00)
GFR, Estimated: >60 mL/min (ref >60)
Glucose, Bld: 153 mg/dL — ABNORMAL HIGH (ref 70–99)
Potassium: 4.5 mmol/L (ref 3.5–5.1)
Sodium: 136 mmol/L (ref 135–145)

## 2022-09-11 LAB — CBC
HCT: 34.4 % — ABNORMAL LOW (ref 36.0–46.0)
Hemoglobin: 11.4 g/dL — ABNORMAL LOW (ref 12.0–15.0)
MCH: 35 pg — ABNORMAL HIGH (ref 26.0–34.0)
MCHC: 33.1 g/dL (ref 30.0–36.0)
MCV: 105.5 fL — ABNORMAL HIGH (ref 80.0–100.0)
Platelets: 211 10*3/uL (ref 150–400)
RBC: 3.26 MIL/uL — ABNORMAL LOW (ref 3.87–5.11)
RDW: 12.2 % (ref 11.5–15.5)
WBC: 11 10*3/uL — ABNORMAL HIGH (ref 4.0–10.5)
nRBC: 0 % (ref 0.0–0.2)

## 2022-09-11 MED ORDER — HYDROMORPHONE HCL 1 MG/ML IJ SOLN
0.5000 mg | INTRAMUSCULAR | Status: DC | PRN
  Administered 2022-09-11 – 2022-09-12 (×2): 0.5 mg via INTRAVENOUS
  Filled 2022-09-11 (×2): qty 0.5

## 2022-09-11 MED ORDER — ALBUTEROL SULFATE (2.5 MG/3ML) 0.083% IN NEBU: 22.5000 mg | INHALATION_SOLUTION | RESPIRATORY_TRACT | Status: DC | PRN

## 2022-09-11 MED ORDER — OXYCODONE HCL 5 MG PO TABS
5.0000 mg | ORAL_TABLET | ORAL | Status: DC | PRN
  Administered 2022-09-11 – 2022-09-13 (×7): 5 mg via ORAL
  Filled 2022-09-11 (×7): qty 1

## 2022-09-11 MED ORDER — ALBUTEROL SULFATE (2.5 MG/3ML) 0.083% IN NEBU
2.5000 mg | INHALATION_SOLUTION | Freq: Once | RESPIRATORY_TRACT | Status: AC
  Administered 2022-09-11: 2.5 mg via RESPIRATORY_TRACT
  Filled 2022-09-11: qty 3

## 2022-09-11 NOTE — Progress Notes (Signed)
1 Day Post-Op Procedure(s) (LRB): OPEN RADICAL HYSTERECTOMY (N/A) OPEN BILATERAL SALPINGECTOMY (N/A) PELVIC LYMPH NODE DISSECTION (N/A)  Subjective: Patient reports that she was able to tolerate some chicken broth overnight. Her nausea has resolved. She was able to stand at the side of the bed without assistance. Her pain is well controlled with tylenol, IV toradol and IV dilaudid. She reports that she has passed some gas and is burping. Notes some light spotting on pad. Reports sorness/ swelling of fee.    Objective: Vital signs in last 24 hours: Temp:  [97.4 F (36.3 C)-98.9 F (37.2 C)] 98.9 F (37.2 C) (10/25 0552) Pulse Rate:  [62-87] 75 (10/25 0552) Resp:  [10-18] 18 (10/25 0552) BP: (106-144)/(57-104) 116/77 (10/25 0552) SpO2:  [95 %-100 %] 100 % (10/25 0552) Last BM Date : 09/10/22  Intake/Output from previous day: 10/24 0701 - 10/25 0700 In: 5068.6 [P.O.:1080; I.V.:3988.6] Out: 2550 [Urine:2300; Blood:250]  Physical Examination: General: alert and no distress Resp: rhonchi base - bilateral and wheezes base - bilaterally Cardio: regular rate and rhythm, S1, S2 normal, no murmur, click, rub or gallop GI: incision: clean, dry, intact, and honeycomb dressing in place and abdominal binder and slightly decreased bowel sounds but present. Appropriately mildly tender to palpation, soft Extremities: extremities normal, atraumatic, no cyanosis or edema and SCDs in place  Labs: WBC/Hgb/Hct/Plts:  11.0/11.4/34.4/211 (10/25 0429) BUN/Cr/glu/ALT/AST/amyl/lip:  6/0.69/--/--/--/--/-- (10/25 6599)  Assessment/Plan: 41 y.o. s/p Procedure(s): OPEN RADICAL HYSTERECTOMY OPEN BILATERAL SALPINGECTOMY PELVIC LYMPH NODE DISSECTION:  recovering appropriately well on postop day 1.  Pain:  Pain is well-controlled on PRN medications.  - Plan: Will transition today to PO motrin (from IV toradol) and will work towards using PO oxy and only IV dilaudid for breakthrough pain not managed by PO  meds.  Heme: Acute blood loss anemia, mild. Appropriate drop. Normal vitals and excellent urine output. No concern for bleeding.  Pulm: Ronchi and scattered wheezing on exam. Pt satting 100% on 2L.  - Plan: Will work on weaning to room air. Will give 1x dose of albuterol and have available PRN for wheezing and SOB.     FENGI:  Tolerating clears. Has appetite and ordered breakfast.  - Plan: Continued to advance as tolerated.  GU: Foley in place draining clear yellow urine.  - Plan: Continue with foley in place until POD#7   Postop: Progressing appropriately. Reported sore/swollen feet. No abnormalities on exam. - Plan: ambulate today. Will reassess if ongoing concerns for feet.  Prophylaxis: pharmacologic prophylaxis (with any of the following: Lovenox '40mg'$  at noon and then daily) and intermittent pneumatic compression boots.  Dispo:  Discharge to home when meeting all postoperative goals.   LOS: 1 day    Aryan Bello 09/11/2022, 8:33 AM

## 2022-09-11 NOTE — Anesthesia Postprocedure Evaluation (Signed)
Anesthesia Post Note  Patient: Sarah Morris  Procedure(s) Performed: OPEN RADICAL HYSTERECTOMY OPEN BILATERAL SALPINGECTOMY PELVIC LYMPH NODE DISSECTION     Patient location during evaluation: PACU Anesthesia Type: General Level of consciousness: awake and alert Pain management: pain level controlled Vital Signs Assessment: post-procedure vital signs reviewed and stable Respiratory status: spontaneous breathing, nonlabored ventilation, respiratory function stable and patient connected to nasal cannula oxygen Cardiovascular status: blood pressure returned to baseline and stable Postop Assessment: no apparent nausea or vomiting Anesthetic complications: no   No notable events documented.  Last Vitals:  Vitals:   09/11/22 0936 09/11/22 1402  BP: 120/80 117/84  Pulse: (!) 56 63  Resp: 18 18  Temp: 37.1 C 37.1 C  SpO2: 100% 100%    Last Pain:  Vitals:   09/11/22 1402  TempSrc: Oral  PainSc:                  Tiajuana Amass

## 2022-09-11 NOTE — TOC Progression Note (Signed)
Transition of Care [facility]) - Progression Note    Patient Details  Name: Sarah Morris MRN: [id number] Date of Birth: 12-Mar-1981  Transition of [facility]) CM/SW Contact  Purcell Mouton, RN Phone Number: 09/11/2022, 9:07 AM  Clinical Narrative:     Spoke with pt concerning Substance abuse. Pt states that she is not a substance abuser. Explained to pt that SA information was placed on her discharge information. Pt states okay.    Expected Discharge Plan: Home/Self Care Barriers to Discharge: No Barriers Identified  Expected Discharge Plan and Services Expected Discharge Plan: Home/Self Care       Living arrangements for the past 2 months: Single Family Home                                       Social Determinants of Health (SDOH) Interventions    Readmission Risk Interventions     No data to display

## 2022-09-12 MED ORDER — CHLORHEXIDINE GLUCONATE CLOTH 2 % EX PADS
6.0000 | MEDICATED_PAD | Freq: Every day | CUTANEOUS | Status: DC
  Administered 2022-09-12 – 2022-09-14 (×3): 6 via TOPICAL

## 2022-09-12 NOTE — Plan of Care (Signed)
  Problem: Education: Goal: Knowledge of General Education information will improve Description Including pain rating scale, medication(s)/side effects and non-pharmacologic comfort measures Outcome: Progressing   

## 2022-09-12 NOTE — Progress Notes (Signed)
2 Days Post-Op Procedure(s) (LRB): OPEN RADICAL HYSTERECTOMY (N/A) OPEN BILATERAL SALPINGECTOMY (N/A) PELVIC LYMPH NODE DISSECTION (N/A)  Subjective: Patient reports not taking in adequate food intake. Had nausea which she relates to anesthesia. Little desire to eat but she is trying. She is ambulating without difficulty. She reports passing flatus with no bowel movement. Reports sorness/ swelling of feet. Having cough and feels she might have a cold. Abdominal soreness reported with adequate relief from po pain medications. All questions answered. Dr. Berline Lopes at the bedside.  Objective: Vital signs in last 24 hours: Temp:  [98.1 F (36.7 C)-98.9 F (37.2 C)] 98.1 F (36.7 C) (10/26 0601) Pulse Rate:  [68-89] 82 (10/26 1320) Resp:  [16-18] 16 (10/26 1320) BP: (125-135)/(73-107) 135/107 (10/26 1320) SpO2:  [97 %-100 %] 100 % (10/26 1320) Last BM Date : 09/09/22  Intake/Output from previous day: 10/25 0701 - 10/26 0700 In: 2490 [P.O.:720; I.V.:1770] Out: 1900 [Urine:1900]  Physical Examination: General: alert, cooperative, and no distress Resp: rhonchi base - bilateral and wheezes base - bilaterally and in upper lobes Cardio: regular rate and rhythm, S1, S2 normal, no murmur, click, rub or gallop GI: incision: clean, dry, intact, and honeycomb dressing in place and abdominal binder and hypoactive bowel sounds. Appropriately mildly tender to palpation, soft Extremities: extremities normal, atraumatic, no cyanosis or edema and SCDs in place Foley in place with clear, yellow urine.  Labs: From 10/25  Assessment/Plan: 41 y.o. s/p Procedure(s): OPEN RADICAL HYSTERECTOMY, OPEN BILATERAL SALPINGECTOMY. PELVIC LYMPH NODE DISSECTION:  recovering appropriately well on postop day 2. Decreased po intake of solid foods.  Pain:  Pain is well-controlled on PRN medications.  - Plan: Continue PO motrin and oral pain medications with only IV dilaudid for breakthrough pain not managed by PO  meds.  Heme: Acute blood loss anemia, mild. Appropriate drop. Normal vitals and excellent urine output. No concern for bleeding.  Pulm: Ronchi and scattered wheezing on exam. O2 sat 100 on RA.  - Plan: PRN albuterol for wheezing and SOB. Encouraged IS.  FENGI:  Tolerating liquids. Discussed dietary choices. Reporting decreased appetite.  - Plan: Diet as tolerated.  GU: Foley in place draining clear yellow urine.  - Plan: Continue with foley in place until POD#7   Postop: Progressing appropriately. Reported sore abdomen, sore/swollen feet. No abnormalities on exam. - Plan: Kpad, encourage increasing activity  Prophylaxis: pharmacologic prophylaxis (with any of the following: Lovenox '40mg'$  daily) and intermittent pneumatic compression boots.  Dispo:  Discharge to home when meeting all postoperative goals. Continue plan of care per Dr. Ernestina Patches and Dr. Berline Lopes. Plan for saline lock of IVF. Diet as tolerated. Kpad to the room.   LOS: 2 days    Sarah Morris 09/12/2022, 4:25 PM

## 2022-09-13 MED ORDER — SENNOSIDES-DOCUSATE SODIUM 8.6-50 MG PO TABS: 2.0000 | ORAL_TABLET | Freq: Every day | ORAL | 0 refills | Status: DC

## 2022-09-13 MED ORDER — PHENYLEPHRINE HCL 10 MG PO TABS: 10.0000 mg | ORAL_TABLET | ORAL | Status: DC | PRN

## 2022-09-13 MED ORDER — PSEUDOEPHEDRINE HCL 60 MG PO TABS: 60.0000 mg | ORAL_TABLET | Freq: Four times a day (QID) | ORAL | Status: DC | PRN

## 2022-09-13 MED ORDER — OXYCODONE HCL 5 MG PO TABS: 5.0000 mg | ORAL_TABLET | ORAL | 0 refills | Status: DC | PRN

## 2022-09-13 MED ORDER — IBUPROFEN 800 MG PO TABS: 800.0000 mg | ORAL_TABLET | Freq: Three times a day (TID) | ORAL | 0 refills | Status: DC | PRN

## 2022-09-13 NOTE — Progress Notes (Signed)
3 Days Post-Op Procedure(s) (LRB): OPEN RADICAL HYSTERECTOMY (N/A) OPEN BILATERAL SALPINGECTOMY (N/A) PELVIC LYMPH NODE DISSECTION (N/A)  Subjective: Patient reports having 5 bowel movements overnight with abdominal cramping. She feels the diarrhea has been a set back for her healing wise with increased abdominal discomfort. She states her body tells her to eat but when the food arrives she can't eat. Has been taking antiemetics.  She is ambulating without difficulty and sitting up in the chair.  Reports sorness/ swelling of feet has improved this am. Having congestion and has been getting to 700 on IS. Abdominal soreness reported with adequate relief from po pain medications. Patient tearful at times during the visit stating she doesn't like to not be in control. All questions answered. Dr. Berline Lopes at the bedside.  Objective: Vital signs in last 24 hours: Temp:  [98.2 F (36.8 C)-98.9 F (37.2 C)] 98.2 F (36.8 C) (10/27 0549) Pulse Rate:  [79-82] 79 (10/27 0549) Resp:  [15-16] 15 (10/27 0549) BP: (109-135)/(72-107) 109/72 (10/27 0549) SpO2:  [100 %] 100 % (10/27 0549) Last BM Date : 09/12/22  Intake/Output from previous day: 10/26 0701 - 10/27 0700 In: 2265.4 [P.O.:1200; I.V.:1065.4] Out: 4200 [Urine:4200]  Physical Examination: General: alert, cooperative, no distress, and tearful at times Resp: improved from 10/26 assessment. Scattered wheezes in the bases bilaterally, more on the left Cardio: regular rate and rhythm, S1, S2 normal, no murmur, click, rub or gallop GI: incision: clean, dry, intact, and honeycomb dressing in place and abdominal binder and active bowel sounds. Appropriately mildly tender to palpation, soft Extremities: extremities normal, atraumatic, no cyanosis or edema and SCDs in place Foley in place with clear, yellow urine.  Labs: From 10/25  Assessment/Plan: 41 y.o. s/p Procedure(s): OPEN RADICAL HYSTERECTOMY, OPEN BILATERAL SALPINGECTOMY. PELVIC LYMPH  NODE DISSECTION:  recovering appropriately well on postop day 3. Continued decreased po intake of solid foods, had loose BMs overnight causing abdominal cramping  Pain:  Pain is well-controlled on PRN medications.  - Plan: Continue PO motrin and oral pain medications with only IV dilaudid for breakthrough pain not managed by PO meds.  Heme: Acute blood loss anemia, mild. Appropriate drop. Normal vitals and excellent urine output. No concern for bleeding.  Pulm: Ronchi and scattered wheezing on exam-improved from 10/26 assessment. O2 sat 100 on RA.  - Plan: Sudafed ordered this am. Respiratory therapy consult placed 10/26. PRN albuterol for wheezing and SOB. Encouraged IS.  FENGI:  Tolerating liquids. Continued decrease in solid food intake.  - Plan: Diet as tolerated. Discussed dietary choices. Small meals more frequently.  GU: Foley in place draining clear yellow urine.  - Plan: Continue with foley in place until POD#7. Appt made for follow up in the office on 11/1.   Postop: Progressing appropriately. Reported sore abdomen, sore/swollen feet improved, loose stools overnight from laxative use. No abnormalities on exam. - Plan: Kpad, encourage increasing activity  Prophylaxis: pharmacologic prophylaxis (with any of the following: Lovenox '40mg'$  daily) and intermittent pneumatic compression boots.  Dispo:  Discharge to home when meeting all postoperative goals. Continue plan of care per Dr. Ernestina Patches and Dr. Berline Lopes. Plan for possible discharge later today if patient doing better. If not, possibly tomorrow.    LOS: 3 days    Dorothyann Gibbs 09/13/2022, 7:50 AM

## 2022-09-13 NOTE — Discharge Instructions (Addendum)
AFTER SURGERY INSTRUCTIONS   Return to work: 4-6 weeks if applicable   You will have a white honeycomb dressing over your larger incision. This dressing can be removed 5 days after surgery and you do not need to reapply a new dressing. Once you remove the dressing, you will notice that you have the surgical glue (dermabond) on the incision and this will peel off on its own. You can get this dressing wet in the shower the days after surgery prior to removal on the 5th day.   We will plan to see you in the office one week from surgery for foley catheter removal and a voiding trial.  WE PRESCRIBED SENNAKOT-S TO USE IF YOU BECOME CONSTIPATED. DO NOT TAKE THEM NOW SINCE YOU WERE HAVING LOOSE STOOLS. YOU CAN SEE HOW THINGS ARE MOVING OVER THE NEXT COUPLE DAYS AND CAN TAKE THIS IF YOU HAVE NOT HAD A BOWEL MOVEMENT.   Activity: 1. Be up and out of the bed during the day.  Take a nap if needed.  You may walk up steps but be careful and use the hand rail.  Stair climbing will tire you more than you think, you may need to stop part way and rest.    2. No lifting or straining for 6 weeks over 10 pounds. No pushing, pulling, straining for 6 weeks.   3. No driving for 2 week(s).  Do not drive if you are taking narcotic pain medicine and make sure that your reaction time has returned.    4. You can shower as soon as the next day after surgery. Shower daily.  Use your regular soap and water (not directly on the incision) and pat your incision(s) dry afterwards; don't rub.  No tub baths or submerging your body in water until cleared by your surgeon. If you have the soap that was given to you by pre-surgical testing that was used before surgery, you do not need to use it afterwards because this can irritate your incisions.    5. No sexual activity and nothing in the vagina for 10 weeks.   6. You may experience a small amount of clear drainage from your incision, which is normal.  If the drainage persists,  increases, or changes color please call the office.   7. Do not use creams, lotions, or ointments such as neosporin on your incision after surgery until advised by your surgeon because they can cause removal of the dermabond glue on your incisions.     8. You may experience vaginal spotting after surgery or around the 6-8 week mark from surgery when the stitches at the top of the vagina begin to dissolve.  The spotting is normal but if you experience heavy bleeding, call our office.   9. Take Tylenol or ibuprofen first for pain if you are able to take these medications and only use narcotic pain medication for severe pain not relieved by the Tylenol or Ibuprofen.  Monitor your Tylenol intake to a max of 4,000 mg in a 24 hour period. You can alternate these medications after surgery.  Can take AZO over-the-counter for bladder discomfort   Diet: 1. Low sodium Heart Healthy Diet is recommended but you are cleared to resume your normal (before surgery) diet after your procedure.   2. It is safe to use a laxative, such as Miralax or Colace, if you have difficulty moving your bowels. You will be prescribed Sennakot-S to take at bedtime every evening after surgery to keep bowel  movements regular and to prevent constipation.     Wound Care: 1. Keep clean and dry.  Shower daily.   Reasons to call the Doctor: Fever - Oral temperature greater than 100.4 degrees Fahrenheit Foul-smelling vaginal discharge Difficulty urinating Nausea and vomiting Increased pain at the site of the incision that is unrelieved with pain medicine. Difficulty breathing with or without chest pain New calf pain especially if only on one side Sudden, continuing increased vaginal bleeding with or without clots.   Contacts: For questions or concerns you should contact:   Dr. Bernadene Bell at [place], NP at [phone]   After Hours: call [phone] and have the GYN Oncologist paged/contacted  (after 5 pm or on the weekends).   Messages sent via mychart are for non-urgent matters and are not responded to after hours so for urgent needs, please call the after hours number.

## 2022-09-13 NOTE — Progress Notes (Signed)
Pt refused flutter teaching at this time

## 2022-09-13 NOTE — Progress Notes (Signed)
Gynecologic Oncology Progress Note  Patient alert, oriented, sitting up in bed eating [employer] soup. Loose stools have slowed along with abdominal cramping improving. Reporting incisional soreness but improved compared to previous days. Patient asking "if I am doing so well, why are yall keeping me here?" Discussed with the patient since she has increased her po intake this afternoon and is eating more, abdominal cramping improved, she could go home right now then patient in turn voiced wanting to wait until the am for discharge to give her more time to improve. All questions answered. No concerns or needs voiced at end of visit. Will plan for discharge in the am if still doing well.

## 2022-09-14 NOTE — Discharge Summary (Signed)
Physician Discharge Summary  Patient ID: Sarah Morris MRN: [id number] DOB/AGE: 42-Jul-1982 42 y.o.  Admit date: 09/10/2022 Discharge date: 09/14/2022  Admission Diagnoses: Cervical cancer, FIGO stage IB1 [facility])  Discharge Diagnoses:  Principal Problem:   Cervical cancer, FIGO stage IB1 ([place]) Active Problems:   Cervical cancer [name])   Discharged Condition: good  Hospital Course: On 09/10/2022, the patient underwent the following: Procedure(s): OPEN RADICAL HYSTERECTOMY OPEN BILATERAL SALPINGECTOMY PELVIC LYMPH NODE DISSECTION.   The postoperative course was remarkable for a reactive airways flare, decreased appetite and depressive/anxiety sxs.  Her O2 sats remained in range on RA; she was treated w/nebulizers. RT was consulted.  There was a gradual increase in her po intake.  She was discharged to home on postoperative day 4 tolerating a regular diet, meeting all postoperative goals.   Consults:  RT  Significant Diagnostic Studies: None  Treatments: respiratory therapy: albuterol nebulizer and surgery: see above.  Discharge Exam: Blood pressure 128/84, pulse 78, temperature 98.7 F (37.1 C), temperature source Oral, resp. rate 16, height '5\' 2"'$  (1.575 m), weight 52.2 kg, last menstrual period 08/27/2022, SpO2 97 %. General appearance: alert GI: soft, non-tender; bowel sounds normal; no masses,  no organomegaly Extremities: Homans sign is negative, no sign of DVT Incision/Wound:  Honeycomb dressing C/Morris PV loss: no PVB  Disposition: Discharge disposition: 01-Home or Self Care       Discharge Instructions      Remove dressing in 72 hours   Complete by: As directed    Activity as tolerated - No restrictions   Complete by: As directed    Call MD for:  extreme fatigue   Complete by: As directed    Call MD for:  persistant dizziness or light-headedness   Complete by: As directed    Call MD for:  persistant nausea and vomiting   Complete by: As directed    Call MD  for:  redness, tenderness, or signs of infection (pain, swelling, redness, odor or green/yellow discharge around incision site)   Complete by: As directed    Call MD for:  severe uncontrolled pain   Complete by: As directed    Call MD for:  temperature >100.4   Complete by: As directed    Diet - low sodium heart healthy   Complete by: As directed    Diet general   Complete by: As directed    Driving Restrictions   Complete by: As directed    No driving for 1- 2 weeks   Increase activity slowly   Complete by: As directed    Lifting restrictions   Complete by: As directed    No lifting > 10 lbs for 6 weeks   May shower / Bathe   Complete by: As directed    No tub baths for 6 weeks   Sexual Activity Restrictions   Complete by: As directed    No intercourse for 6 - 8 weeks      Allergies as of 09/14/2022       Reactions   Black Pepper-turmeric Anaphylaxis   Food Swelling   Black pepper   Bacitracin-polymyxin B Rash   Neosporin [neomycin-bacitracin Zn-polymyx] Rash        Medication List     TAKE these medications    ibuprofen 800 MG tablet Commonly known as: ADVIL Take 1 tablet (800 mg total) by mouth every 8 (eight) hours as needed for moderate pain. For AFTER surgery   oxyCODONE 5 MG immediate release tablet Commonly known  as: Oxy IR/ROXICODONE Take 1 tablet (5 mg total) by mouth every 4 (four) hours as needed for severe pain. For AFTER surgery only, do not take and drive   senna-docusate 8.6-50 MG tablet Commonly known as: Senokot-S Take 2 tablets by mouth at bedtime. For AFTER surgery, do not take if having diarrhea or loose stools        Follow-up Information     Sarah John D, NP Follow up on 09/18/2022.   Specialty: Gynecologic Oncology Why: at 1:30 pm at the [facility] for foley catheter removal. Contact information: [place] [address] [phone]         Sarah Bell, MD Follow up on 09/30/2022.   Specialty:  Gynecologic Oncology Why: at 1:30pm at the [facility]. Contact information: [place] [address] [phone]                 Signed: Lahoma Morris 09/14/2022, 11:17 AM

## 2022-09-14 NOTE — Progress Notes (Signed)
Assessment unchanged. Pt verbalized understanding of dc instructions given including medications and follow up care. Indwelling foley to stay post dc, instructions given how to apply a leg bag and how to attach the big bag at night. Discharged via wc to front entrance by husband and Therapist, sports.

## 2022-09-14 NOTE — Plan of Care (Signed)

## 2022-09-16 ENCOUNTER — Telehealth: Payer: Self-pay

## 2022-09-16 NOTE — Telephone Encounter (Signed)
Spoke with Sarah Morris this morning. She states she is eating, drinking and urinating well. She has had a BM and is passing gas. She is taking senokot as prescribed and encouraged her to drink plenty of water. She denies fever or chills. Incisions are dry and intact. She rates her pain 5/10. Her pain is controlled with Ibuprofen. Discomfort from having the catheter. Aware of appointment on Wednesday 11/1 for catheter removal.  Instructed to call office with any fever, chills, purulent drainage, uncontrolled pain or any other questions or concerns. Patient verbalizes understanding.   Pt aware of post op appointments as well as the office number [phone] and after hours number [phone] to call if she has any questions or concerns

## 2022-09-18 ENCOUNTER — Inpatient Hospital Stay: Payer: Commercial Managed Care - HMO | Attending: Gynecologic Oncology | Admitting: Gynecologic Oncology

## 2022-09-18 ENCOUNTER — Inpatient Hospital Stay: Payer: Commercial Managed Care - HMO

## 2022-09-18 ENCOUNTER — Other Ambulatory Visit: Payer: Self-pay | Admitting: *Deleted

## 2022-09-18 VITALS — BP 137/85 | HR 92 | Temp 98.5°F | Resp 18 | Ht 62.0 in | Wt 120.0 lb

## 2022-09-18 DIAGNOSIS — R109 Unspecified abdominal pain: Secondary | ICD-10-CM

## 2022-09-18 DIAGNOSIS — N898 Other specified noninflammatory disorders of vagina: Secondary | ICD-10-CM

## 2022-09-18 DIAGNOSIS — Z978 Presence of other specified devices: Secondary | ICD-10-CM

## 2022-09-18 DIAGNOSIS — G8918 Other acute postprocedural pain: Secondary | ICD-10-CM

## 2022-09-18 DIAGNOSIS — Z90710 Acquired absence of both cervix and uterus: Secondary | ICD-10-CM | POA: Diagnosis not present

## 2022-09-18 DIAGNOSIS — C539 Malignant neoplasm of cervix uteri, unspecified: Secondary | ICD-10-CM | POA: Insufficient documentation

## 2022-09-18 DIAGNOSIS — Z9079 Acquired absence of other genital organ(s): Secondary | ICD-10-CM | POA: Diagnosis not present

## 2022-09-18 MED ORDER — TRAMADOL HCL 50 MG PO TABS: 50.0000 mg | ORAL_TABLET | Freq: Four times a day (QID) | ORAL | 0 refills | Status: DC | PRN

## 2022-09-18 MED ORDER — CLINDAMYCIN HCL 300 MG PO CAPS: 300.0000 mg | ORAL_CAPSULE | Freq: Two times a day (BID) | ORAL | 0 refills | Status: DC

## 2022-09-18 NOTE — Patient Instructions (Addendum)
Today we removed the foley catheter from your bladder. You will need to attempt to urinate at home at least every 2-3 hours during the day.  If you are unable to void, you will need to call the office before 5 pm or go to the ER after hours to have the foley replaced.   Dr. Ernestina Patches would like for you to start taking clindamycin 300 mg twice daily for 7 days due to the foul smelling discharge. She will plan to see you in the office on Monday.   I sent in tramadol which is a milder pain medication. Do not take this with oxycodone. Use one or the other if needed.  Please call the office right away if you feel you are emptying your bladder, you develop worsening lower abdominal pain/swelling, only urinating small amounts or unable to urinate at all. Sometimes, the catheter needs to replaced for an additional short period of time.  We will reach out to Dr. Ernestina Patches about the discharge you are having to see what she recommends.   Please call the office for any new symptoms such as fever, feeling poorly, nausea/vomiting etc.  You can continue with taking 2 sennakots every day and you may need to add in a capful of miralax too if you feel your stool is on the harder side, not moving normally, increased pain with having a bowel movement.

## 2022-09-19 LAB — URINE CULTURE: Culture: NO GROWTH

## 2022-09-19 LAB — SURGICAL PATHOLOGY

## 2022-09-19 NOTE — Progress Notes (Signed)
GYN Oncology Post-op Follow up  Sarah Morris is a 41 year old female who presents to the office today for foley removal and a voiding trial. She is s/p radical abdominal hysterectomy with upper vaginectomy (2cm), bilateral salpingectomy, bilateral pelvic lymphadenectomy on 09/10/22 with Dr. Bernadene Bell.   She feels her appetite is increasing. She has been taking sennakot daily but reports experiencing pain when having a bowel movement. Her foley catheter has been draining adequate amounts at home and she has started getting the sensation of needing to void. Starting Sunday, she began noticing a brownish vaginal discharge with an odor. She has been experiencing pelvic pain intermittently and feels the pain medication is strong and makes her feel out of it when taking it. No lower extremity edema reported. She is nervous about the catheter removal causing pain.  Exam:  Patient alert, oriented, in no acute distress. Lungs clear. Heart regular in rate and rhythm. Abdomen soft, active bowel sounds. Midline abdominal incision intact with dermabond without erythema or drainage. No lower extremity edema appreciated.   190 cc of sterile saline instilled in the bladder via foley. Pt unable to tolerate a larger volume. Foley removed at 1:50 pm. Patient was able to void 120 cc five minutes later.   Assessment/Plan: 41 year old female with cervical cancer s/p radical abdominal hysterectomy with upper vaginectomy (2cm), bilateral salpingectomy, bilateral pelvic lymphadenectomy on 09/10/22 with Dr. Bernadene Bell. Advised to start taking clindamycin 300 mg twice daily for 7 days due to the foul smelling discharge and recent BV infection per Dr. Ernestina Patches. New prescription sent in for tramadol for pain. Reportable signs and symptoms reviewed. Patient advised to seek care in the ER if unable to void.   Update: 16:29 pm: pt represented to the office. She has not voided since foley removal and does not have the  sensation to urinate after drinking several cups of fluids. She is concerned about the possible need to seek care in the ER if unable to void. Discussed plan to insert a foley catheter in an in and out fashion and if 200 or less, plan for removal. If greater than 200, plan to leave foley in for additional time before another voiding trial. 215 cc of urine out. Patient would like for the foley to stay in. Dr. Ernestina Patches made aware. Appt made for Nov 6 for follow up along with another voiding trial.

## 2022-09-20 ENCOUNTER — Telehealth: Payer: Self-pay | Admitting: Surgery

## 2022-09-20 NOTE — Telephone Encounter (Signed)
Called patient to follow up and relay results of urine culture. Patient states pain is 8/10 and doesn't feel tramadol is helping. Describes pain as cramping pain that comes and goes.Worse when she eats. She is taking sennokot as prescribed and is having bowel movements. Patient has not been taking ibuprofen. Advised patient to alternate her tramadol and ibuprofen, taking both as prescribed for better pain management. Patient verbalized understanding and repeated back medication instructions. She had no other concerns at this time. Patient aware of follow up appointment on 11/6 at 12:30pm.

## 2022-09-23 ENCOUNTER — Inpatient Hospital Stay (HOSPITAL_BASED_OUTPATIENT_CLINIC_OR_DEPARTMENT_OTHER): Payer: Commercial Managed Care - HMO | Admitting: Psychiatry

## 2022-09-23 ENCOUNTER — Inpatient Hospital Stay: Payer: Commercial Managed Care - HMO | Admitting: Psychiatry

## 2022-09-23 ENCOUNTER — Encounter: Payer: Self-pay | Admitting: Psychiatry

## 2022-09-23 VITALS — BP 117/86 | HR 85 | Temp 97.9°F | Resp 16 | Ht 62.0 in | Wt 120.0 lb

## 2022-09-23 DIAGNOSIS — Z90710 Acquired absence of both cervix and uterus: Secondary | ICD-10-CM

## 2022-09-23 DIAGNOSIS — Z9079 Acquired absence of other genital organ(s): Secondary | ICD-10-CM

## 2022-09-23 DIAGNOSIS — C539 Malignant neoplasm of cervix uteri, unspecified: Secondary | ICD-10-CM

## 2022-09-23 NOTE — Patient Instructions (Signed)
It was a pleasure to see you in clinic today. - Complete your antibiotics - Keep up with your bowel regimen for now. - Return visit planned for 2 weeks.  Thank you very much for allowing me to provide care for you today.  I appreciate your confidence in choosing our Gynecologic Oncology team at [facility].  If you have any questions about your visit today please call our office or send [place] a MyChart message and we will get back to you as soon as possible.

## 2022-09-23 NOTE — Progress Notes (Signed)
Gynecologic Oncology Return Clinic Visit  Date of Service: 09/23/2022 Referring Provider: Christophe Louis, MD  Assessment & Plan: Sarah Morris is a 41 y.o. 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  Postop: - Pt recovering well from surgery and healing appropriately postoperatively. - Intraoperative findings and pathology results reviewed. - Ongoing postoperative expectations and precautions reviewed. Continue with no lifting >10lbs through 6 weeks postoperatively. Nothing in the vagina through 8 weeks postop. - Void trial repeated today with adequate void following back fill.  Patient advised to continued with scheduled voids every 4-6 hours for the first few days to ensure that she is completely emptying. -Patient also advised to continue with her bowel regimen for now as she continues to use narcotic pain medication.   Abnormal vaginal discharge:  - Improving per pt. - Pt instructed to continue abx through completion.  IB2 SCC of the cervix: -Reviewed patient's pathology results. - Negative margin's, however, given tumor size, LVSI, and depth of invasion of the cervical stroma, patient meets SEDLIS criteria. -Reviewed with patient that her case will be discussed at tumor board next week. - However given her results, suspect that we will be recommending adjuvant radiation therapy.  General expectations with radiation reviewed including schedule and side effects. -Reviewed that I would follow patient closely following adjuvant treatment for signs and symptoms of recurrence. -Referral placed to radiation oncology.  RTC 2 weeks.  Bernadene Bell, MD Gynecologic Oncology   Medical Decision Making I personally spent  TOTAL 31 minutes face-to-face and non-face-to-face in the care of  this patient, which includes all pre, intra, and post visit time on the date of service.  3 minutes spent reviewing records prior to the visit 20 Minutes in patient contact 8 minutes charting , conferring with consultants etc.   ----------------------- Reason for Visit: Postop/Counseling  Treatment History: Oncology History  Cervical cancer, FIGO stage IB1 ([place])  06/10/2022 Procedure   Pap: HSIL, HPV 16+   07/24/2022 Initial Biopsy      08/23/2022 Imaging   PET:  IMPRESSION: 1. Marked ill-defined cervical FDG avidity consistent with primary cervical neoplasm. 2. No hypermetabolic abdominopelvic adenopathy or convincing evidence of hypermetabolic distant metastatic disease. 3. Very small volume pelvic free fluid does not demonstrate significant abnormal FDG avidity and there is no discrete hypermetabolic peritoneal nodularity, volume is within physiologic normal limits but is new from prior imaging. Suggest attention on follow-up imaging. 4. Scattered nodular areas of hypermetabolic uterine activity most notably involving a nodular focus in the uterine fundus with a max SUV of 4.33, commonly reflecting uterine fibroids. 5. Aortic Atherosclerosis (ICD10-I70.0) and Emphysema (ICD10-J43.9).   09/10/2022 Surgery   Radical abdominal hysterectomy with upper vaginectomy (2cm), bilateral salpingectomy, bilateral pelvic lymphadenectomy (Class C1 radical hysterectomy)    09/10/2022 Cancer Staging   Staging form: Cervix Uteri, AJCC Version 9 - Pathologic stage from 09/10/2022: FIGO Stage IB2 (pT1b2, pN0, cM0) - Signed by Bernadene Bell, MD on 09/23/2022 Histopathologic type: Squamous cell carcinoma, NOS Stage prefix: Initial diagnosis Histologic grade (G): G3 Histologic grading system: 3 grade system Residual tumor (R): R0 - None Lymph-vascular invasion (LVI): BOTH lymphatic and small vessel AND venous (large vessel) invasion    Pathology Results   FINAL MICROSCOPIC DIAGNOSIS: A.  LYMPH NODES, RIGHT PELVIC, RESECTION: - Five lymph nodes with one showing evidence of endosalpingosis,  negative for carcinoma (0/5) B. LYMPH NODES, LEFT PELVIC, RESECTION: - Four lymph nodes with some showing evidence of endosalpingosis, negative for carcinoma (0/4) C. ADHERENT OMENTUM, EXCISION: - Portion of omentum, negative for carcinoma D. UTERUS, CERVIX, BILATERAL FALLOPIAN TUBES, UPPER 2CM OF VAGINA, EXCISION: - Invasive moderate to poorly differentiated squamous cell carcinoma, 2.4 cm, involving all quadrants of the cervix - Carcinoma invades into the stroma for a depth of 1.2 cm (of 1.7cm, >50%) - Lymphovascular invasion is identified - Resection margins are negative for carcinoma - Uterus with benign inactive endometrium and benign leiomyomata, up to 2.2 cm - Benign unremarkable bilateral fallopian tubes - Portion of vagina, negative for carcinoma - See oncology table      Interval History: Patient presented on 09/18/2022 for a void trial.  At that time she had 190 cc instilled and voided 120.  Patient was advised to continue to monitor for ongoing voiding, however patient was concerned that she may not be able to void and a Foley was replaced with 215 cc out.  So Foley was retained.  Patient reports that she is overall feeling improved from her last visit.  She continues to use ibuprofen and Tylenol scheduled with tramadol as needed for pain.  She is also using oxycodone still but at night given that it makes her feel strange, but she feels that it works better than the tramadol.  She also has been taking the antibiotic as prescribed with about 2 days left.  She feels that this has improved the vaginal discharge and odor that she previously reported.  She is otherwise eating and drinking well.  She is having regular bowel movements with the prescribed medications.  She reports her pain is currently at a 5-6 out of 10.  Past Medical/Surgical History: Past Medical History:   Diagnosis Date   Alcoholism ([place])    Cervical cancer ([place])    Ectopic pregnancy    multiple    Past Surgical History:  Procedure Laterality Date   BILATERAL SALPINGECTOMY N/A 09/10/2022   Procedure: OPEN BILATERAL SALPINGECTOMY;  Surgeon: Bernadene Bell, MD;  Location: [facility];  Service: Gynecology;  Laterality: N/A;   DILATION AND CURETTAGE OF UTERUS     LYMPH NODE DISSECTION N/A 09/10/2022   Procedure: PELVIC LYMPH NODE DISSECTION;  Surgeon: Bernadene Bell, MD;  Location: [facility];  Service: Gynecology;  Laterality: N/A;   RADICAL HYSTERECTOMY N/A 09/10/2022   Procedure: OPEN RADICAL HYSTERECTOMY;  Surgeon: Bernadene Bell, MD;  Location: [facility];  Service: Gynecology;  Laterality: N/A;    Family History  Problem Relation Age of Onset   Hypertension Mother    Cancer Father        unknown   Diabetes Maternal Grandmother    Lung cancer Maternal Grandfather    Diabetes Cousin    Stomach cancer Neg Hx    Esophageal cancer Neg Hx    Colon cancer Neg Hx     Social History   Socioeconomic History   Marital status: Single    Spouse name: Not on file   Number of children: Not on file   Years of education: Not on file   Highest education level: Not on file  Occupational History   Occupation: handler  Tobacco Use   Smoking status: Every Day    Packs/day: 0.50    Types: Cigarettes    Start date: 2008   Smokeless tobacco: Never  Vaping Use   Vaping Use: Never used  Substance and Sexual Activity  Alcohol use: Yes    Alcohol/week: 25.0 standard drinks of alcohol    Types: 25 Cans of beer per week    Comment: occassionally   Drug use: Yes    Frequency: 7.0 times per week    Types: Marijuana   Sexual activity: Yes  Other Topics Concern   Not on file  Social History Narrative   Not on file   Social Determinants of Health   Financial Resource Strain: High Risk (08/19/2022)   Overall Financial Resource Strain (CARDIA)    Difficulty of Paying Living Expenses: Hard   Food Insecurity: Food Insecurity Present (09/12/2022)   Hunger Vital Sign    Worried About Running Out of Food in the Last Year: Often true    Ran Out of Food in the Last Year: Sometimes true  Transportation Needs: No Transportation Needs (08/19/2022)   PRAPARE - Hydrologist (Medical): No    Lack of Transportation (Non-Medical): No  Physical Activity: Not on file  Stress: Not on file  Social Connections: Not on file    Current Medications:  Current Outpatient Medications:    clindamycin (CLEOCIN) 300 MG capsule, Take 1 capsule (300 mg total) by mouth 2 (two) times daily., Disp: 14 capsule, Rfl: 0   ibuprofen (ADVIL) 800 MG tablet, Take 1 tablet (800 mg total) by mouth every 8 (eight) hours as needed for moderate pain. For AFTER surgery, Disp: 30 tablet, Rfl: 0   oxyCODONE (OXY IR/ROXICODONE) 5 MG immediate release tablet, Take 1 tablet (5 mg total) by mouth every 4 (four) hours as needed for severe pain. For AFTER surgery only, do not take and drive, Disp: 15 tablet, Rfl: 0   senna-docusate (SENOKOT-S) 8.6-50 MG tablet, Take 2 tablets by mouth at bedtime. For AFTER surgery, do not take if having diarrhea or loose stools, Disp: 30 tablet, Rfl: 0   traMADol (ULTRAM) 50 MG tablet, Take 1 tablet (50 mg total) by mouth every 6 (six) hours as needed for severe pain. For AFTER surgery, do not take and drive, Disp: 10 tablet, Rfl: 0  Review of Symptoms: Complete 10-system review is negative except as above in Interval History.  Physical Exam: BP 117/86 (BP Location: Left Arm, Patient Position: Sitting)   Pulse 85   Temp 97.9 F (36.6 C) (Oral)   Resp 16   Ht '5\' 2"'$  (1.575 m)   Wt 120 lb (54.4 kg)   BMI 21.95 kg/m  General: Alert, oriented, no acute distress. HEENT: Normocephalic, atraumatic. Neck symmetric without masses. Sclera anicteric. Chest: Normal work of breathing. Clear to auscultation bilaterally.  Cardiovascular: Regular rate and rhythm, no  murmurs. Abdomen: Soft, appropriately nontender.  Normoactive bowel sounds.  No masses or hepatosplenomegaly appreciated.  Well-healing midline incision with surgical glue in place. Extremities: Grossly normal range of motion.  Warm, well perfused.  No edema bilaterally. Skin: No rashes or lesions noted. GU: Normal appearing external genitalia without erythema, excoriation, or lesions.  Speculum exam reveals small gray/brown vaginal discharge.  Otherwise intact vaginal cuff with sutures in place and no erythema.  Bimanual exam reveals intact vaginal cuff with no tenderness or fluctuance.  Exam chaperoned by Joylene John, NP   Laboratory & Radiologic Studies: FINAL MICROSCOPIC DIAGNOSIS:  A. LYMPH NODES, RIGHT PELVIC, RESECTION: - Five lymph nodes with one showing evidence of endosalpingosis, negative for carcinoma (0/5)  B. LYMPH NODES, LEFT PELVIC, RESECTION: - Four lymph nodes with some showing evidence of endosalpingosis, negative for carcinoma (0/4)  C.  ADHERENT OMENTUM, EXCISION: - Portion of omentum, negative for carcinoma  D. UTERUS, CERVIX, BILATERAL FALLOPIAN TUBES, UPPER 2CM OF VAGINA, EXCISION: - Invasive moderate to poorly differentiated squamous cell carcinoma, 2.4 cm, involving all quadrants of the cervix - Carcinoma invades into the stroma for a depth of 1.2 cm - Lymphovascular invasion is identified - Resection margins are negative for carcinoma - Uterus with benign inactive endometrium and benign leiomyomata, up to 2.2 cm - Benign unremarkable bilateral fallopian tubes - Portion of vagina, negative for carcinoma - See oncology table      ONCOLOGY TABLE:  UTERINE CERVIX, CARCINOMA: Resection  Procedure: Total hysterectomy and bilateral salpingectomy Tumor Size: 2.4 cm Histologic Type: Squamous cell carcinoma, HPV-associated Histologic Grade: G3: Poorly differentiated Stromal Invasion: Present      Depth of stromal invasion (mm): 12 mm Other Tissue/  Organ: Portion of vagina, negative for carcinoma Margins: All margins negative for invasive carcinoma Margin Status for HSIL or AIS: All margins negative for HSIL or AIS Lymphovascular invasion: Present      Regional Lymph Nodes: Pelvic Lymph Nodes Examined:                                  0 Sentinel                                  9 Non-sentinel                                  9 Total Pelvic Lymph Nodes with Metastasis: 0 Para-aortic Lymph Nodes Examined:                                 0 Sentinel                                 0 Non-sentinel                                 0 Total Distant Metastasis:      Distant sites involved: Not applicable Pathologic Stage Classification (pTNM, AJCC 8th Edition): pT1b2, pN0 Ancillary Studies: Can be performed upon request Representative Tumor Block: D19 Comment(s): None  (v5.0.1.2)    AMENDMENT NOTE:  The tumor size was indicated as 0.4 cm in the [facility] oncology table. This has been corrected to 2.4 cm.  Additionally, the total cervical stromal thickness is 1.7 cm where depth of tumor invasion is 1.2 cm (>50%).

## 2022-09-24 ENCOUNTER — Telehealth: Payer: Self-pay | Admitting: Surgery

## 2022-09-24 NOTE — Telephone Encounter (Signed)
Called patient to see how she's doing following foley removal. LVM instructing patient to call with any concerns.

## 2022-09-26 ENCOUNTER — Telehealth: Payer: Self-pay

## 2022-09-26 NOTE — Telephone Encounter (Signed)
Pt calling stating she is in a lot of pain. Pain is in the "bottom half" 6-7/10 on pain scale. BM's are normal, with the use of Senna, but has a lot of pressure right before she has to go. Also, having "irritating and stinging" when she urinates where the catheter was. She has not been sleeping at night. She states the Tramadol with Ibuprofen has not helped much at all. The Oxycodone was helping with the pain enough so she could get sleep. She is requesting refill on the Oxycodone, she said at least 7,1 a day for a week so she can take it at night to help with sleep.  Eating and drinking fine, no fever/chills. Incision dry and intact, with a little pain on the right incision.  Pt aware Dr. Ernestina Patches is out of the office but I would still notify her and Joylene John NP and call with advise.

## 2022-09-27 ENCOUNTER — Other Ambulatory Visit: Payer: Self-pay | Admitting: Gynecologic Oncology

## 2022-09-27 ENCOUNTER — Telehealth: Payer: Self-pay | Admitting: Psychiatry

## 2022-09-27 DIAGNOSIS — C539 Malignant neoplasm of cervix uteri, unspecified: Secondary | ICD-10-CM

## 2022-09-27 MED ORDER — OXYCODONE HCL 5 MG PO TABS: 5.0000 mg | ORAL_TABLET | ORAL | 0 refills | Status: DC | PRN

## 2022-09-27 NOTE — Telephone Encounter (Signed)
Pt aware medication has been sent to pharmacy.

## 2022-09-27 NOTE — Telephone Encounter (Signed)
Patient called and asked if Dr Ernestina Patches had sent any more pain medication to the pharmacy for pick-up.  Advised patient would send message for someone to follow up and call her back.

## 2022-09-30 ENCOUNTER — Encounter: Payer: Self-pay | Admitting: Gynecologic Oncology

## 2022-09-30 ENCOUNTER — Inpatient Hospital Stay: Payer: Commercial Managed Care - HMO | Admitting: Psychiatry

## 2022-09-30 ENCOUNTER — Telehealth: Payer: Self-pay | Admitting: Psychiatry

## 2022-09-30 NOTE — Telephone Encounter (Signed)
Called patient to discuss tumor board recommendations including adding radiation sensitizing cisplatin to her adjuvant radiation.  Patient understanding of recommendation.  No additional questions.

## 2022-09-30 NOTE — Progress Notes (Signed)
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 Cervix, biopsy, 11 o'clock outer - SQUAMOUS MUCOSA WITH MILD ATYPIA SUGGESTIVE OF KOILOCYTIC EFFECT. 2. Cervix, biopsy, 11 o'clock OS - LOW GRADE SQUAMOUS INTRAEPITHELIAL LESION (CIN-I, MILD SQUAMOUS DYSPLASIA WITH HPV EFFECT) IN CERVICAL TRANSFORMATION ZONE MUCOSA. 3. Cervix, biopsy, 12 o'clock outer - LOW GRADE SQUAMOUS INTRAEPITHELIAL LESION (CIN-I, HPV EFFECT) IN SQUAMOUS EPITHELIUM. 4. Cervix, biopsy, 1 o'clock outer - LOW GRADE SQUAMOUS INTRAEPITHELIAL LESION (CIN-I, HPV EFFECT) IN SQUAMOUS EPITHELIUM. 5. Endocervix, curettage - LOW GRADE SQUAMOUS INTRAEPITHELIAL LESION (CIN-I, HPV EFFECT) IN SQUAMOUS EPITHELIUM. - FRAGMENTS OF BENIGN ENDOCERVICAL GLANDULAR MUCOSA.  07/24/2022 Diagnosis 1. Endocervix, curettage NUMEROUS DETACHED FRAGMENTS OF HIGH-GRADE SQUAMOUS DYSPLASIA AND SQUAMOUS CELL CARCINOMA 2. Cervix, biopsy, 6 o'clock INVASIVE MODERATELY DIFFERENTIATED SQUAMOUS CELL CARCINOMA TUMOR INVOLVES THE FULL-THICKNESS AND DEEP MARGIN OF THE BIOPSY (6.2 MM) PERINEURAL INVASION PRESENT SUSPICIOUS FOR LYMPHOVASCULAR INVASION 3. Cervix, biopsy, 9 o'clock INVASIVE MODERATELY DIFFERENTIATED SQUAMOUS CELL CARCINOMA TUMOR INVOLVES FULL-THICKNESS OF THE BIOPSY AND THE DEEP MARGIN (2 MM)  09/10/2022 FINAL MICROSCOPIC DIAGNOSIS:  A. LYMPH NODES, RIGHT PELVIC, RESECTION: - Five lymph nodes with one showing evidence of endosalpingosis, negative for carcinoma (0/5)  B. LYMPH NODES, LEFT PELVIC, RESECTION: - Four lymph nodes with some showing evidence of endosalpingosis, negative for carcinoma (0/4)  C. ADHERENT OMENTUM, EXCISION: - Portion of omentum, negative for carcinoma  D. UTERUS, CERVIX, BILATERAL FALLOPIAN TUBES, UPPER 2CM OF VAGINA, EXCISION: - Invasive moderate to poorly  differentiated squamous cell carcinoma, 2.4 cm, involving all quadrants of the cervix - Carcinoma invades into the stroma for a depth of 1.2 cm - Lymphovascular invasion is identified - Resection margins are negative for carcinoma - Uterus with benign inactive endometrium and benign leiomyomata, up to 2.2 cm - Benign unremarkable bilateral fallopian tubes - Portion of vagina, negative for carcinoma - See oncology table   Past/Anticipated interventions by Gyn/Onc surgery, if any: Radical abdominal hysterectomy with upper vaginectomy (2cm), bilateral salpingectomy, bilateral pelvic lymphadenectomy 09/10/2022 by Dr. Ernestina Patches  Past/Anticipated interventions by medical oncology, if any: apt with Dr. Alvy Bimler 10/08/2022.  Weight changes, if any:  has lost 4 lbs since surgery - feels fuller than normal after eating.  Bowel/Bladder complaints, if any: taking senokot and reports bowels are now regular. Reports having some pressure in her bladder.  Nausea/Vomiting, if any: no  Pain issues, if any:  Rating at a 7/10 from cramping in her abdomen.  Currently taking oxycodone 5 mg twice a day.  SAFETY ISSUES: Prior radiation? Reports having radiation when she was 10 for a keloid on her right neck. Pacemaker/ICD? no Possible current pregnancy? no Is the patient on methotrexate? no  Current Complaints / other details:  Reports having numbness in her right hand when she wakes up.  She noticed this 2 weeks ago. She is here with a friend.  BP 102/70 (BP Location: Left Arm, Patient Position: Sitting, Cuff Size: Normal)   Pulse 81   Temp 97.9 F (36.6 C)   Resp 20   Ht '5\' 2"'$  (1.575 m)   Wt 116 lb 6.4 oz (52.8 kg)   SpO2 100%   BMI 21.29 kg/m

## 2022-09-30 NOTE — Progress Notes (Signed)
Sarah Morris, DOB [birth year], MRN [id number], Case# [id number]: PDL-1 ordered with Sarah Morris in [place].

## 2022-10-01 ENCOUNTER — Inpatient Hospital Stay: Payer: Commercial Managed Care - HMO | Admitting: Hematology and Oncology

## 2022-10-01 ENCOUNTER — Telehealth: Payer: Self-pay | Admitting: *Deleted

## 2022-10-01 ENCOUNTER — Ambulatory Visit: Payer: Self-pay | Admitting: Gynecologic Oncology

## 2022-10-01 ENCOUNTER — Telehealth: Payer: Self-pay | Admitting: Hematology and Oncology

## 2022-10-01 NOTE — Progress Notes (Signed)
Radiation Oncology         [phone] ________________________________  Initial Outpatient Consultation  Name: Sarah Morris MRN: [id number]  Date: 10/02/2022  DOB: 12/02/1980  BD:ZHGDJ, Carollee Massed, NP  Bernadene Bell, MD   REFERRING PHYSICIAN: Bernadene Bell, MD  DIAGNOSIS: There were no encounter diagnoses.  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 female who is accompanied by ***. she is seen as a courtesy of Dr. Ernestina Patches for an opinion concerning radiation therapy as part of management for her recently diagnosed cervical cancer.   The patient presented to [facility] on 04/29/22 for evaluation of epigastric pain and bloating x 2 months. She also presented to an urgent care this past April for the same and was treated for constipation. This prompted a CT of the abdomen and pelvis on 05/06/22 which showed a fibroid uterus, a hepatic hemangioma, and a subcentimeter hypodensity presumably representing a cyst or hemangioma. CT otherwise showed no acute processes. She also underwent an endoscopy on 06/04/22 which did not reveal any obvious cause for her symptoms. Biopsies of the gastric antrum and stomach were also collected which were negative.   The patient later presented to her OB/GYN on 06/10/22 for a routine pap smear. Pap showed HPV positive HSIL. Subsequently, the patient had a colposcopy and cervical biopsies performed on 07/24/22. Biopsies of the 9 and 6 o'clock cervix collected revealed invasive moderately differentiated squamous cell carcinoma with PNI (from the 6 o'clock biopsy),  possible LVSI, and deep margin involvement. Pathology from endocervical curettage also showed numerous detached fragments of high-grade squamous dysplasia and squamous cell carcinoma.   Of note: The patient has a prior history of abnormal pap-smears since 2022 showing high risk HPV. She also had a colposcopy performed back in July of 2022 which showed benign findings.   Accordingly, the patient was referred to Dr. Ernestina Patches (Gyn-Onc) on 08/12/22 for further evaluation and management. Pelvic exam performed during this visit revealed an approximately 1 cm nodule of the posterior cervix with an area of healing from her recent biopsy, as well as hypervascularity in the same area.   To rule out metastatic disease, Dr. Ernestina Patches recommended proceeding with a PET scan prior surgery.   PET scan on 08/21/22 showed marked ill-defined cervical FDG avidity consistent with primary cervical neoplasm. PET otherwise showed  no hypermetabolic abdominopelvic adenopathy or convincing evidence of hypermetabolic distant metastatic disease. (Scattered nodular areas of hypermetabolic uterine activity were noted, most notably involving a nodular focus in the uterine fundus, and likely reflecting uterine fibroids).   The patient opted to proceed with hysterectomy, BSO, and nodal biopsies on 09/10/22 under the care of Dr. Ernestina Patches. Pathology from the procedure revealed: invasive moderate to poorly differentiated squamous cell carcinoma measuring 2.4 cm, and involving all quadrants of the cervix; stromal invasion measuring 1.2 cm in depth; positive for LVI; all margins negative for carcinoma; bilateral fallopian tubes negative for carcinoma; nodal status of 9/9 pelvic lymph nodes negative for carcinoma. Partial excisions of the omentum and vagina were also performed which showed benign findings.   Post-op, the patient developed some odorous vaginal discharge and was prescribed abx.  During her most recent follow up visit with Dr. Ernestina Patches on  09/23/22, she reported a decrease in vaginal discharge and odor. She was also given oxycodone for post-operative pain with relief.    PREVIOUS RADIATION THERAPY: No  PAST MEDICAL HISTORY:  Past Medical History:  Diagnosis Date   Alcoholism ([place])    Cervical cancer ([place])    Ectopic pregnancy    multiple    PAST SURGICAL HISTORY: Past Surgical History:  Procedure Laterality Date   BILATERAL SALPINGECTOMY N/A 09/10/2022   Procedure: OPEN BILATERAL SALPINGECTOMY;  Surgeon: Bernadene Bell, MD;  Location: [facility];  Service: Gynecology;  Laterality: N/A;   DILATION AND CURETTAGE OF UTERUS     LYMPH NODE DISSECTION N/A 09/10/2022   Procedure: PELVIC LYMPH NODE DISSECTION;  Surgeon: Bernadene Bell, MD;  Location: [facility];  Service: Gynecology;  Laterality: N/A;   RADICAL HYSTERECTOMY N/A 09/10/2022   Procedure: OPEN RADICAL HYSTERECTOMY;  Surgeon: Bernadene Bell, MD;  Location: [facility];  Service: Gynecology;  Laterality: N/A;    FAMILY HISTORY:  Family History  Problem Relation Age of Onset   Hypertension Mother    Cancer Father        unknown   Diabetes Maternal Grandmother    Lung cancer Maternal Grandfather    Diabetes Cousin    Stomach cancer Neg Hx    Esophageal cancer Neg Hx    Colon cancer Neg Hx     SOCIAL HISTORY:  Social History   Tobacco Use   Smoking status: Every Day    Packs/day: 0.50    Types: Cigarettes    Start date: 2008   Smokeless tobacco: Never  Vaping Use   Vaping Use: Never used  Substance Use Topics   Alcohol use: Yes    Alcohol/week: 25.0 standard drinks of alcohol    Types: 25 Cans of beer per week    Comment: occassionally   Drug use: Yes    Frequency: 7.0 times per week    Types: Marijuana    ALLERGIES:  Allergies  Allergen Reactions   Black Pepper-Turmeric Anaphylaxis   Food Swelling    Black pepper   Bacitracin-Polymyxin B Rash   Neosporin [Neomycin-Bacitracin Zn-Polymyx] Rash    MEDICATIONS:  Current Outpatient  Medications  Medication Sig Dispense Refill   clindamycin (CLEOCIN) 300 MG capsule Take 1 capsule (300 mg total) by mouth 2 (two) times daily. 14 capsule 0   ibuprofen (ADVIL) 800 MG tablet Take 1 tablet (800 mg total) by mouth every 8 (eight) hours as needed for moderate pain. For AFTER surgery 30 tablet 0   oxyCODONE (OXY IR/ROXICODONE) 5 MG immediate release tablet Take 1 tablet (5 mg total) by mouth every 4 (four) hours as needed for severe pain. For AFTER surgery only, do not take and drive 15 tablet 0   senna-docusate (SENOKOT-S) 8.6-50 MG tablet Take 2 tablets by mouth at bedtime. For AFTER surgery, do not take if having diarrhea or loose stools 30 tablet 0   No current facility-administered medications for this encounter.    REVIEW OF SYSTEMS:  A 10+ POINT REVIEW OF SYSTEMS WAS OBTAINED including neurology, dermatology, psychiatry, cardiac, respiratory, lymph, extremities, GI, GU, musculoskeletal, constitutional, reproductive, HEENT. ***   PHYSICAL EXAM:  vitals were not taken for this visit.   General: Alert and oriented, in no acute distress HEENT: Head is normocephalic. Extraocular movements are intact. Oropharynx is clear. Neck: Neck is supple, no palpable cervical or supraclavicular lymphadenopathy. Heart: Regular in rate and rhythm with  no murmurs, rubs, or gallops. Chest: Clear to auscultation bilaterally, with no rhonchi, wheezes, or rales. Abdomen: Soft, nontender, nondistended, with no rigidity or guarding. Extremities: No cyanosis or edema. Lymphatics: see Neck Exam Skin: No concerning lesions. Musculoskeletal: symmetric strength and muscle tone throughout. Neurologic: Cranial nerves II through XII are grossly intact. No obvious focalities. Speech is fluent. Coordination is intact. Psychiatric: Judgment and insight are intact. Affect is appropriate.  On pelvic examination the external genitalia were unremarkable. A speculum exam was performed. There are no mucosal lesions  noted in the vaginal vault. A Pap smear was obtained of the proximal vagina. On bimanual and rectovaginal examination there were no pelvic masses appreciated. ***   ECOG = ***  0 - Asymptomatic (Fully active, able to carry on all predisease activities without restriction)  1 - Symptomatic but completely ambulatory (Restricted in physically strenuous activity but ambulatory and able to carry out work of a light or sedentary nature. For example, light housework, office work)  2 - Symptomatic, <50% in bed during the day (Ambulatory and capable of all self care but unable to carry out any work activities. Up and about more than 50% of waking hours)  3 - Symptomatic, >50% in bed, but not bedbound (Capable of only limited self-care, confined to bed or chair 50% or more of waking hours)  4 - Bedbound (Completely disabled. Cannot carry on any self-care. Totally confined to bed or chair)  5 - Death   Eustace Pen MM, Creech RH, Tormey DC, et al. [phone]). "Toxicity and response criteria of the [facility]". [place] Oncol. 5 (6): 649-55  LABORATORY DATA:  Lab Results  Component Value Date   WBC 11.0 (H) 09/11/2022   HGB 11.4 (L) 09/11/2022   HCT 34.4 (L) 09/11/2022   MCV 105.5 (H) 09/11/2022   PLT 211 09/11/2022   NEUTROABS 4.9 04/29/2022   Lab Results  Component Value Date   NA 136 09/11/2022   K 4.5 09/11/2022   CL 106 09/11/2022   CO2 22 09/11/2022   GLUCOSE 153 (H) 09/11/2022   BUN 6 09/11/2022   CREATININE 0.69 09/11/2022   CALCIUM 8.7 (L) 09/11/2022      RADIOGRAPHY: No results found.    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  We discussed the natural history of *** and general treatment, highlighting the role of radiotherapy in the  management.  We discussed the available radiation techniques, and focused on the details of logistics and delivery.  We reviewed the anticipated acute and late sequelae associated with radiation in this setting.  The patient was encouraged to ask questions that I answered to the best of my ability. *** A patient consent form was discussed and signed.  We retained a copy for our records.  The patient would like to proceed with radiation and will be scheduled for CT simulation.  PLAN: ***    *** minutes of total time was spent for this patient encounter, including preparation, face-to-face counseling with the patient and coordination of care, physical exam, and documentation of the encounter.   ------------------------------------------------  Blair Promise, PhD, MD  This document serves as a record of services personally performed by Gery Pray, MD. It was created on his behalf by Roney Mans, a trained medical scribe. The  creation of this record is based on the scribe's personal observations and the provider's statements to them. This document has been checked and approved by the attending provider.

## 2022-10-01 NOTE — Telephone Encounter (Signed)
Called and spoke with the patient regarding her missed appt with Dr Alvy Bimler today at 1 pm. Patient stated "I didn't know about the appt for today, only the appt for tomorrow."  Patient rescheduled for 11/21 at 1 pm with an arrival of 12:30 pm. Patient also given the appts for tomorrow with Dr Sondra Come starting at 12:30 pm with an arrival time at 12 pm.

## 2022-10-01 NOTE — Telephone Encounter (Signed)
Scheduled appointment per provider. Patient is aware of appointment date and time. Patient is aware to arrive 15 mins prior to appointment time and to bring updated insurance cards. Patient is aware of location.   

## 2022-10-02 ENCOUNTER — Ambulatory Visit
Admission: RE | Admit: 2022-10-02 | Discharge: 2022-10-02 | Disposition: A | Discharge status: Home / Self Care | Payer: Commercial Managed Care - HMO | Source: Ambulatory Visit | Attending: Radiation Oncology | Admitting: Radiation Oncology

## 2022-10-02 ENCOUNTER — Other Ambulatory Visit: Payer: Self-pay

## 2022-10-02 ENCOUNTER — Encounter: Payer: Self-pay | Admitting: Radiation Oncology

## 2022-10-02 ENCOUNTER — Other Ambulatory Visit: Payer: Self-pay | Admitting: *Deleted

## 2022-10-02 VITALS — BP 102/70 | HR 81 | Temp 97.9°F | Resp 20 | Ht 62.0 in | Wt 116.4 lb

## 2022-10-02 DIAGNOSIS — Z90722 Acquired absence of ovaries, bilateral: Secondary | ICD-10-CM | POA: Diagnosis not present

## 2022-10-02 DIAGNOSIS — C539 Malignant neoplasm of cervix uteri, unspecified: Secondary | ICD-10-CM | POA: Insufficient documentation

## 2022-10-02 DIAGNOSIS — F1010 Alcohol abuse, uncomplicated: Secondary | ICD-10-CM | POA: Insufficient documentation

## 2022-10-02 DIAGNOSIS — Z90710 Acquired absence of both cervix and uterus: Secondary | ICD-10-CM | POA: Diagnosis not present

## 2022-10-02 DIAGNOSIS — R87810 Cervical high risk human papillomavirus (HPV) DNA test positive: Secondary | ICD-10-CM | POA: Diagnosis not present

## 2022-10-02 DIAGNOSIS — F17210 Nicotine dependence, cigarettes, uncomplicated: Secondary | ICD-10-CM | POA: Insufficient documentation

## 2022-10-02 DIAGNOSIS — Z801 Family history of malignant neoplasm of trachea, bronchus and lung: Secondary | ICD-10-CM | POA: Insufficient documentation

## 2022-10-02 NOTE — Progress Notes (Signed)
The proposed treatment discussed in conference is for discussion purpose only and is not a binding recommendation.  The patients have not been physically examined, or presented with their treatment options.  Therefore, final treatment plans cannot be decided.  

## 2022-10-04 ENCOUNTER — Telehealth: Payer: Self-pay | Admitting: Surgery

## 2022-10-04 ENCOUNTER — Telehealth: Payer: Self-pay | Admitting: *Deleted

## 2022-10-04 NOTE — Telephone Encounter (Signed)
Patient called and stated "I have caught a cold and need to know what to take to clear this up. I have a bit of pain in the chest and some SOB. I did try some mucinex and it helped but gave me the runs."  Explained that the message will be given to the provider and Melissa APP and the office will call her back.

## 2022-10-04 NOTE — Telephone Encounter (Signed)
Spoke with patient regarding her concerns with cough medication. Per [name], NP, advised patient she can try Robitussin or other form of Mucinex. Also advised patient she can try using saline nasal spray, a humidifier, and to stay hydrated. Patient denies any fevers at this time. Recommended patient decrease intake of sugar and dairy to help decrease mucous production while she has cold symptoms. Patient verbalized understanding and had no further questions at this time.

## 2022-10-07 ENCOUNTER — Encounter: Payer: Self-pay | Admitting: Psychiatry

## 2022-10-07 ENCOUNTER — Inpatient Hospital Stay (HOSPITAL_BASED_OUTPATIENT_CLINIC_OR_DEPARTMENT_OTHER): Payer: Commercial Managed Care - HMO | Admitting: Psychiatry

## 2022-10-07 VITALS — BP 113/78 | HR 93 | Temp 98.8°F | Resp 20 | Ht 62.0 in | Wt 114.0 lb

## 2022-10-07 DIAGNOSIS — Z9079 Acquired absence of other genital organ(s): Secondary | ICD-10-CM

## 2022-10-07 DIAGNOSIS — N760 Acute vaginitis: Secondary | ICD-10-CM

## 2022-10-07 DIAGNOSIS — R1084 Generalized abdominal pain: Secondary | ICD-10-CM

## 2022-10-07 DIAGNOSIS — Z90710 Acquired absence of both cervix and uterus: Secondary | ICD-10-CM

## 2022-10-07 DIAGNOSIS — C539 Malignant neoplasm of cervix uteri, unspecified: Secondary | ICD-10-CM

## 2022-10-07 MED ORDER — AMOXICILLIN-POT CLAVULANATE 875-125 MG PO TABS: 1.0000 | ORAL_TABLET | Freq: Two times a day (BID) | ORAL | 0 refills | Status: DC

## 2022-10-07 NOTE — Patient Instructions (Signed)
It was a pleasure to see you in clinic today. - Try a wrist brace at night.  - Return visit planned for after radiation.  Thank you very much for allowing me to provide care for you today.  I appreciate your confidence in choosing our Gynecologic Oncology team at [facility].  If you have any questions about your visit today please call our office or send [place] a MyChart message and we will get back to you as soon as possible.

## 2022-10-07 NOTE — Progress Notes (Unsigned)
Gynecologic Oncology Return Clinic Visit  Date of Service: 10/07/2022 Referring Provider: Christophe Louis, MD  Assessment & Plan: Sarah Morris is a 41 y.o. 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  Postop: - Pt recovering well from surgery and healing appropriately postoperatively. - Ongoing postoperative expectations and precautions reviewed. Continue with no lifting >10lbs through 6 weeks postoperatively. Nothing in the vagina through 8 weeks postop. -Voiding without issue.  Cuff cellulitis:  -Given prior abnormal discharge which has improved but now with some erythema and friability of cuff, and given that patient will be proceeding with adjuvant treatment, will treat for cuff cellulitis. -Additionally, possible pelvic fullness cephalad to cuff on exam, so will obtain CT abdomen/pelvis to rule out postop fluid collection or abscess. - Prescription for Augmentin x10 days sent.  IB2 SCC of the cervix: - Reviewed plan for chemoRT for adjuvant treatment given Sedlis criteria.  Reviewed tumor board recommendations for radiation sensitizing cisplatin in addition to radiation. -Saw Dr. Sondra Come on 10/02/22. Has appointment with Dr. Alvy Bimler tomorrow for discussion of concurrent cisplatin with RT. -Reviewed that I would follow patient closely following adjuvant treatment for signs and symptoms of recurrence.  Carpal tunnel syndrome: - Recommended wrist brace at night. - Not secondary to surgery.  RTC after radiation, unless indicated sooner by CT scan results.  Bernadene Bell, MD Gynecologic Oncology   Medical Decision Making I personally spent  TOTAL 28 minutes face-to-face and non-face-to-face in the care of this patient, which includes all pre, intra, and post visit time on  the date of service.  3 minutes spent reviewing records prior to the visit 20 Minutes in patient contact 5 minutes charting , conferring with consultants etc.    ----------------------- Reason for Visit: Postop/Counseling  Treatment History: Oncology History Overview Note  CPS score 8% for PD-L1   Cervical cancer, FIGO stage IB1 ([place])  05/07/2022 Imaging   IMPRESSION: 1. No acute process identified. 2. Hepatic hemangioma and a subcentimeter hypodensity which may represent cyst or hemangioma. 3. Fibroid uterus.   06/10/2022 Procedure   Pap: HSIL, HPV 16+   07/24/2022 Initial Biopsy      08/23/2022 Imaging   PET:  IMPRESSION: 1. Marked ill-defined cervical FDG avidity consistent with primary cervical neoplasm. 2. No hypermetabolic abdominopelvic adenopathy or convincing evidence of hypermetabolic distant metastatic disease. 3. Very small volume pelvic free fluid does not demonstrate significant abnormal FDG avidity and there is no discrete hypermetabolic peritoneal nodularity, volume is within physiologic normal limits but is new from prior imaging. Suggest attention on follow-up imaging. 4. Scattered nodular areas of hypermetabolic uterine activity most notably involving a nodular focus in the uterine fundus with a max SUV of 4.33, commonly reflecting uterine fibroids. 5. Aortic Atherosclerosis (ICD10-I70.0) and Emphysema (ICD10-J43.9).   09/10/2022 Surgery   Radical abdominal hysterectomy with upper vaginectomy (2cm), bilateral salpingectomy, bilateral pelvic lymphadenectomy (Class C1 radical hysterectomy)    09/10/2022 Cancer Staging   Staging form: Cervix Uteri, AJCC Version 9 - Pathologic stage from 09/10/2022: FIGO Stage IB2 (pT1b2, pN0, cM0) - Signed by Bernadene Bell, MD on 09/23/2022 Histopathologic type: Squamous cell carcinoma, NOS Stage prefix: Initial diagnosis Histologic grade (G): G3 Histologic grading system: 3 grade system Residual tumor (R): R0 -  None Lymph-vascular invasion (LVI): BOTH lymphatic and small vessel AND venous (large vessel) invasion  Pathology Results   FINAL MICROSCOPIC DIAGNOSIS: A. LYMPH NODES, RIGHT PELVIC, RESECTION: - Five lymph nodes with one showing evidence of endosalpingosis, negative for carcinoma (0/5) B. LYMPH NODES, LEFT PELVIC, RESECTION: - Four lymph nodes with some showing evidence of endosalpingosis, negative for carcinoma (0/4) C. ADHERENT OMENTUM, EXCISION: - Portion of omentum, negative for carcinoma D. UTERUS, CERVIX, BILATERAL FALLOPIAN TUBES, UPPER 2CM OF VAGINA, EXCISION: - Invasive moderate to poorly differentiated squamous cell carcinoma, 2.4 cm, involving all quadrants of the cervix - Carcinoma invades into the stroma for a depth of 1.2 cm (of 1.7cm, >50%) - Lymphovascular invasion is identified - Resection margins are negative for carcinoma - Uterus with benign inactive endometrium and benign leiomyomata, up to 2.2 cm - Benign unremarkable bilateral fallopian tubes - Portion of vagina, negative for carcinoma - See oncology table    09/10/2022 Pathology Results      11/22/2022 -  Chemotherapy   Patient is on Treatment Plan : HEAD/NECK Cisplatin (40) q7d       Interval History: Patient reports that her appetite is still decreased.  She is trying to drink ensures 1-2 times a day.  She started this about 3 to 4 days ago.  She notes that she is emptying her bladder without issue and having regular bowel movements without constipation.  She also notes that her sleep is poor.  Her pain has improved and she only has mostly soreness now.  Her discharge that was previously treated with clindamycin she reports is improved and she is finished her antibiotics as prescribed.  She also notes some right hand numbness which comes on during sleep.  It is primarily the digits on the thumb side of her hand and the hand itself.  Past Medical/Surgical History: Past Medical History:  Diagnosis Date    Alcoholism ([place])    Cervical cancer ([place])    Ectopic pregnancy    multiple    Past Surgical History:  Procedure Laterality Date   BILATERAL SALPINGECTOMY N/A 09/10/2022   Procedure: OPEN BILATERAL SALPINGECTOMY;  Surgeon: Bernadene Bell, MD;  Location: [facility];  Service: Gynecology;  Laterality: N/A;   DILATION AND CURETTAGE OF UTERUS     LYMPH NODE DISSECTION N/A 09/10/2022   Procedure: PELVIC LYMPH NODE DISSECTION;  Surgeon: Bernadene Bell, MD;  Location: [facility];  Service: Gynecology;  Laterality: N/A;   RADICAL HYSTERECTOMY N/A 09/10/2022   Procedure: OPEN RADICAL HYSTERECTOMY;  Surgeon: Bernadene Bell, MD;  Location: [facility];  Service: Gynecology;  Laterality: N/A;    Family History  Problem Relation Age of Onset   Hypertension Mother    Diabetes Maternal Grandmother    Cancer Maternal Grandfather    Lung cancer Maternal Grandfather    Cancer Paternal Grandfather    Diabetes Cousin    Stomach cancer Neg Hx    Esophageal cancer Neg Hx    Colon cancer Neg Hx     Social History   Socioeconomic History   Marital status: Single    Spouse name: Not on file   Number of children: Not on file   Years of education: Not on file   Highest education level: Not on file  Occupational History   Occupation: handler  Tobacco Use   Smoking status: Every Day    Packs/day: 0.50    Types: Cigarettes    Start date: 2008   Smokeless tobacco: Never  Vaping Use   Vaping Use: Never used  Substance and Sexual Activity   Alcohol use: Yes  Alcohol/week: 25.0 standard drinks of alcohol    Types: 25 Cans of beer per week    Comment: occassionally   Drug use: Yes    Frequency: 7.0 times per week    Types: Marijuana   Sexual activity: Yes  Other Topics Concern   Not on file  Social History Narrative   Not on file   Social Determinants of Health   Financial Resource Strain: High Risk (08/19/2022)   Overall Financial Resource Strain (CARDIA)    Difficulty of Paying Living  Expenses: Hard  Food Insecurity: Food Insecurity Present (09/12/2022)   Hunger Vital Sign    Worried About Running Out of Food in the Last Year: Often true    Ran Out of Food in the Last Year: Sometimes true  Transportation Needs: No Transportation Needs (08/19/2022)   PRAPARE - Hydrologist (Medical): No    Lack of Transportation (Non-Medical): No  Physical Activity: Not on file  Stress: Not on file  Social Connections: Not on file    Current Medications:  Current Outpatient Medications:    amoxicillin-clavulanate (AUGMENTIN) 875-125 MG tablet, Take 1 tablet by mouth 2 (two) times daily for 10 days., Disp: 20 tablet, Rfl: 0   ibuprofen (ADVIL) 800 MG tablet, Take 1 tablet (800 mg total) by mouth every 8 (eight) hours as needed for moderate pain. For AFTER surgery, Disp: 30 tablet, Rfl: 0   oxyCODONE (OXY IR/ROXICODONE) 5 MG immediate release tablet, Take 1 tablet (5 mg total) by mouth every 4 (four) hours as needed for severe pain. For AFTER surgery only, do not take and drive, Disp: 15 tablet, Rfl: 0   senna-docusate (SENOKOT-S) 8.6-50 MG tablet, Take 2 tablets by mouth at bedtime. For AFTER surgery, do not take if having diarrhea or loose stools, Disp: 30 tablet, Rfl: 0  Review of Symptoms: Complete 10-system review: appetite changes, back pain, hand numbness, weight loss, not sleeping at night.  Physical Exam: BP 113/78 (BP Location: Left Arm, Patient Position: Sitting)   Pulse 93   Temp 98.8 F (37.1 C) (Oral)   Resp 20   Ht _0  (1.575 m)   Wt 114 lb (51.7 kg)   BMI 20.85 kg/m  General: Alert, oriented, no acute distress. HEENT: Normocephalic, atraumatic. Neck symmetric without masses. Sclera anicteric. Chest: Normal work of breathing. Clear to auscultation bilaterally.  Cardiovascular: Regular rate and rhythm, no murmurs. Abdomen: Soft, appropriately nontender.  Normoactive bowel sounds.  No masses or hepatosplenomegaly appreciated.   Well-healing midline incision with surgical glue in place. Extremities: Grossly normal range of motion.  Warm, well perfused.  No edema bilaterally.  With compression of right median nerve, able to reproduce numbness. Skin: No rashes or lesions noted. GU: Normal appearing external genitalia without erythema, excoriation, or lesions.  Speculum exam reveals decreased discharge but some erythema of cuff and some friability.  Otherwise intact vaginal cuff with sutures in place.  Bimanual exam reveals intact vaginal cuff with no notable tenderness.  Possible fullness cephalad to cuff in the right pelvis.  Exam chaperoned by Alfredo Martinez.    Laboratory & Radiologic Studies: FINAL MICROSCOPIC DIAGNOSIS:  A. LYMPH NODES, RIGHT PELVIC, RESECTION: - Five lymph nodes with one showing evidence of endosalpingosis, negative for carcinoma (0/5)  B. LYMPH NODES, LEFT PELVIC, RESECTION: - Four lymph nodes with some showing evidence of endosalpingosis, negative for carcinoma (0/4)  C. ADHERENT OMENTUM, EXCISION: - Portion of omentum, negative for carcinoma  D. UTERUS, CERVIX, BILATERAL FALLOPIAN  TUBES, UPPER 2CM OF VAGINA, EXCISION: - Invasive moderate to poorly differentiated squamous cell carcinoma, 2.4 cm, involving all quadrants of the cervix - Carcinoma invades into the stroma for a depth of 1.2 cm - Lymphovascular invasion is identified - Resection margins are negative for carcinoma - Uterus with benign inactive endometrium and benign leiomyomata, up to 2.2 cm - Benign unremarkable bilateral fallopian tubes - Portion of vagina, negative for carcinoma - See oncology table      ONCOLOGY TABLE:  UTERINE CERVIX, CARCINOMA: Resection  Procedure: Total hysterectomy and bilateral salpingectomy Tumor Size: 2.4 cm Histologic Type: Squamous cell carcinoma, HPV-associated Histologic Grade: G3: Poorly differentiated Stromal Invasion: Present      Depth of stromal invasion (mm): 12 mm Other  Tissue/ Organ: Portion of vagina, negative for carcinoma Margins: All margins negative for invasive carcinoma Margin Status for HSIL or AIS: All margins negative for HSIL or AIS Lymphovascular invasion: Present      Regional Lymph Nodes: Pelvic Lymph Nodes Examined:                                  0 Sentinel                                  9 Non-sentinel                                  9 Total Pelvic Lymph Nodes with Metastasis: 0 Para-aortic Lymph Nodes Examined:                                 0 Sentinel                                 0 Non-sentinel                                 0 Total Distant Metastasis:      Distant sites involved: Not applicable Pathologic Stage Classification (pTNM, AJCC 8th Edition): pT1b2, pN0 Ancillary Studies: Can be performed upon request Representative Tumor Block: D19 Comment(s): None  ([id number])    AMENDMENT NOTE:  The tumor size was indicated as 0.4 cm in the [facility] oncology table. This has been corrected to 2.4 cm.  Additionally, the total cervical stromal thickness is 1.7 cm where depth of tumor invasion is 1.2 cm (>50%).

## 2022-10-08 ENCOUNTER — Encounter: Payer: Self-pay | Admitting: Hematology and Oncology

## 2022-10-08 ENCOUNTER — Encounter: Payer: Self-pay | Admitting: Psychiatry

## 2022-10-08 ENCOUNTER — Inpatient Hospital Stay (HOSPITAL_BASED_OUTPATIENT_CLINIC_OR_DEPARTMENT_OTHER): Payer: Commercial Managed Care - HMO | Admitting: Hematology and Oncology

## 2022-10-08 ENCOUNTER — Other Ambulatory Visit: Payer: Self-pay

## 2022-10-08 ENCOUNTER — Encounter (HOSPITAL_COMMUNITY): Payer: Self-pay

## 2022-10-08 VITALS — BP 127/88 | HR 119 | Resp 18 | Ht 62.0 in | Wt 118.8 lb

## 2022-10-08 DIAGNOSIS — C539 Malignant neoplasm of cervix uteri, unspecified: Secondary | ICD-10-CM

## 2022-10-08 NOTE — Progress Notes (Signed)
START OFF PATHWAY REGIMEN - Other   [id number]:Cisplatin 40 mg/m2 IV D1 q7 Days + RT:   A cycle is every 7 days:     Cisplatin   **Always confirm dose/schedule in your pharmacy ordering system**  Patient Characteristics: Intent of Therapy: Curative Intent, Discussed with Patient 

## 2022-10-08 NOTE — Progress Notes (Signed)
[facility] CONSULT NOTE  Patient Care Team: Dorothyann Gibbs, NP as PCP - General (Gynecologic Oncology)  ASSESSMENT & PLAN:  Cervical cancer, FIGO stage IB1 ([place]) We discussed the role of chemotherapy. The intent is of curative intent.  We discussed some of the risks, benefits, side-effects of cisplatin and its role as chemo sensitizing agent. The plan for weekly cisplatin for x5 doses along with radiation treatment.  Some of the short term side-effects included, though not limited to, including weight loss, life threatening infections, risk of allergic reactions, need for transfusions of blood products, nausea, vomiting, change in bowel habits, loss of hair, admission to hospital for various reasons, and risks of death.   Long term side-effects are also discussed including risks of infertility, permanent damage to nerve function, hearing loss, chronic fatigue, kidney damage with possibility needing hemodialysis, and rare secondary malignancy including bone marrow disorders.  The patient is aware that the response rates discussed earlier is not guaranteed.  After a long discussion, patient made an informed decision to proceed with the prescribed plan of care.   Patient education material was dispensed.  The patient would like to hold off treatment until after the holidays I will order port placement, chemo education class and to start her on treatment starting January 5  Orders Placed This Encounter  Procedures   IR IMAGING GUIDED PORT INSERTION    Standing Status:   Future    Standing Expiration Date:   10/09/2023    Order Specific Question:   Reason for Exam (SYMPTOM  OR DIAGNOSIS REQUIRED)    Answer:   non urgent port, starting chemo on 1/5    Order Specific Question:   Is the patient pregnant?    Answer:   No    Order Specific Question:   Preferred Imaging Location?    Answer:   [facility]   CBC with Differential ([place] Only)    Standing  Status:   Future    Standing Expiration Date:   03/21/6269   Basic Metabolic Panel - [place] Only    Standing Status:   Future    Standing Expiration Date:   11/23/2023   Magnesium    Standing Status:   Future    Standing Expiration Date:   11/23/2023   CBC with Differential ([place] Only)    Standing Status:   Future    Standing Expiration Date:   3/50/0938   Basic Metabolic Panel - [place] Only    Standing Status:   Future    Standing Expiration Date:   11/30/2023   Magnesium    Standing Status:   Future    Standing Expiration Date:   11/30/2023   CBC with Differential ([place] Only)    Standing Status:   Future    Standing Expiration Date:   1/82/9937   Basic Metabolic Panel - [place] Only    Standing Status:   Future    Standing Expiration Date:   12/07/2023   Magnesium    Standing Status:   Future    Standing Expiration Date:   12/07/2023   CBC with Differential ([place] Only)    Standing Status:   Future    Standing Expiration Date:   1/69/6789   Basic Metabolic Panel - [place] Only    Standing Status:   Future    Standing Expiration Date:   12/14/2023   Magnesium    Standing Status:   Future  Standing Expiration Date:   12/14/2023   CBC with Differential ([facility] Only)    Standing Status:   Future    Standing Expiration Date:   12/24/4156   Basic Metabolic Panel - [place] Only    Standing Status:   Future    Standing Expiration Date:   12/21/2023   Magnesium    Standing Status:   Future    Standing Expiration Date:   12/21/2023    The total time spent in the appointment was 60 minutes encounter with patients including review of chart and various tests results, discussions about plan of care and coordination of care plan   All questions were answered. The patient knows to call the clinic with any problems, questions or concerns. No barriers to learning was detected.  Heath Lark, MD 11/21/20233:21 PM  CHIEF  COMPLAINTS/PURPOSE OF CONSULTATION:  Cervical cancer, for further management  HISTORY OF PRESENTING ILLNESS:  Sarah Morris 41 y.o. female is here because of recent diagnosis of cervical cancer She is here accompanied by her significant other She was diagnosed with cervical cancer approximately 2 months ago She underwent surgery and her case was presented at the tumor board Due to high risk disease with LVSI and poorly differentiated pathology, adjuvant therapy was recommended  I have reviewed her chart and materials related to her cancer extensively and collaborated history with the patient. Summary of oncologic history is as follows: Oncology History Overview Note  CPS score 8% for PD-L1   Cervical cancer, FIGO stage IB1 ([place])  05/07/2022 Imaging   IMPRESSION: 1. No acute process identified. 2. Hepatic hemangioma and a subcentimeter hypodensity which may represent cyst or hemangioma. 3. Fibroid uterus.   06/10/2022 Procedure   Pap: HSIL, HPV 16+   07/24/2022 Initial Biopsy      08/23/2022 Imaging   PET:  IMPRESSION: 1. Marked ill-defined cervical FDG avidity consistent with primary cervical neoplasm. 2. No hypermetabolic abdominopelvic adenopathy or convincing evidence of hypermetabolic distant metastatic disease. 3. Very small volume pelvic free fluid does not demonstrate significant abnormal FDG avidity and there is no discrete hypermetabolic peritoneal nodularity, volume is within physiologic normal limits but is new from prior imaging. Suggest attention on follow-up imaging. 4. Scattered nodular areas of hypermetabolic uterine activity most notably involving a nodular focus in the uterine fundus with a max SUV of 4.33, commonly reflecting uterine fibroids. 5. Aortic Atherosclerosis (ICD10-I70.0) and Emphysema (ICD10-J43.9).   09/10/2022 Surgery   Radical abdominal hysterectomy with upper vaginectomy (2cm), bilateral salpingectomy, bilateral pelvic lymphadenectomy  (Class C1 radical hysterectomy)    09/10/2022 Cancer Staging   Staging form: Cervix Uteri, AJCC Version 9 - Pathologic stage from 09/10/2022: FIGO Stage IB2 (pT1b2, pN0, cM0) - Signed by Bernadene Bell, MD on 09/23/2022 Histopathologic type: Squamous cell carcinoma, NOS Stage prefix: Initial diagnosis Histologic grade (G): G3 Histologic grading system: 3 grade system Residual tumor (R): R0 - None Lymph-vascular invasion (LVI): BOTH lymphatic and small vessel AND venous (large vessel) invasion    Pathology Results   FINAL MICROSCOPIC DIAGNOSIS: A. LYMPH NODES, RIGHT PELVIC, RESECTION: - Five lymph nodes with one showing evidence of endosalpingosis, negative for carcinoma (0/5) B. LYMPH NODES, LEFT PELVIC, RESECTION: - Four lymph nodes with some showing evidence of endosalpingosis, negative for carcinoma (0/4) C. ADHERENT OMENTUM, EXCISION: - Portion of omentum, negative for carcinoma D. UTERUS, CERVIX, BILATERAL FALLOPIAN TUBES, UPPER 2CM OF VAGINA, EXCISION: - Invasive moderate to poorly differentiated squamous cell carcinoma, 2.4 cm, involving all quadrants of the  cervix - Carcinoma invades into the stroma for a depth of 1.2 cm (of 1.7cm, >50%) - Lymphovascular invasion is identified - Resection margins are negative for carcinoma - Uterus with benign inactive endometrium and benign leiomyomata, up to 2.2 cm - Benign unremarkable bilateral fallopian tubes - Portion of vagina, negative for carcinoma - See oncology table    09/10/2022 Pathology Results      11/22/2022 -  Chemotherapy   Patient is on Treatment Plan : HEAD/NECK Cisplatin (40) q7d       MEDICAL HISTORY:  Past Medical History:  Diagnosis Date   Alcoholism ([place])    Cervical cancer ([place])    Ectopic pregnancy    multiple    SURGICAL HISTORY: Past Surgical History:  Procedure Laterality Date   BILATERAL SALPINGECTOMY N/A 09/10/2022   Procedure: OPEN BILATERAL SALPINGECTOMY;  Surgeon: Bernadene Bell, MD;   Location: [facility];  Service: Gynecology;  Laterality: N/A;   DILATION AND CURETTAGE OF UTERUS     LYMPH NODE DISSECTION N/A 09/10/2022   Procedure: PELVIC LYMPH NODE DISSECTION;  Surgeon: Bernadene Bell, MD;  Location: [facility];  Service: Gynecology;  Laterality: N/A;   RADICAL HYSTERECTOMY N/A 09/10/2022   Procedure: OPEN RADICAL HYSTERECTOMY;  Surgeon: Bernadene Bell, MD;  Location: [facility];  Service: Gynecology;  Laterality: N/A;    SOCIAL HISTORY: Social History   Socioeconomic History   Marital status: Single    Spouse name: Not on file   Number of children: Not on file   Years of education: Not on file   Highest education level: Not on file  Occupational History   Occupation: handler  Tobacco Use   Smoking status: Every Day    Packs/day: 0.50    Types: Cigarettes    Start date: 2008   Smokeless tobacco: Never  Vaping Use   Vaping Use: Never used  Substance and Sexual Activity   Alcohol use: Yes    Alcohol/week: 25.0 standard drinks of alcohol    Types: 25 Cans of beer per week    Comment: occassionally   Drug use: Yes    Frequency: 7.0 times per week    Types: Marijuana   Sexual activity: Yes  Other Topics Concern   Not on file  Social History Narrative   Not on file   Social Determinants of Health   Financial Resource Strain: High Risk (08/19/2022)   Overall Financial Resource Strain (CARDIA)    Difficulty of Paying Living Expenses: Hard  Food Insecurity: Food Insecurity Present (09/12/2022)   Hunger Vital Sign    Worried About Running Out of Food in the Last Year: Often true    Ran Out of Food in the Last Year: Sometimes true  Transportation Needs: No Transportation Needs (08/19/2022)   PRAPARE - Hydrologist (Medical): No    Lack of Transportation (Non-Medical): No  Physical Activity: Not on file  Stress: Not on file  Social Connections: Not on file  Intimate Partner Violence: Not on file    FAMILY HISTORY: Family  History  Problem Relation Age of Onset   Hypertension Mother    Diabetes Maternal Grandmother    Cancer Maternal Grandfather    Lung cancer Maternal Grandfather    Cancer Paternal Grandfather    Diabetes Cousin    Stomach cancer Neg Hx    Esophageal cancer Neg Hx    Colon cancer Neg Hx     ALLERGIES:  is allergic to black pepper-turmeric, food, bacitracin-polymyxin b, and neosporin [  neomycin-bacitracin zn-polymyx].  MEDICATIONS:  Current Outpatient Medications  Medication Sig Dispense Refill   amoxicillin-clavulanate (AUGMENTIN) 875-125 MG tablet Take 1 tablet by mouth 2 (two) times daily for 10 days. 20 tablet 0   ibuprofen (ADVIL) 800 MG tablet Take 1 tablet (800 mg total) by mouth every 8 (eight) hours as needed for moderate pain. For AFTER surgery 30 tablet 0   oxyCODONE (OXY IR/ROXICODONE) 5 MG immediate release tablet Take 1 tablet (5 mg total) by mouth every 4 (four) hours as needed for severe pain. For AFTER surgery only, do not take and drive 15 tablet 0   senna-docusate (SENOKOT-S) 8.6-50 MG tablet Take 2 tablets by mouth at bedtime. For AFTER surgery, do not take if having diarrhea or loose stools 30 tablet 0   No current facility-administered medications for this visit.    REVIEW OF SYSTEMS: She is healing well after surgery Constitutional: Denies fevers, chills or abnormal night sweats Eyes: Denies blurriness of vision, double vision or watery eyes Ears, nose, mouth, throat, and face: Denies mucositis or sore throat Respiratory: Denies cough, dyspnea or wheezes Cardiovascular: Denies palpitation, chest discomfort or lower extremity swelling Gastrointestinal:  Denies nausea, heartburn or change in bowel habits Skin: Denies abnormal skin rashes Lymphatics: Denies new lymphadenopathy or easy bruising Neurological:Denies numbness, tingling or new weaknesses Behavioral/Psych: Mood is stable, no new changes  All other systems were reviewed with the patient and are  negative.  PHYSICAL EXAMINATION: ECOG PERFORMANCE STATUS: 1 - Symptomatic but completely ambulatory  Vitals:   10/08/22 1301  BP: 127/88  Pulse: (!) 119  Resp: 18  SpO2: 100%   Filed Weights   10/08/22 1301  Weight: 118 lb 12.8 oz (53.9 kg)    GENERAL:alert, no distress and comfortable SKIN: skin color, texture, turgor are normal, no rashes or significant lesions EYES: normal, conjunctiva are pink and non-injected, sclera clear OROPHARYNX:no exudate, no erythema and lips, buccal mucosa, and tongue normal  NECK: supple, thyroid normal size, non-tender, without nodularity LYMPH:  no palpable lymphadenopathy in the cervical, axillary or inguinal LUNGS: clear to auscultation and percussion with normal breathing effort HEART: regular rate & rhythm and no murmurs and no lower extremity edema ABDOMEN:abdomen soft, non-tender and normal bowel sounds Musculoskeletal:no cyanosis of digits and no clubbing  PSYCH: alert & oriented x 3 with fluent speech NEURO: no focal motor/sensory deficits  LABORATORY DATA:  I have reviewed the data as listed Lab Results  Component Value Date   WBC 11.0 (H) 09/11/2022   HGB 11.4 (L) 09/11/2022   HCT 34.4 (L) 09/11/2022   MCV 105.5 (H) 09/11/2022   PLT 211 09/11/2022   Recent Labs    04/29/22 1128 08/28/22 1445 09/11/22 0429  NA 140 137 136  K 3.8 3.9 4.5  CL 105 107 106  CO2 _0 GLUCOSE 94 79 153*  BUN _1 CREATININE 0.93 0.77 0.69  CALCIUM 9.9 9.2 8.7*  GFRNONAA  --  >60 >60  PROT 7.7 8.3*  --   ALBUMIN 4.5 4.6  --   AST 16 21  --   ALT 11 15  --   ALKPHOS 69 62  --   BILITOT 0.7 1.0  --

## 2022-10-08 NOTE — Assessment & Plan Note (Signed)
We discussed the role of chemotherapy. The intent is of curative intent.  We discussed some of the risks, benefits, side-effects of cisplatin and its role as chemo sensitizing agent. The plan for weekly cisplatin for x5 doses along with radiation treatment.  Some of the short term side-effects included, though not limited to, including weight loss, life threatening infections, risk of allergic reactions, need for transfusions of blood products, nausea, vomiting, change in bowel habits, loss of hair, admission to hospital for various reasons, and risks of death.   Long term side-effects are also discussed including risks of infertility, permanent damage to nerve function, hearing loss, chronic fatigue, kidney damage with possibility needing hemodialysis, and rare secondary malignancy including bone marrow disorders.  The patient is aware that the response rates discussed earlier is not guaranteed.  After a long discussion, patient made an informed decision to proceed with the prescribed plan of care.   Patient education material was dispensed.  The patient would like to hold off treatment until after the holidays I will order port placement, chemo education class and to start her on treatment starting January 5

## 2022-10-09 ENCOUNTER — Other Ambulatory Visit: Payer: Self-pay

## 2022-10-15 ENCOUNTER — Ambulatory Visit (HOSPITAL_COMMUNITY): Admission: RE | Admit: 2022-10-15 | Payer: Commercial Managed Care - HMO | Source: Ambulatory Visit

## 2022-10-15 ENCOUNTER — Encounter (HOSPITAL_COMMUNITY): Payer: Self-pay

## 2022-10-15 NOTE — Addendum Note (Signed)
Addended by: Joylene John D on: 10/15/2022 10:11 AM   Modules accepted: Orders

## 2022-10-16 ENCOUNTER — Telehealth: Payer: Self-pay | Admitting: *Deleted

## 2022-10-16 ENCOUNTER — Other Ambulatory Visit: Payer: Self-pay | Admitting: Psychiatry

## 2022-10-16 NOTE — Telephone Encounter (Signed)
Patient's CT denied for a [facility]; facility changed to [facility]. Patient has a STAT walking in appt tomorrow from 9-3

## 2022-10-16 NOTE — Addendum Note (Signed)
Addended by: Joylene John D on: 10/16/2022 12:35 PM   Modules accepted: Orders

## 2022-10-17 ENCOUNTER — Other Ambulatory Visit: Payer: Self-pay | Admitting: Gynecologic Oncology

## 2022-10-17 ENCOUNTER — Other Ambulatory Visit: Payer: Self-pay

## 2022-10-17 ENCOUNTER — Telehealth: Payer: Self-pay | Admitting: Psychiatry

## 2022-10-17 ENCOUNTER — Ambulatory Visit
Admission: RE | Admit: 2022-10-17 | Discharge: 2022-10-17 | Disposition: A | Discharge status: Home / Self Care | Payer: Commercial Managed Care - HMO | Source: Ambulatory Visit | Attending: Gynecologic Oncology | Admitting: Gynecologic Oncology

## 2022-10-17 ENCOUNTER — Telehealth: Payer: Self-pay

## 2022-10-17 ENCOUNTER — Inpatient Hospital Stay: Payer: Commercial Managed Care - HMO

## 2022-10-17 DIAGNOSIS — N739 Female pelvic inflammatory disease, unspecified: Secondary | ICD-10-CM

## 2022-10-17 DIAGNOSIS — C539 Malignant neoplasm of cervix uteri, unspecified: Secondary | ICD-10-CM

## 2022-10-17 DIAGNOSIS — R1084 Generalized abdominal pain: Secondary | ICD-10-CM

## 2022-10-17 LAB — COMPREHENSIVE METABOLIC PANEL
ALT: 19 U/L (ref 0–44)
AST: 17 U/L (ref 15–41)
Albumin: 4.7 g/dL (ref 3.5–5.0)
Alkaline Phosphatase: 70 U/L (ref 38–126)
Anion gap: 7 (ref 5–15)
BUN: 7 mg/dL (ref 6–20)
CO2: 28 mmol/L (ref 22–32)
Calcium: 10 mg/dL (ref 8.9–10.3)
Chloride: 104 mmol/L (ref 98–111)
Creatinine, Ser: 0.76 mg/dL (ref 0.44–1.00)
GFR, Estimated: >60 mL/min (ref >60)
Glucose, Bld: 117 mg/dL — ABNORMAL HIGH (ref 70–99)
Potassium: 4 mmol/L (ref 3.5–5.1)
Sodium: 139 mmol/L (ref 135–145)
Total Bilirubin: 0.5 mg/dL (ref 0.3–1.2)
Total Protein: 8.3 g/dL — ABNORMAL HIGH (ref 6.5–8.1)

## 2022-10-17 LAB — CBC WITH DIFFERENTIAL (CANCER CENTER ONLY)
Abs Immature Granulocytes: 0.03 10*3/uL (ref 0.00–0.07)
Basophils Absolute: 0 10*3/uL (ref 0.0–0.1)
Basophils Relative: 0 %
Eosinophils Absolute: 0.5 10*3/uL (ref 0.0–0.5)
Eosinophils Relative: 5 %
HCT: 37.8 % (ref 36.0–46.0)
Hemoglobin: 12.8 g/dL (ref 12.0–15.0)
Immature Granulocytes: 0 %
Lymphocytes Relative: 25 %
Lymphs Abs: 2.3 10*3/uL (ref 0.7–4.0)
MCH: 34.9 pg — ABNORMAL HIGH (ref 26.0–34.0)
MCHC: 33.9 g/dL (ref 30.0–36.0)
MCV: 103 fL — ABNORMAL HIGH (ref 80.0–100.0)
Monocytes Absolute: 0.4 10*3/uL (ref 0.1–1.0)
Monocytes Relative: 4 %
Neutro Abs: 5.9 10*3/uL (ref 1.7–7.7)
Neutrophils Relative %: 66 %
Platelet Count: 311 10*3/uL (ref 150–400)
RBC: 3.67 MIL/uL — ABNORMAL LOW (ref 3.87–5.11)
RDW: 12.7 % (ref 11.5–15.5)
WBC Count: 9.1 10*3/uL (ref 4.0–10.5)
nRBC: 0 % (ref 0.0–0.2)

## 2022-10-17 MED ORDER — IOPAMIDOL (ISOVUE-300) INJECTION 61%
100.0000 mL | Freq: Once | INTRAVENOUS | Status: AC | PRN
  Administered 2022-10-17: 100 mL via INTRAVENOUS

## 2022-10-17 MED ORDER — AMOXICILLIN-POT CLAVULANATE 875-125 MG PO TABS: 1.0000 | ORAL_TABLET | Freq: Two times a day (BID) | ORAL | 0 refills | Status: AC

## 2022-10-17 NOTE — Telephone Encounter (Signed)
Called pt regarding her CT scan results. 5.9 cm pelvic fluid collection that could represent a pelvic abscess. Also discussed other potential etiologies including urinoma. Have instructed patient that we will want her to come in to get bloodwork completed and IR drainage of fluid collection (send for culture and for creatinine). Will likely prescribe her a few additional days of antibiotics, duration partly to be determined by lab results (in particular WBC). Patient in agreement with the plan. All questions answered.

## 2022-10-17 NOTE — Addendum Note (Signed)
Addended by: Bernadene Bell on: 10/17/2022 01:59 PM   Modules accepted: Orders

## 2022-10-17 NOTE — Telephone Encounter (Signed)
(  See Dr.Newton phone message from today) Pt aware of labs needing to be drawn, she is coming this afternoon and at that time get vitals as well.   Labs drawn, Vital signs are as follows: T: 98.4 B/P: 107/78 P: 82 R:14 O2:100  Dr. Ernestina Patches aware and depending on lab results, abx will be sent to pharmacy.

## 2022-10-17 NOTE — Progress Notes (Signed)
Plan for body fluid creatinine

## 2022-10-17 NOTE — Progress Notes (Signed)
Per Dr. Ernestina Patches, plan for labs today in the office. Order for IR drainage of pelvic abscess ordered as well. Fluid can be sent for culture.

## 2022-10-18 ENCOUNTER — Other Ambulatory Visit: Payer: Self-pay | Admitting: Hematology and Oncology

## 2022-10-21 ENCOUNTER — Encounter: Payer: Self-pay | Admitting: General Practice

## 2022-10-21 ENCOUNTER — Other Ambulatory Visit: Payer: Self-pay | Admitting: Psychiatry

## 2022-10-21 ENCOUNTER — Telehealth: Payer: Self-pay | Admitting: Surgery

## 2022-10-21 DIAGNOSIS — R1084 Generalized abdominal pain: Secondary | ICD-10-CM

## 2022-10-21 DIAGNOSIS — N739 Female pelvic inflammatory disease, unspecified: Secondary | ICD-10-CM

## 2022-10-21 NOTE — Telephone Encounter (Signed)
Patient called for updated on scan scheduling. Informed patient that scan has been put on hold for now and that our office would let her know next steps. While on call patient stated that she is still experiencing discharge but that it hasn't changed. States she is starting to be uncomfortable from the abscess and feels 7/10 pain if she coughs or sneezes, 3/10 at rest. Also having some vaginal itching that began 5-6 days ago. States itching is both on the inside and outside. Denies any symptoms of dysuria, fevers, chills.

## 2022-10-21 NOTE — Progress Notes (Signed)
Per Dr Kathlene Cote in secure chat on 12/4  Not amenable to safe percutaneous drainage currently. Recommend a follow up CT with IV contrast this week.

## 2022-10-21 NOTE — Telephone Encounter (Signed)
Called patient back to let her know that CT scan has been scheduled at [facility] for 12/6 at 8am. Patient instructed to arrive at 7:40am and to pick up constrast at [facility] today or tomorrow morning. Patient verbalized understanding and had no further questions.

## 2022-10-22 ENCOUNTER — Telehealth: Payer: Self-pay | Admitting: Psychiatry

## 2022-10-22 NOTE — Telephone Encounter (Signed)
Called patient to let her know that her insurance denied CT scan scheduled for 12/6 at [facility]. Scan has been put on hold for now. Patient expressed extreme frustration at the repeated delays with getting the fluid drained, stating that she wants this taken care of as soon as possible and the back and forth is extremely frustrating, especially since she cannot drive herself and has to arrange a ride each time something is scheduled. Empathy provided, and patient advised that our office would call her with next steps.

## 2022-10-22 NOTE — Telephone Encounter (Signed)
Called patient with update regarding plan for her pelvic fluid collection.  Given size and location, VIR did not feel like there is a safe window for drainage.  Previously prescribed patient additional antibiotics which she is currently completing.  VIR recommendation was for repeat CT scan to evaluate for change in size and/or safe window for drainage.  Unfortunately repeat CT scan was not approved by [employer].  Peer-to-peer to occur on Friday.  We will update patient sometime next week when we have more information on if repeat CT scan has been approved.  Otherwise, reviewed reassuring findings with patient that her white blood cell count is currently normal and that she has undergone antibiotic treatment which has potentially sterilized this collection if it did in fact originally represent an abscess.  She does note some ongoing vaginal discharge which she does not feel seems infectious as she says it is odorless.  Discussed that it is possible that the collection could be draining through the vaginal cuff.  If so, the collection may continue to resolve on its own.  Otherwise, reassured patient that since she is planning to start her chemoradiation in the new year, at this point in time this should hopefully not impact her ability to complete her recommended adjuvant treatment.  But we are working to try to help resolve things.

## 2022-10-23 ENCOUNTER — Other Ambulatory Visit: Payer: Commercial Managed Care - HMO

## 2022-10-24 ENCOUNTER — Telehealth: Payer: Self-pay | Admitting: Oncology

## 2022-10-24 NOTE — Telephone Encounter (Signed)
Called Sarah Morris and advised her of port placement appointment on 11/20/21 with 9:30 arrival at [facility] admitting.  Gave her instructions for NPO after midnight and to have a driver and someone to stay with her for 24 hours after the procedure.  She verbalized understanding and agreement of appointment and instructions.

## 2022-10-25 ENCOUNTER — Telehealth: Payer: Self-pay

## 2022-10-25 ENCOUNTER — Telehealth: Payer: Self-pay | Admitting: *Deleted

## 2022-10-25 ENCOUNTER — Ambulatory Visit
Admission: RE | Admit: 2022-10-25 | Discharge: 2022-10-25 | Disposition: A | Discharge status: Home / Self Care | Payer: Commercial Managed Care - HMO | Source: Ambulatory Visit | Attending: Gynecologic Oncology | Admitting: Gynecologic Oncology

## 2022-10-25 ENCOUNTER — Other Ambulatory Visit: Payer: Commercial Managed Care - HMO

## 2022-10-25 ENCOUNTER — Other Ambulatory Visit: Payer: Self-pay | Admitting: Gynecologic Oncology

## 2022-10-25 DIAGNOSIS — N739 Female pelvic inflammatory disease, unspecified: Secondary | ICD-10-CM

## 2022-10-25 MED ORDER — IOPAMIDOL (ISOVUE-300) INJECTION 61%
100.0000 mL | Freq: Once | INTRAVENOUS | Status: AC | PRN
  Administered 2022-10-25: 100 mL via INTRAVENOUS

## 2022-10-25 NOTE — Telephone Encounter (Signed)
Spoke with the patient and told her that CT is approved. Explained that she can go to [place] today as a walk in from 9-3

## 2022-10-25 NOTE — Telephone Encounter (Signed)
Lvm for patient to call office regarding CT scan results.   Per Sarah John NP,  Pt aware, via voicemail, fluid collection is smaller. Dr. Ernestina Patches will be contacting patient with recommendation moving forward.

## 2022-10-25 NOTE — Progress Notes (Signed)
Peer to peer performed with Dr. Quay Burow with insurance company. Approval obtained for CT pelvis with contrast for CPT 72193 with Auth# [id number]. Auth team notified. Patient will be contacted to arrange scan.

## 2022-10-28 ENCOUNTER — Telehealth: Payer: Self-pay | Admitting: Surgery

## 2022-10-28 NOTE — Telephone Encounter (Signed)
Patient returned call from Friday. CT results given, (see previous note). Patient states drainage has decreased some but it is still very uncomfortable and she can feel "hard ball" near incision. Patient advised our office will call her with Dr Romona Curls recommendations. Patient verbalized understanding and had no further concerns.

## 2022-10-29 ENCOUNTER — Other Ambulatory Visit: Payer: Self-pay | Admitting: Gynecologic Oncology

## 2022-10-29 ENCOUNTER — Telehealth: Payer: Self-pay | Admitting: *Deleted

## 2022-10-29 DIAGNOSIS — B3731 Acute candidiasis of vulva and vagina: Secondary | ICD-10-CM

## 2022-10-29 MED ORDER — FLUCONAZOLE 100 MG PO TABS: ORAL_TABLET | ORAL | 0 refills | Status: DC

## 2022-10-29 MED ORDER — MICONAZOLE NITRATE 2 % EX CREA: 1.0000 | TOPICAL_CREAM | Freq: Two times a day (BID) | CUTANEOUS | 0 refills | Status: DC

## 2022-10-29 NOTE — Telephone Encounter (Signed)
Patient called and stated "I have been on two rounds of antibiotics and now I believe I have a yeast infection. I have some itching, it started about a week ago. It is getting worse. I have no odor, no discharge, and it's not swollen."   Pharmacy verified

## 2022-10-29 NOTE — Progress Notes (Signed)
See note from [place]. Diflucan sent in for vaginal/vulvar candidiasis.

## 2022-10-29 NOTE — Telephone Encounter (Signed)
Left a message to notify patient that a prescription for Diflucan along with a topical cream was sent to her pharmacy by M. Cross NP. Please call with any questions or further concerns.

## 2022-10-31 ENCOUNTER — Ambulatory Visit (HOSPITAL_COMMUNITY): Payer: Commercial Managed Care - HMO

## 2022-10-31 ENCOUNTER — Encounter: Payer: Self-pay | Admitting: Hematology and Oncology

## 2022-11-04 ENCOUNTER — Telehealth: Payer: Self-pay | Admitting: Surgery

## 2022-11-04 NOTE — Telephone Encounter (Signed)
Patient called stating she is having increased pain and pressure in her L lower abdomen, below her incision site. States pain is constant but much worse with movement such as coughing. The area feels tight. She states she is still having some discharge but it is still less than it was. Denies fevers, vaginal bleeding or any other symptoms. States pain has been increasing slowly over the course of the last week or so and she would like to come in and be evaluated. Patient advised Dr Ernestina Patches would be notified and our office would call her back with any recommendations.

## 2022-11-05 ENCOUNTER — Other Ambulatory Visit: Payer: Self-pay

## 2022-11-05 ENCOUNTER — Inpatient Hospital Stay: Payer: Commercial Managed Care - HMO | Attending: Gynecologic Oncology | Admitting: Gynecologic Oncology

## 2022-11-05 ENCOUNTER — Encounter: Payer: Self-pay | Admitting: Hematology and Oncology

## 2022-11-05 VITALS — BP 120/69 | HR 88 | Temp 97.6°F | Resp 16 | Ht 62.0 in | Wt 117.0 lb

## 2022-11-05 DIAGNOSIS — Z9079 Acquired absence of other genital organ(s): Secondary | ICD-10-CM

## 2022-11-05 DIAGNOSIS — Z90710 Acquired absence of both cervix and uterus: Secondary | ICD-10-CM

## 2022-11-05 DIAGNOSIS — L7682 Other postprocedural complications of skin and subcutaneous tissue: Secondary | ICD-10-CM

## 2022-11-05 NOTE — Patient Instructions (Signed)
No evidence of hernia on today's exam. The knot you are feeling at the lower aspect of your incision on the left is a suture knot. You are able to feel this due to being thin. You can keep a gauze there as extra cushioning and to prevent friction and rubbing.  You can also wear the binder when up moving around to see if this offers additional support to your abdomen.   You can also apply a barrier cream like vaseline to the outside skin on your vulva to help prevent irritation from the discharge you are having.

## 2022-11-05 NOTE — Progress Notes (Signed)
Gynecologic Oncology Symptom Management  Sarah Morris is a 41 y.o. 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 Post-operatively, she developed vaginal cuff cellulitis and had CT imaging on 10/17/2022 revealing a post-op pelvic abscess. At that time, IR felt there was not a safe window for drainage and the patient was placed on oral antibiotics managed outpatient. She had a follow up CT scan on 10/25/2022 showing a decrease in the size of the fluid collection and she was improving clinically.  She presents to the office today for evaluation of increased pain and abdominal pressure to the left lower aspect of her abdominal midline incision. She notices the abdominal discomfort more with movement and coughing. Denies fever, chills, change in appetite, nausea, emesis. Bowels moving without difficulty. Voiding without issue. Still has a small amount of vaginal discharge that has greatly improved in the amount. She has some vulvar irritation related to the discharge.   Patient is alert, oriented, in no acute distress. Appears well. Lungs clear. Heart regular in rate and rhythm. No lower extrem edema noted bilaterally. Abdominal incision is well healed. No surrounding erythema, drainage, or fluctuance. To the lower left aspect of the incision, a suture knot can be felt close to the skin given thinness of the abdomen. This is tender when palpated.  Incisional care discussed. Binder given along with gauze to use as cushioning over the area with the palpable suture knot, between the waistband. She is currently seeing Dr. Sondra Come and Dr. Alvy Bimler for appointments/treatments. She is advised to contact the office for any needs, worsening/persistent symptoms. We will plan on seeing her back in the office at  the completion of treatment.

## 2022-11-05 NOTE — Telephone Encounter (Signed)
Per Dr Ernestina Patches, patient scheduled to see [name], NP today at 1pm. Patient made aware of appointment.

## 2022-11-06 ENCOUNTER — Encounter: Payer: Self-pay | Admitting: Hematology and Oncology

## 2022-11-07 ENCOUNTER — Ambulatory Visit
Admission: RE | Admit: 2022-11-07 | Discharge: 2022-11-07 | Disposition: A | Discharge status: Home / Self Care | Payer: Commercial Managed Care - HMO | Source: Ambulatory Visit | Attending: Radiation Oncology | Admitting: Radiation Oncology

## 2022-11-07 DIAGNOSIS — C539 Malignant neoplasm of cervix uteri, unspecified: Secondary | ICD-10-CM | POA: Diagnosis present

## 2022-11-07 DIAGNOSIS — Z510 Encounter for antineoplastic radiation therapy: Secondary | ICD-10-CM | POA: Insufficient documentation

## 2022-11-07 IMAGING — CT CT ABD-PELV W/ CM
2 of 4 series · 16 of 46 positions shown, 18 images · IV contrast (APPLIED)
Comparison: None Available.

CLINICAL DATA: Epigastric pain and weight loss

EXAM:
CT ABDOMEN AND PELVIS WITH CONTRAST
TECHNIQUE: Multidetector CT imaging of the abdomen and pelvis was performed
using the standard protocol following bolus administration of
intravenous contrast.

[Series 2: axial st · axial · 0.78mm/px · z∈[-558,-218]mm · 13 of 76 slices shown, 15 images]
[im 4/76  soft-tissue]
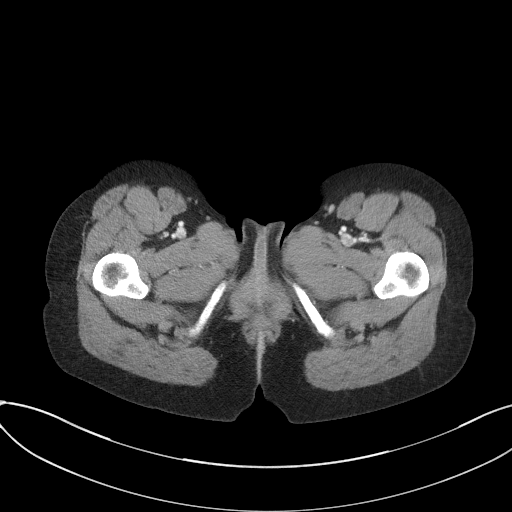
[im 4/76  bone]
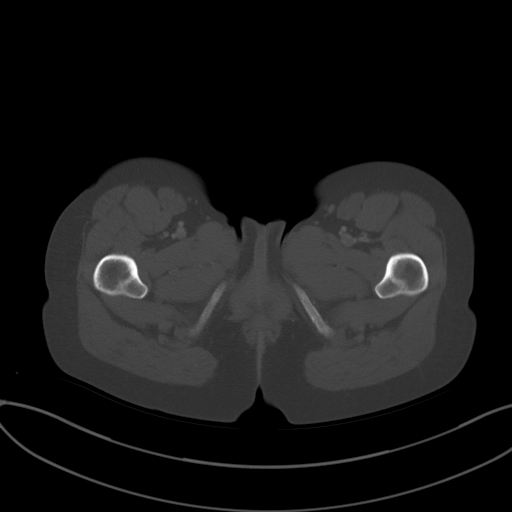
[im 12/76  soft-tissue]
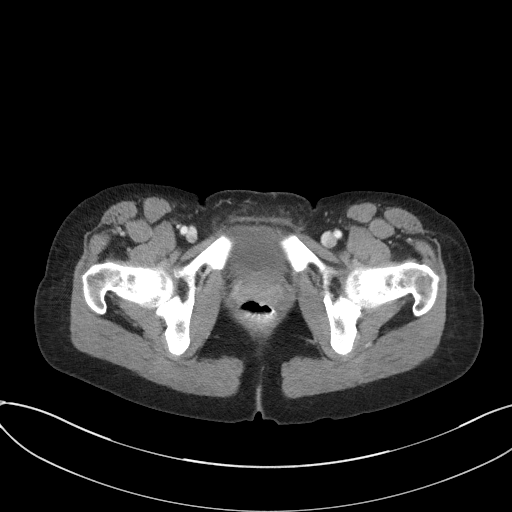
[im 16/76  soft-tissue]
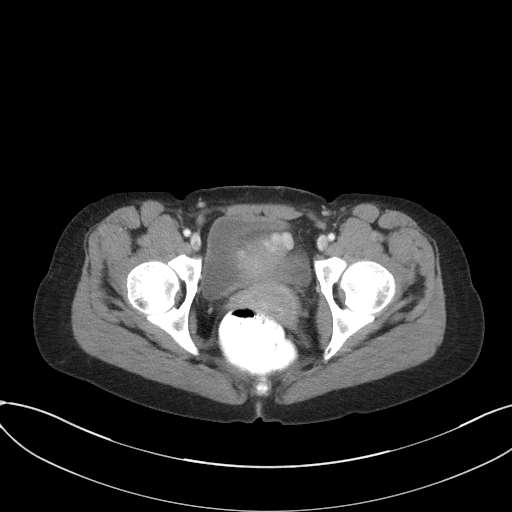
[im 20/76  soft-tissue]
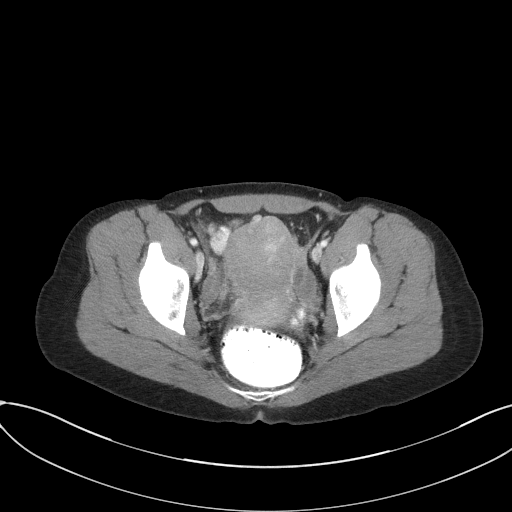
[im 28/76  soft-tissue]
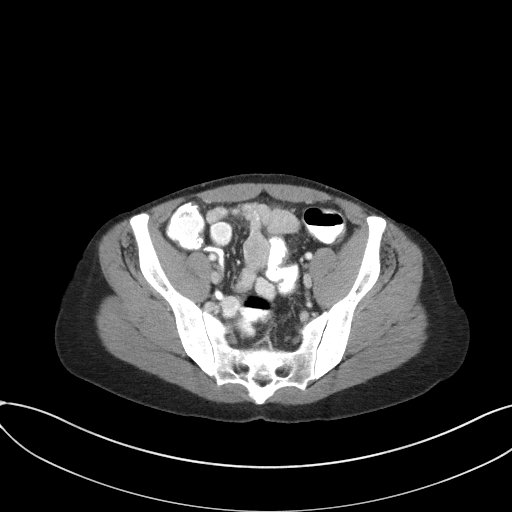
[im 32/76  soft-tissue]
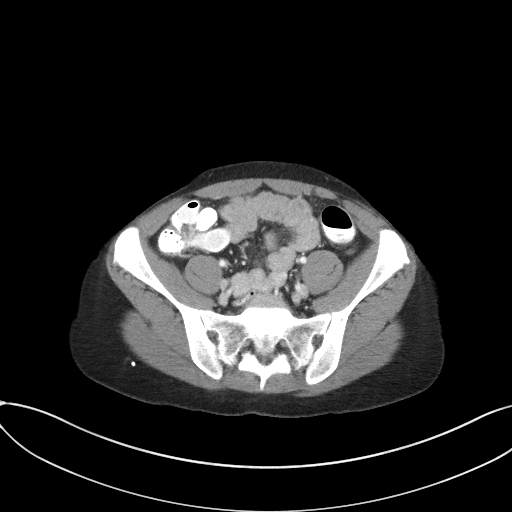
[im 40/76  soft-tissue]
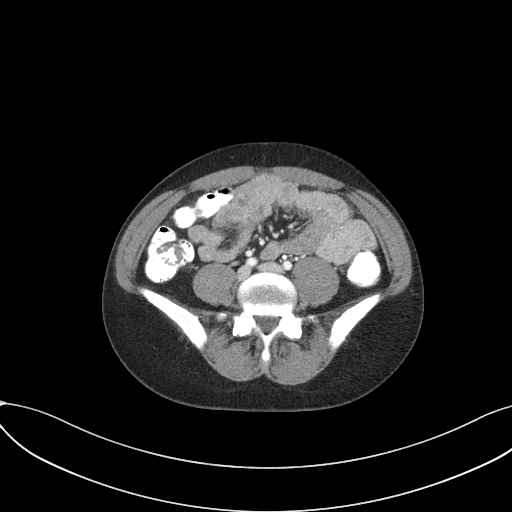
[im 44/76  soft-tissue]
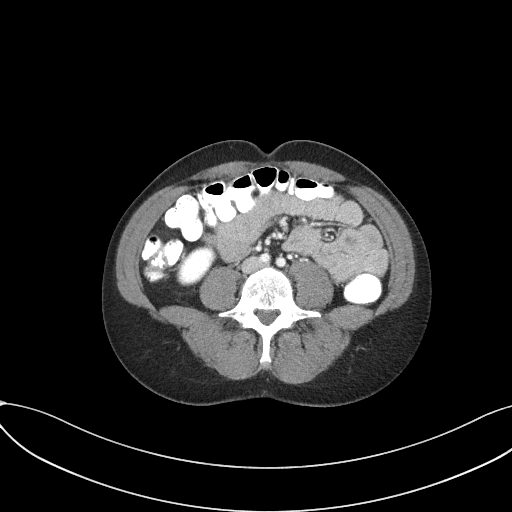
[im 48/76  soft-tissue]
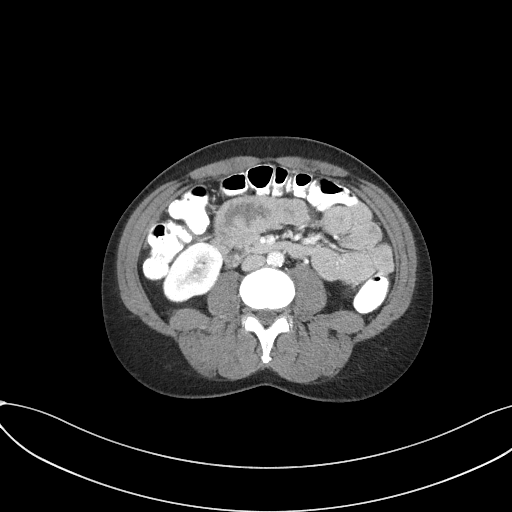
[im 48/76  bone]
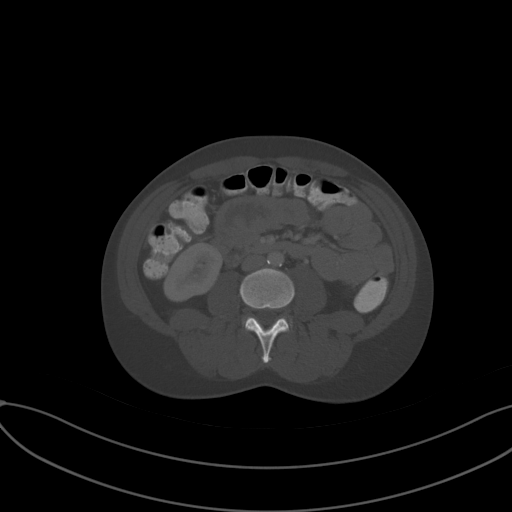
[im 56/76  soft-tissue]
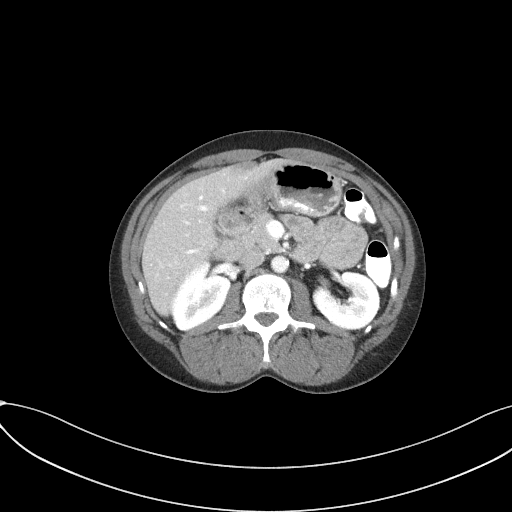
[im 60/76  soft-tissue]
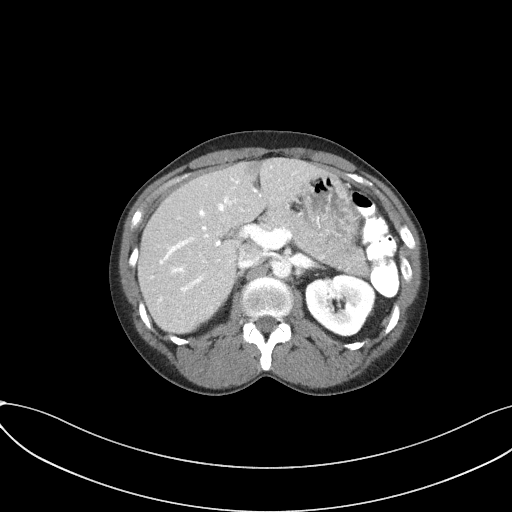
[im 64/76  soft-tissue]
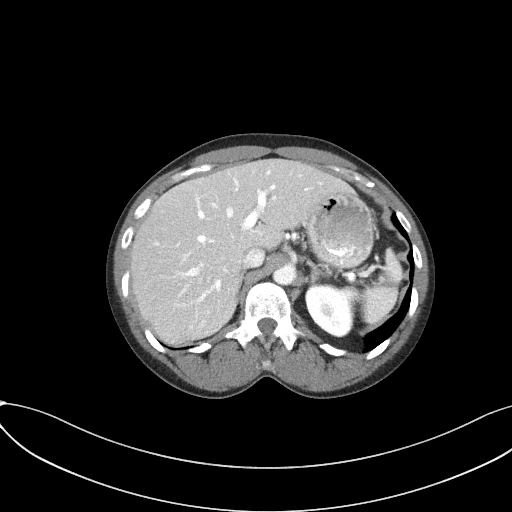
[im 72/76  soft-tissue]
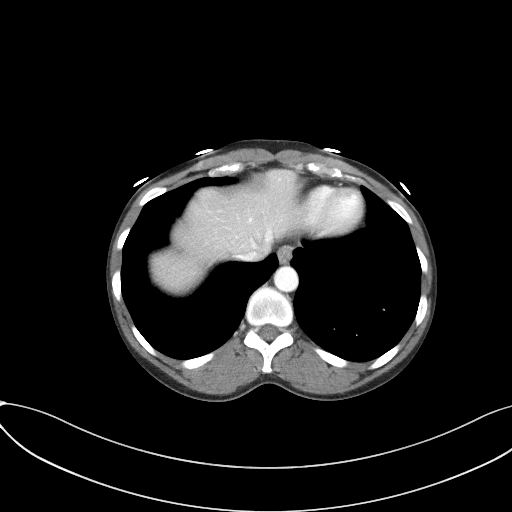

[Series 5: coronal st · coronal · 0.74mm/px · 3 of 84 slices shown]
[im 28/84  soft-tissue]
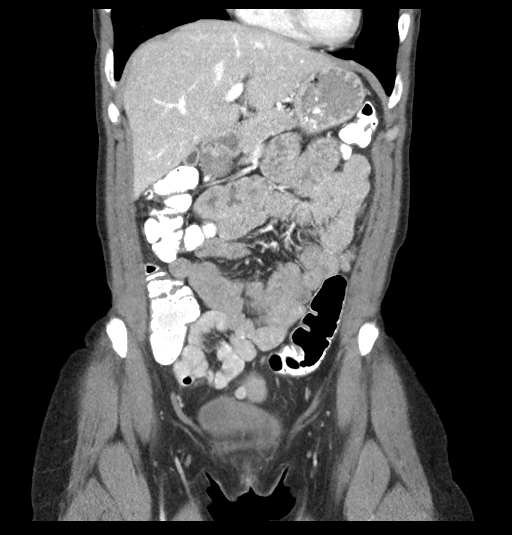
[im 37/84  soft-tissue]
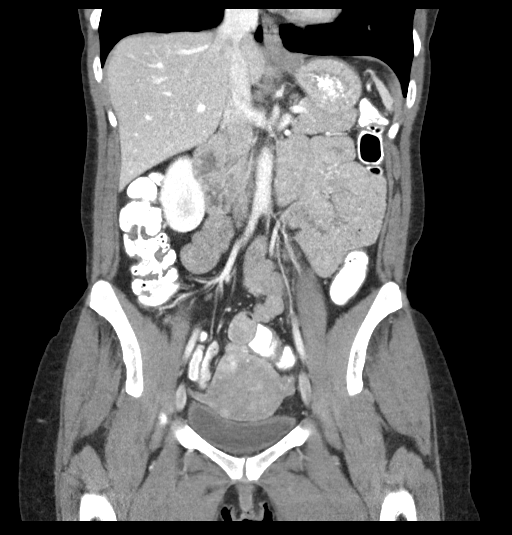
[im 47/84  soft-tissue]
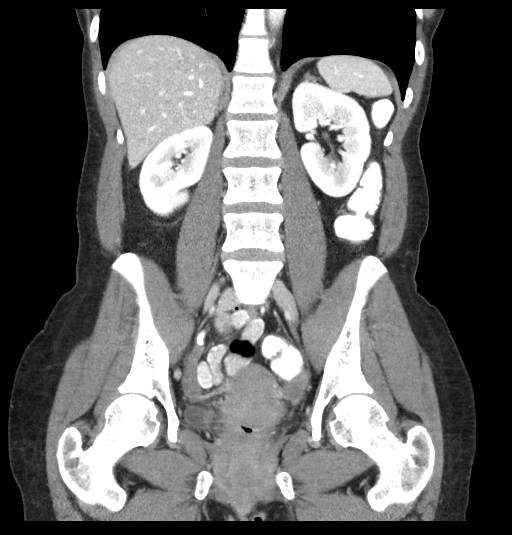

[16 of 46 positions shown; findings below may reference images not displayed]

RADIATION DOSE REDUCTION: This exam was performed according to the
departmental dose-optimization program which includes automated
exposure control, adjustment of the mA and/or kV according to
patient size and/or use of iterative reconstruction technique.

CONTRAST:  100mL OMNIPAQUE IOHEXOL 300 MG/ML  SOLN
FINDINGS: Lower chest: No acute abnormality.

Hepatobiliary: Liver is normal in size and contour. Hypodensity
anteriorly adjacent to the falciform ligament likely represents
focal fatty infiltration. 2.1 cm hemangioma in the central right
hepatic lobe. Subcentimeter hypodensity in segment [DATE] represent a
cyst or hemangioma. Gallbladder appears grossly normal. No biliary
ductal dilatation identified.

Pancreas: Unremarkable. No pancreatic ductal dilatation or
surrounding inflammatory changes.

Spleen: Normal in size without focal abnormality.

Adrenals/Urinary Tract: Adrenal glands are unremarkable. Kidneys are
normal, without renal calculi, focal lesion, or hydronephrosis.
Bladder is unremarkable.

Stomach/Bowel: Stomach is within normal limits. Appendix appears
normal. No evidence of bowel wall thickening, distention, or
inflammatory changes.

Vascular/Lymphatic: Aortic atherosclerosis. No enlarged abdominal or
pelvic lymph nodes.

Reproductive: Heterogeneous uterus with multiple fibroids. No
suspicious adnexal mass identified.

Other: No ascites.

Musculoskeletal: No acute or significant osseous findings.
IMPRESSION: 1. No acute process identified.
2. Hepatic hemangioma and a subcentimeter hypodensity which may
represent cyst or hemangioma.
3. Fibroid uterus.

## 2022-11-08 ENCOUNTER — Other Ambulatory Visit: Payer: Self-pay

## 2022-11-12 ENCOUNTER — Telehealth: Payer: Self-pay | Admitting: Hematology and Oncology

## 2022-11-12 NOTE — Telephone Encounter (Signed)
Rescheduled appointments per room/resource. Patient is aware of the changes made to her upcoming appointments. 

## 2022-11-14 ENCOUNTER — Other Ambulatory Visit: Payer: Commercial Managed Care - HMO

## 2022-11-19 ENCOUNTER — Other Ambulatory Visit (HOSPITAL_COMMUNITY): Payer: Self-pay | Admitting: Student

## 2022-11-19 DIAGNOSIS — C539 Malignant neoplasm of cervix uteri, unspecified: Secondary | ICD-10-CM | POA: Diagnosis not present

## 2022-11-19 DIAGNOSIS — Z510 Encounter for antineoplastic radiation therapy: Secondary | ICD-10-CM | POA: Diagnosis not present

## 2022-11-20 ENCOUNTER — Encounter (HOSPITAL_COMMUNITY): Payer: Self-pay | Admitting: *Deleted

## 2022-11-20 ENCOUNTER — Other Ambulatory Visit: Payer: Self-pay

## 2022-11-20 ENCOUNTER — Ambulatory Visit (HOSPITAL_COMMUNITY)
Admission: RE | Admit: 2022-11-20 | Discharge: 2022-11-20 | Disposition: A | Discharge status: Home / Self Care | Payer: Commercial Managed Care - HMO | Source: Ambulatory Visit | Attending: Hematology and Oncology | Admitting: Hematology and Oncology

## 2022-11-20 DIAGNOSIS — C539 Malignant neoplasm of cervix uteri, unspecified: Secondary | ICD-10-CM | POA: Diagnosis present

## 2022-11-20 DIAGNOSIS — F1020 Alcohol dependence, uncomplicated: Secondary | ICD-10-CM | POA: Diagnosis not present

## 2022-11-20 HISTORY — PX: IR IMAGING GUIDED PORT INSERTION: IMG5740

## 2022-11-20 MED ORDER — HEPARIN SOD (PORK) LOCK FLUSH 100 UNIT/ML IV SOLN
INTRAVENOUS | Status: AC
  Administered 2022-11-20: 5 [IU] via INTRAVENOUS
  Filled 2022-11-20: qty 5

## 2022-11-20 MED ORDER — MIDAZOLAM HCL 2 MG/2ML IJ SOLN
INTRAMUSCULAR | Status: AC
  Filled 2022-11-20: qty 2

## 2022-11-20 MED ORDER — LIDOCAINE HCL 1 % IJ SOLN
INTRAMUSCULAR | Status: AC
  Administered 2022-11-20: 10 mL
  Filled 2022-11-20: qty 20

## 2022-11-20 MED ORDER — SODIUM CHLORIDE 0.9 % IV SOLN: INTRAVENOUS | Status: DC

## 2022-11-20 MED ORDER — FENTANYL CITRATE (PF) 100 MCG/2ML IJ SOLN
INTRAMUSCULAR | Status: AC
  Filled 2022-11-20: qty 2

## 2022-11-20 MED ORDER — FENTANYL CITRATE (PF) 100 MCG/2ML IJ SOLN
INTRAMUSCULAR | Status: AC | PRN
  Administered 2022-11-20 (×2): 50 ug via INTRAVENOUS

## 2022-11-20 MED ORDER — MIDAZOLAM HCL 2 MG/2ML IJ SOLN
INTRAMUSCULAR | Status: AC | PRN
  Administered 2022-11-20 (×2): 1 mg via INTRAVENOUS

## 2022-11-20 NOTE — Discharge Instructions (Signed)

## 2022-11-20 NOTE — Procedures (Signed)
Interventional Radiology Procedure Note  Procedure: Single Lumen Power Port Placement    Access:  Right IJ vein.  Findings: Catheter tip positioned at SVC/RA junction. Port is ready for immediate use.   Complications: None  EBL: < 10 mL  Recommendations:  - Ok to shower in 24 hours - Do not submerge for 7 days - Routine line care   Rudolfo Brandow T. Gabby Rackers, M.D Pager:  [phone]   

## 2022-11-20 NOTE — Consult Note (Signed)
Chief Complaint: Patient was seen in consultation today for port a cath placement  Referring Physician(s): [place]  Supervising Physician: Aletta Edouard  Patient Status: [facility] - Out-pt  History of Present Illness: Sarah Morris is a 42 y.o. female smoker with PMH alcoholism and recently diagnosed cervical cancer, s/p surgery on 09/10/22. She is scheduled today for port a cath placement to assist with treatment.   Past Medical History:  Diagnosis Date   Alcoholism ([place])    Cervical cancer ([place])    Ectopic pregnancy    multiple    Past Surgical History:  Procedure Laterality Date   ABDOMINAL HYSTERECTOMY     BILATERAL SALPINGECTOMY N/A 09/10/2022   Procedure: OPEN BILATERAL SALPINGECTOMY;  Surgeon: Bernadene Bell, MD;  Location: [facility] ORS;  Service: Gynecology;  Laterality: N/A;   DILATION AND CURETTAGE OF UTERUS     LYMPH NODE DISSECTION N/A 09/10/2022   Procedure: PELVIC LYMPH NODE DISSECTION;  Surgeon: Bernadene Bell, MD;  Location: [facility];  Service: Gynecology;  Laterality: N/A;   RADICAL HYSTERECTOMY N/A 09/10/2022   Procedure: OPEN RADICAL HYSTERECTOMY;  Surgeon: Bernadene Bell, MD;  Location: [facility];  Service: Gynecology;  Laterality: N/A;    Allergies: Black pepper-turmeric, Food, Bacitracin-polymyxin b, and Neosporin [neomycin-bacitracin zn-polymyx]  Medications: Prior to Admission medications   Medication Sig Start Date End Date Taking? Authorizing Provider  ibuprofen (ADVIL) 800 MG tablet Take 1 tablet (800 mg total) by mouth every 8 (eight) hours as needed for moderate pain. For AFTER surgery 09/13/22  Yes Joylene John D, NP  fluconazole (DIFLUCAN) 100 MG tablet Take one tablet once and may repeat in 3 days if symptoms not improved 10/29/22   Cross, Melissa D, NP  miconazole (MICATIN) 2 % cream Apply 1 Application topically 2 (two) times daily. To the vulva for itching related to yeast 10/29/22   Cross, [name] D, NP  oxyCODONE (OXY IR/ROXICODONE)  5 MG immediate release tablet Take 1 tablet (5 mg total) by mouth every 4 (four) hours as needed for severe pain. For AFTER surgery only, do not take and drive 32/20/25   Cross, [name] D, NP  senna-docusate (SENOKOT-S) 8.6-50 MG tablet Take 2 tablets by mouth at bedtime. For AFTER surgery, do not take if having diarrhea or loose stools 09/13/22   Joylene John D, NP     Family History  Problem Relation Age of Onset   Hypertension Mother    Diabetes Maternal Grandmother    Cancer Maternal Grandfather    Lung cancer Maternal Grandfather    Cancer Paternal Grandfather    Diabetes Cousin    Stomach cancer Neg Hx    Esophageal cancer Neg Hx    Colon cancer Neg Hx     Social History   Socioeconomic History   Marital status: Single    Spouse name: Not on file   Number of children: Not on file   Years of education: Not on file   Highest education level: Not on file  Occupational History   Occupation: handler  Tobacco Use   Smoking status: Every Day    Packs/day: 0.50    Types: Cigarettes    Start date: 2008   Smokeless tobacco: Never  Vaping Use   Vaping Use: Never used  Substance and Sexual Activity   Alcohol use: Yes    Alcohol/week: 25.0 standard drinks of alcohol    Types: 25 Cans of beer per week    Comment: occassionally   Drug use: Yes    Frequency:  7.0 times per week    Types: Marijuana   Sexual activity: Yes  Other Topics Concern   Not on file  Social History Narrative   Not on file   Social Determinants of Health   Financial Resource Strain: High Risk (08/19/2022)   Overall Financial Resource Strain (CARDIA)    Difficulty of Paying Living Expenses: Hard  Food Insecurity: Food Insecurity Present (09/12/2022)   Hunger Vital Sign    Worried About Running Out of Food in the Last Year: Often true    Ran Out of Food in the Last Year: Sometimes true  Transportation Needs: No Transportation Needs (08/19/2022)   PRAPARE - Hydrologist  (Medical): No    Lack of Transportation (Non-Medical): No  Physical Activity: Not on file  Stress: Not on file  Social Connections: Not on file      Review of Systems currently denies fever,HA,CP,dyspnea, cough, abd/back pain,N/V or bleeding  Vital Signs: BP (!) 123/98   Pulse 85   Temp 98.2 F (36.8 C) (Oral)   Resp 18   Ht '5\' 2"'$  (1.575 m)   Wt 119 lb (54 kg)   LMP 08/27/2022 Comment: Spotted four days, 08-27-22 to 08-31-22;09-10-22 UA preg test negative  SpO2 99%   BMI 21.77 kg/m     Physical Exam awake/alert; chest- CTA bilat; heart- RRR; abd- soft, +BS,NT; no LE edema  Imaging: CT PELVIS W CONTRAST  Result Date: 10/25/2022 CLINICAL DATA:  Follow-up pelvic abscess EXAM: CT PELVIS WITH CONTRAST TECHNIQUE: Multidetector CT imaging of the pelvis was performed using the standard protocol following the bolus administration of intravenous contrast. RADIATION DOSE REDUCTION: This exam was performed according to the departmental dose-optimization program which includes automated exposure control, adjustment of the mA and/or kV according to patient size and/or use of iterative reconstruction technique. CONTRAST:  170m ISOVUE-300 IOPAMIDOL (ISOVUE-300) INJECTION 61% COMPARISON:  CT abdomen and pelvis dated October 17, 2022 FINDINGS: Urinary Tract:  No abnormality visualized. Bowel: Appendix is upper limits of normal in size, measuring 9 mm, unchanged when compared with prior exam. No periappendiceal fat stranding. No bowel wall thickening or evidence of obstruction. Vascular/Lymphatic: No pathologically enlarged lymph nodes. No significant vascular abnormality seen. Reproductive: Postsurgical changes of hysterectomy. Fluid collection of the right adnexa measuring 4.3 x 2.5 cm on series 2, image 21, previously 5.9 x 3.6 cm. Other: Postsurgical changes of the anterior abdominal wall. No pelvic free fluid. Musculoskeletal: No aggressive appearing osseous lesions. IMPRESSION: Postsurgical  changes of hysterectomy. Fluid collection of the located superior and right of the vaginal cuff is decreased in size when compared with prior exam. Electronically Signed   By: LYetta GlassmanM.D.   On: 10/25/2022 14:51    Labs:  CBC: Recent Labs    04/29/22 1128 08/28/22 1445 09/11/22 0429 10/17/22 1302  WBC 7.4 7.2 11.0* 9.1  HGB 13.2 14.2 11.4* 12.8  HCT 38.7 42.9 34.4* 37.8  PLT 289.0 300 211 311    COAGS: Recent Labs    08/28/22 1445  INR 1.0  APTT 30    BMP: Recent Labs    04/29/22 1128 08/28/22 1445 09/11/22 0429 10/17/22 1302  NA 140 137 136 139  K 3.8 3.9 4.5 4.0  CL 105 107 106 104  CO2 '27 23 22 28  '$ GLUCOSE 94 79 153* 117*  BUN '9 11 6 7  '$ CALCIUM 9.9 9.2 8.7* 10.0  CREATININE 0.93 0.77 0.69 0.76  GFRNONAA  --  >60 >60 >60  LIVER FUNCTION TESTS: Recent Labs    04/29/22 1128 08/28/22 1445 10/17/22 1302  BILITOT 0.7 1.0 0.5  AST '16 21 17  '$ ALT '11 15 19  '$ ALKPHOS 69 62 70  PROT 7.7 8.3* 8.3*  ALBUMIN 4.5 4.6 4.7    TUMOR MARKERS: No results for input(s): "AFPTM", "CEA", "CA199", "CHROMGRNA" in the last 8760 hours.  Assessment and Plan: 42 y.o. female smoker with PMH alcoholism and recently diagnosed cervical cancer, s/p surgery on 09/10/22. She is scheduled today for port a cath placement to assist with treatment.Risks and benefits of image guided port-a-catheter placement was discussed with the patient including, but not limited to bleeding, infection, pneumothorax, or fibrin sheath development and need for additional procedures.  All of the patient's questions were answered, patient is agreeable to proceed. Consent signed and in chart.    Thank you for this interesting consult.  I greatly enjoyed meeting SHARLA TANKARD and look forward to participating in their care.  A copy of this report was sent to the requesting provider on this date.  Electronically Signed: D. Rowe Robert, PA-C 11/20/2022, 10:39 AM   I spent a total of  20  minutes   in face to face in clinical consultation, greater than 50% of which was counseling/coordinating care for port a cath placement

## 2022-11-21 ENCOUNTER — Inpatient Hospital Stay: Payer: Commercial Managed Care - HMO

## 2022-11-21 ENCOUNTER — Other Ambulatory Visit: Payer: Self-pay

## 2022-11-21 ENCOUNTER — Encounter: Payer: Self-pay | Admitting: Hematology and Oncology

## 2022-11-21 ENCOUNTER — Inpatient Hospital Stay: Payer: Commercial Managed Care - HMO | Attending: Gynecologic Oncology

## 2022-11-21 ENCOUNTER — Inpatient Hospital Stay: Payer: Commercial Managed Care - HMO | Admitting: Hematology and Oncology

## 2022-11-21 ENCOUNTER — Inpatient Hospital Stay (HOSPITAL_BASED_OUTPATIENT_CLINIC_OR_DEPARTMENT_OTHER): Payer: Commercial Managed Care - HMO | Admitting: Hematology and Oncology

## 2022-11-21 VITALS — BP 112/82 | HR 92 | Temp 98.2°F | Resp 18 | Ht 62.0 in | Wt 119.6 lb

## 2022-11-21 DIAGNOSIS — D1803 Hemangioma of intra-abdominal structures: Secondary | ICD-10-CM | POA: Insufficient documentation

## 2022-11-21 DIAGNOSIS — K1231 Oral mucositis (ulcerative) due to antineoplastic therapy: Secondary | ICD-10-CM | POA: Insufficient documentation

## 2022-11-21 DIAGNOSIS — R197 Diarrhea, unspecified: Secondary | ICD-10-CM | POA: Insufficient documentation

## 2022-11-21 DIAGNOSIS — I700 Atherosclerosis of aorta: Secondary | ICD-10-CM | POA: Insufficient documentation

## 2022-11-21 DIAGNOSIS — D259 Leiomyoma of uterus, unspecified: Secondary | ICD-10-CM | POA: Insufficient documentation

## 2022-11-21 DIAGNOSIS — C539 Malignant neoplasm of cervix uteri, unspecified: Secondary | ICD-10-CM

## 2022-11-21 DIAGNOSIS — Z79899 Other long term (current) drug therapy: Secondary | ICD-10-CM | POA: Insufficient documentation

## 2022-11-21 DIAGNOSIS — Z5111 Encounter for antineoplastic chemotherapy: Secondary | ICD-10-CM | POA: Insufficient documentation

## 2022-11-21 DIAGNOSIS — C538 Malignant neoplasm of overlapping sites of cervix uteri: Secondary | ICD-10-CM | POA: Insufficient documentation

## 2022-11-21 DIAGNOSIS — Z510 Encounter for antineoplastic radiation therapy: Secondary | ICD-10-CM | POA: Diagnosis not present

## 2022-11-21 DIAGNOSIS — J439 Emphysema, unspecified: Secondary | ICD-10-CM | POA: Insufficient documentation

## 2022-11-21 LAB — CBC WITH DIFFERENTIAL (CANCER CENTER ONLY)
Abs Immature Granulocytes: 0.01 10*3/uL (ref 0.00–0.07)
Basophils Absolute: 0 10*3/uL (ref 0.0–0.1)
Basophils Relative: 0 %
Eosinophils Absolute: 0.2 10*3/uL (ref 0.0–0.5)
Eosinophils Relative: 3 %
HCT: 37.7 % (ref 36.0–46.0)
Hemoglobin: 12.6 g/dL (ref 12.0–15.0)
Immature Granulocytes: 0 %
Lymphocytes Relative: 33 %
Lymphs Abs: 2.3 10*3/uL (ref 0.7–4.0)
MCH: 34.2 pg — ABNORMAL HIGH (ref 26.0–34.0)
MCHC: 33.4 g/dL (ref 30.0–36.0)
MCV: 102.4 fL — ABNORMAL HIGH (ref 80.0–100.0)
Monocytes Absolute: 0.5 10*3/uL (ref 0.1–1.0)
Monocytes Relative: 7 %
Neutro Abs: 4 10*3/uL (ref 1.7–7.7)
Neutrophils Relative %: 57 %
Platelet Count: 236 10*3/uL (ref 150–400)
RBC: 3.68 MIL/uL — ABNORMAL LOW (ref 3.87–5.11)
RDW: 12.2 % (ref 11.5–15.5)
WBC Count: 7 10*3/uL (ref 4.0–10.5)
nRBC: 0 % (ref 0.0–0.2)

## 2022-11-21 LAB — BASIC METABOLIC PANEL - CANCER CENTER ONLY
Anion gap: 4 — ABNORMAL LOW (ref 5–15)
BUN: 6 mg/dL (ref 6–20)
CO2: 28 mmol/L (ref 22–32)
Calcium: 9.4 mg/dL (ref 8.9–10.3)
Chloride: 106 mmol/L (ref 98–111)
Creatinine: 0.79 mg/dL (ref 0.44–1.00)
GFR, Estimated: >60 mL/min (ref >60)
Glucose, Bld: 83 mg/dL (ref 70–99)
Potassium: 4 mmol/L (ref 3.5–5.1)
Sodium: 138 mmol/L (ref 135–145)

## 2022-11-21 LAB — MAGNESIUM: Magnesium: 1.6 mg/dL — ABNORMAL LOW (ref 1.7–2.4)

## 2022-11-21 MED ORDER — MAGNESIUM OXIDE -MG SUPPLEMENT 400 (240 MG) MG PO TABS: 400.0000 mg | ORAL_TABLET | Freq: Every day | ORAL | 1 refills | Status: DC

## 2022-11-21 MED ORDER — ONDANSETRON HCL 8 MG PO TABS: 8.0000 mg | ORAL_TABLET | Freq: Three times a day (TID) | ORAL | 1 refills | Status: DC | PRN

## 2022-11-21 MED ORDER — PROCHLORPERAZINE MALEATE 10 MG PO TABS: 10.0000 mg | ORAL_TABLET | Freq: Four times a day (QID) | ORAL | 1 refills | Status: DC | PRN

## 2022-11-21 MED ORDER — LIDOCAINE-PRILOCAINE 2.5-2.5 % EX CREA: TOPICAL_CREAM | CUTANEOUS | 3 refills | Status: DC

## 2022-11-21 MED FILL — Dexamethasone Sodium Phosphate Inj 100 MG/10ML: INTRAMUSCULAR | Qty: 1 | Status: AC

## 2022-11-21 MED FILL — Fosaprepitant Dimeglumine For IV Infusion 150 MG (Base Eq): INTRAVENOUS | Qty: 5 | Status: AC

## 2022-11-21 NOTE — Progress Notes (Signed)
Sarah Morris OFFICE PROGRESS NOTE  Patient Care Team: Dorothyann Gibbs, NP as PCP - General (Gynecologic Oncology)  ASSESSMENT & PLAN:  Cervical cancer, FIGO stage IB1 ([place]) We discussed the role of chemotherapy. The intent is of curative intent.  We discussed some of the risks, benefits, side-effects of cisplatin and its role as chemo sensitizing agent. The plan for weekly cisplatin for x5 doses along with radiation treatment.  Some of the short term side-effects included, though not limited to, including weight loss, life threatening infections, risk of allergic reactions, need for transfusions of blood products, nausea, vomiting, change in bowel habits, loss of hair, admission to hospital for various reasons, and risks of death.   Long term side-effects are also discussed including risks of infertility, permanent damage to nerve function, hearing loss, chronic fatigue, kidney damage with possibility needing hemodialysis, and rare secondary malignancy including bone marrow disorders.  The patient is aware that the response rates discussed earlier is not guaranteed.  After a long discussion, patient made an informed decision to proceed with the prescribed plan of care.   Patient education material was dispensed.  She will start first dose of treatment tomorrow I will see her on a weekly basis for further follow-up  No orders of the defined types were placed in this encounter.   All questions were answered. The patient knows to call the clinic with any problems, questions or concerns. The total time spent in the appointment was 20 minutes encounter with patients including review of chart and various tests results, discussions about plan of care and coordination of care plan   Heath Lark, MD 11/21/2022 10:32 AM  INTERVAL HISTORY: Please see below for problem oriented charting. she returns for treatment follow-up seen prior to cycle 1 of treatment She tolerated port  placement well  REVIEW OF SYSTEMS:   Constitutional: Denies fevers, chills or abnormal weight loss Eyes: Denies blurriness of vision Ears, nose, mouth, throat, and face: Denies mucositis or sore throat Respiratory: Denies cough, dyspnea or wheezes Cardiovascular: Denies palpitation, chest discomfort or lower extremity swelling Gastrointestinal:  Denies nausea, heartburn or change in bowel habits Skin: Denies abnormal skin rashes Lymphatics: Denies new lymphadenopathy or easy bruising Neurological:Denies numbness, tingling or new weaknesses Behavioral/Psych: Mood is stable, no new changes  All other systems were reviewed with the patient and are negative.  I have reviewed the past medical history, past surgical history, social history and family history with the patient and they are unchanged from previous note.  ALLERGIES:  is allergic to black pepper-turmeric, food, bacitracin-polymyxin b, and neosporin [neomycin-bacitracin zn-polymyx].  MEDICATIONS:  Current Outpatient Medications  Medication Sig Dispense Refill   lidocaine-prilocaine (EMLA) cream Apply to affected area once 30 g 3   ondansetron (ZOFRAN) 8 MG tablet Take 1 tablet (8 mg total) by mouth every 8 (eight) hours as needed for nausea or vomiting. Start on the third day after cisplatin. 30 tablet 1   prochlorperazine (COMPAZINE) 10 MG tablet Take 1 tablet (10 mg total) by mouth every 6 (six) hours as needed (Nausea or vomiting). 30 tablet 1   No current facility-administered medications for this visit.    SUMMARY OF ONCOLOGIC HISTORY: Oncology History Overview Note  CPS score 8% for PD-L1   Cervical cancer, FIGO stage IB1 ([place])  05/07/2022 Imaging   IMPRESSION: 1. No acute process identified. 2. Hepatic hemangioma and a subcentimeter hypodensity which may represent cyst or hemangioma. 3. Fibroid uterus.   06/10/2022 Procedure   Pap:  HSIL, HPV 16+   07/24/2022 Initial Biopsy      08/23/2022 Imaging   PET:   IMPRESSION: 1. Marked ill-defined cervical FDG avidity consistent with primary cervical neoplasm. 2. No hypermetabolic abdominopelvic adenopathy or convincing evidence of hypermetabolic distant metastatic disease. 3. Very small volume pelvic free fluid does not demonstrate significant abnormal FDG avidity and there is no discrete hypermetabolic peritoneal nodularity, volume is within physiologic normal limits but is new from prior imaging. Suggest attention on follow-up imaging. 4. Scattered nodular areas of hypermetabolic uterine activity most notably involving a nodular focus in the uterine fundus with a max SUV of 4.33, commonly reflecting uterine fibroids. 5. Aortic Atherosclerosis (ICD10-I70.0) and Emphysema (ICD10-J43.9).   09/10/2022 Surgery   Radical abdominal hysterectomy with upper vaginectomy (2cm), bilateral salpingectomy, bilateral pelvic lymphadenectomy (Class C1 radical hysterectomy)    09/10/2022 Cancer Staging   Staging form: Cervix Uteri, AJCC Version 9 - Pathologic stage from 09/10/2022: FIGO Stage IB2 (pT1b2, pN0, cM0) - Signed by Bernadene Bell, MD on 09/23/2022 Histopathologic type: Squamous cell carcinoma, NOS Stage prefix: Initial diagnosis Histologic grade (G): G3 Histologic grading system: 3 grade system Residual tumor (R): R0 - None Lymph-vascular invasion (LVI): BOTH lymphatic and small vessel AND venous (large vessel) invasion    Pathology Results   FINAL MICROSCOPIC DIAGNOSIS: A. LYMPH NODES, RIGHT PELVIC, RESECTION: - Five lymph nodes with one showing evidence of endosalpingosis, negative for carcinoma (0/5) B. LYMPH NODES, LEFT PELVIC, RESECTION: - Four lymph nodes with some showing evidence of endosalpingosis, negative for carcinoma (0/4) C. ADHERENT OMENTUM, EXCISION: - Portion of omentum, negative for carcinoma D. UTERUS, CERVIX, BILATERAL FALLOPIAN TUBES, UPPER 2CM OF VAGINA, EXCISION: - Invasive moderate to poorly differentiated squamous  cell carcinoma, 2.4 cm, involving all quadrants of the cervix - Carcinoma invades into the stroma for a depth of 1.2 cm (of 1.7cm, >50%) - Lymphovascular invasion is identified - Resection margins are negative for carcinoma - Uterus with benign inactive endometrium and benign leiomyomata, up to 2.2 cm - Benign unremarkable bilateral fallopian tubes - Portion of vagina, negative for carcinoma - See oncology table    09/10/2022 Pathology Results      10/18/2022 Imaging     11/20/2022 Procedure   Placement of single lumen port a cath via right internal jugular vein. The catheter tip lies at the cavo-atrial junction. A power injectable port a cath was placed and is ready for immediate use   11/22/2022 -  Chemotherapy   Patient is on Treatment Plan : Cervical Cisplatin (40) q7d       PHYSICAL EXAMINATION: ECOG PERFORMANCE STATUS: 0 - Asymptomatic  Vitals:   11/21/22 1013  BP: 112/82  Pulse: 92  Resp: 18  Temp: 98.2 F (36.8 C)  SpO2: 100%   Filed Weights   11/21/22 1013  Weight: 119 lb 9.6 oz (54.3 kg)    GENERAL:alert, no distress and comfortable NEURO: alert & oriented x 3 with fluent speech, no focal motor/sensory deficits  LABORATORY DATA:  I have reviewed the data as listed    Component Value Date/Time   NA 138 11/21/2022 1004   K 4.0 11/21/2022 1004   CL 106 11/21/2022 1004   CO2 28 11/21/2022 1004   GLUCOSE 83 11/21/2022 1004   BUN 6 11/21/2022 1004   CREATININE 0.79 11/21/2022 1004   CALCIUM 9.4 11/21/2022 1004   PROT 8.3 (H) 10/17/2022 1302   ALBUMIN 4.7 10/17/2022 1302   AST 17 10/17/2022 1302   ALT 19 10/17/2022 1302  ALKPHOS 70 10/17/2022 1302   BILITOT 0.5 10/17/2022 1302   GFRNONAA >60 11/21/2022 1004   GFRAA >60 06/26/2011 1112    No results found for: "SPEP", "UPEP"  Lab Results  Component Value Date   WBC 7.0 11/21/2022   NEUTROABS 4.0 11/21/2022   HGB 12.6 11/21/2022   HCT 37.7 11/21/2022   MCV 102.4 (H) 11/21/2022   PLT 236  11/21/2022      Chemistry      Component Value Date/Time   NA 138 11/21/2022 1004   K 4.0 11/21/2022 1004   CL 106 11/21/2022 1004   CO2 28 11/21/2022 1004   BUN 6 11/21/2022 1004   CREATININE 0.79 11/21/2022 1004      Component Value Date/Time   CALCIUM 9.4 11/21/2022 1004   ALKPHOS 70 10/17/2022 1302   AST 17 10/17/2022 1302   ALT 19 10/17/2022 1302   BILITOT 0.5 10/17/2022 1302

## 2022-11-21 NOTE — Assessment & Plan Note (Signed)
Cause is unknown I will start her on oral magnesium

## 2022-11-21 NOTE — Assessment & Plan Note (Signed)
We discussed the role of chemotherapy. The intent is of curative intent.  We discussed some of the risks, benefits, side-effects of cisplatin and its role as chemo sensitizing agent. The plan for weekly cisplatin for x5 doses along with radiation treatment.  Some of the short term side-effects included, though not limited to, including weight loss, life threatening infections, risk of allergic reactions, need for transfusions of blood products, nausea, vomiting, change in bowel habits, loss of hair, admission to hospital for various reasons, and risks of death.   Long term side-effects are also discussed including risks of infertility, permanent damage to nerve function, hearing loss, chronic fatigue, kidney damage with possibility needing hemodialysis, and rare secondary malignancy including bone marrow disorders.  The patient is aware that the response rates discussed earlier is not guaranteed.  After a long discussion, patient made an informed decision to proceed with the prescribed plan of care.   Patient education material was dispensed.  She will start first dose of treatment tomorrow I will see her on a weekly basis for further follow-up

## 2022-11-22 ENCOUNTER — Inpatient Hospital Stay: Payer: Commercial Managed Care - HMO

## 2022-11-22 VITALS — BP 111/82 | HR 93 | Temp 98.4°F | Resp 16

## 2022-11-22 DIAGNOSIS — C539 Malignant neoplasm of cervix uteri, unspecified: Secondary | ICD-10-CM

## 2022-11-22 DIAGNOSIS — Z510 Encounter for antineoplastic radiation therapy: Secondary | ICD-10-CM | POA: Diagnosis not present

## 2022-11-22 MED ORDER — HEPARIN SOD (PORK) LOCK FLUSH 100 UNIT/ML IV SOLN
500.0000 [IU] | Freq: Once | INTRAVENOUS | Status: AC | PRN
  Administered 2022-11-22: 500 [IU]

## 2022-11-22 MED ORDER — MAGNESIUM SULFATE 2 GM/50ML IV SOLN
2.0000 g | Freq: Once | INTRAVENOUS | Status: AC
  Administered 2022-11-22: 2 g via INTRAVENOUS
  Filled 2022-11-22: qty 50

## 2022-11-22 MED ORDER — SODIUM CHLORIDE 0.9 % IV SOLN
40.0000 mg/m2 | Freq: Once | INTRAVENOUS | Status: AC
  Administered 2022-11-22: 62 mg via INTRAVENOUS
  Filled 2022-11-22: qty 62

## 2022-11-22 MED ORDER — SODIUM CHLORIDE 0.9 % IV SOLN
150.0000 mg | Freq: Once | INTRAVENOUS | Status: AC
  Administered 2022-11-22: 150 mg via INTRAVENOUS
  Filled 2022-11-22: qty 150

## 2022-11-22 MED ORDER — POTASSIUM CHLORIDE IN NACL 20-0.9 MEQ/L-% IV SOLN
Freq: Once | INTRAVENOUS | Status: AC
  Filled 2022-11-22: qty 1000

## 2022-11-22 MED ORDER — PALONOSETRON HCL INJECTION 0.25 MG/5ML
0.2500 mg | Freq: Once | INTRAVENOUS | Status: AC
  Administered 2022-11-22: 0.25 mg via INTRAVENOUS
  Filled 2022-11-22: qty 5

## 2022-11-22 MED ORDER — SODIUM CHLORIDE 0.9 % IV SOLN: Freq: Once | INTRAVENOUS | Status: AC

## 2022-11-22 MED ORDER — SODIUM CHLORIDE 0.9 % IV SOLN
10.0000 mg | Freq: Once | INTRAVENOUS | Status: AC
  Administered 2022-11-22: 10 mg via INTRAVENOUS
  Filled 2022-11-22: qty 10

## 2022-11-22 MED ORDER — SODIUM CHLORIDE 0.9% FLUSH
10.0000 mL | INTRAVENOUS | Status: DC | PRN
  Administered 2022-11-22: 10 mL

## 2022-11-22 NOTE — Patient Instructions (Signed)
[facility]  Discharge Instructions: Thank you for choosing [facility] to provide your oncology and hematology care.   If you have a lab appointment with the [facility], please go directly to the [facility] and check in at the registration area.   Wear comfortable clothing and clothing appropriate for easy access to any Portacath or PICC line.   We strive to give you quality time with your provider. You may need to reschedule your appointment if you arrive late (15 or more minutes).  Arriving late affects you and other patients whose appointments are after yours.  Also, if you miss three or more appointments without notifying the office, you may be dismissed from the clinic at the provider's discretion.      For prescription refill requests, have your pharmacy contact our office and allow 72 hours for refills to be completed.    Today you received the following chemotherapy and/or immunotherapy agents Cisplatin       To help prevent nausea and vomiting after your treatment, we encourage you to take your nausea medication as directed.  BELOW ARE SYMPTOMS THAT SHOULD BE REPORTED IMMEDIATELY: *FEVER GREATER THAN 100.4 F (38 C) OR HIGHER *CHILLS OR SWEATING *NAUSEA AND VOMITING THAT IS NOT CONTROLLED WITH YOUR NAUSEA MEDICATION *UNUSUAL SHORTNESS OF BREATH *UNUSUAL BRUISING OR BLEEDING *URINARY PROBLEMS (pain or burning when urinating, or frequent urination) *BOWEL PROBLEMS (unusual diarrhea, constipation, pain near the anus) TENDERNESS IN MOUTH AND THROAT WITH OR WITHOUT PRESENCE OF ULCERS (sore throat, sores in mouth, or a toothache) UNUSUAL RASH, SWELLING OR PAIN  UNUSUAL VAGINAL DISCHARGE OR ITCHING   Items with * indicate a potential emergency and should be followed up as soon as possible or go to the Emergency Department if any problems should occur.  Please show the CHEMOTHERAPY ALERT CARD or IMMUNOTHERAPY ALERT CARD at check-in to  the Emergency Department and triage nurse.  Should you have questions after your visit or need to cancel or reschedule your appointment, please contact [facility]  Dept: [phone]  and follow the prompts.  Office hours are 8:00 a.m. to 4:30 p.m. Monday - Friday. Please note that voicemails left after 4:00 p.m. may not be returned until the following business day.  We are closed weekends and major holidays. You have access to a nurse at all times for urgent questions. Please call the main number to the clinic Dept: [phone] and follow the prompts.   For any non-urgent questions, you may also contact your provider using MyChart. We now offer e-Visits for anyone 70 and older to request care online for non-urgent symptoms. For details visit [email or web address].   Also download the MyChart app! Go to the app store, search "MyChart", open the app, select [facility], and log in with your MyChart username and password.  Cisplatin Injection What is this medication? CISPLATIN (SIS pla tin) treats some types of cancer. It works by slowing down the growth of cancer cells. This medicine may be used for other purposes; ask your health care provider or pharmacist if you have questions. COMMON BRAND NAME(S): Platinol, Platinol -AQ What should I tell my care team before I take this medication? They need to know if you have any of these conditions: Eye disease, vision problems Hearing problems Kidney disease Low blood counts, such as low white cells, platelets, or red blood cells Tingling of the fingers or toes, or other nerve disorder An unusual or allergic reaction  to cisplatin, carboplatin, oxaliplatin, other medications, foods, dyes, or preservatives If you or your partner are pregnant or trying to get pregnant Breast-feeding How should I use this medication? This medication is injected into a vein. It is given by your care team in a hospital or clinic  setting. Talk to your care team about the use of this medication in children. Special care may be needed. Overdosage: If you think you have taken too much of this medicine contact a poison control center or emergency room at once. NOTE: This medicine is only for you. Do not share this medicine with others. What if I miss a dose? Keep appointments for follow-up doses. It is important not to miss your dose. Call your care team if you are unable to keep an appointment. What may interact with this medication? Do not take this medication with any of the following: Live virus vaccines This medication may also interact with the following: Certain antibiotics, such as amikacin, gentamicin, neomycin, polymyxin B, streptomycin, tobramycin, vancomycin Foscarnet This list may not describe all possible interactions. Give your health care provider a list of all the medicines, herbs, non-prescription drugs, or dietary supplements you use. Also tell them if you smoke, drink alcohol, or use illegal drugs. Some items may interact with your medicine. What should I watch for while using this medication? Your condition will be monitored carefully while you are receiving this medication. You may need blood work done while taking this medication. This medication may make you feel generally unwell. This is not uncommon, as chemotherapy can affect healthy cells as well as cancer cells. Report any side effects. Continue your course of treatment even though you feel ill unless your care team tells you to stop. This medication may increase your risk of getting an infection. Call your care team for advice if you get a fever, chills, sore throat, or other symptoms of a cold or flu. Do not treat yourself. Try to avoid being around people who are sick. Avoid taking medications that contain aspirin, acetaminophen, ibuprofen, naproxen, or ketoprofen unless instructed by your care team. These medications may hide a fever. This  medication may increase your risk to bruise or bleed. Call your care team if you notice any unusual bleeding. Be careful brushing or flossing your teeth or using a toothpick because you may get an infection or bleed more easily. If you have any dental work done, tell your dentist you are receiving this medication. Drink fluids as directed while you are taking this medication. This will help protect your kidneys. Call your care team if you get diarrhea. Do not treat yourself. Talk to your care team if you or your partner wish to become pregnant or think you might be pregnant. This medication can cause serious birth defects if taken during pregnancy and for 14 months after the last dose. A negative pregnancy test is required before starting this medication. A reliable form of contraception is recommended while taking this medication and for 14 months after the last dose. Talk to your care team about effective forms of contraception. Do not father a child while taking this medication and for 11 months after the last dose. Use a condom during sex during this time period. Do not breast-feed while taking this medication. This medication may cause infertility. Talk to your care team if you are concerned about your fertility. What side effects may I notice from receiving this medication? Side effects that you should report to your care team as soon  as possible: Allergic reactions--skin rash, itching, hives, swelling of the face, lips, tongue, or throat Eye pain, change in vision, vision loss Hearing loss, ringing in ears Infection--fever, chills, cough, sore throat, wounds that don't heal, pain or trouble when passing urine, general feeling of discomfort or being unwell Kidney injury--decrease in the amount of urine, swelling of the ankles, hands, or feet Low red blood cell level--unusual weakness or fatigue, dizziness, headache, trouble breathing Painful swelling, warmth, or redness of the skin, blisters or  sores at the infusion site Pain, tingling, or numbness in the hands or feet Unusual bruising or bleeding Side effects that usually do not require medical attention (report to your care team if they continue or are bothersome): Hair loss Nausea Vomiting This list may not describe all possible side effects. Call your doctor for medical advice about side effects. You may report side effects to FDA at 1-800-FDA-1088. Where should I keep my medication? This medication is given in a hospital or clinic. It will not be stored at home. NOTE: This sheet is a summary. It may not cover all possible information. If you have questions about this medicine, talk to your doctor, pharmacist, or health care provider.  2023 Elsevier/Gold Standard (2022-03-01 00:00:00)

## 2022-11-25 ENCOUNTER — Ambulatory Visit
Admission: RE | Admit: 2022-11-25 | Discharge: 2022-11-25 | Disposition: A | Discharge status: Home / Self Care | Payer: Commercial Managed Care - HMO | Source: Ambulatory Visit | Attending: Radiation Oncology | Admitting: Radiation Oncology

## 2022-11-25 ENCOUNTER — Other Ambulatory Visit: Payer: Self-pay

## 2022-11-25 DIAGNOSIS — C539 Malignant neoplasm of cervix uteri, unspecified: Secondary | ICD-10-CM

## 2022-11-25 DIAGNOSIS — Z510 Encounter for antineoplastic radiation therapy: Secondary | ICD-10-CM | POA: Diagnosis not present

## 2022-11-25 LAB — RAD ONC ARIA SESSION SUMMARY
Course Elapsed Days: 0
Plan Fractions Treated to Date: 1
Plan Prescribed Dose Per Fraction: 1.8 Gy
Plan Total Fractions Prescribed: 25
Plan Total Prescribed Dose: 45 Gy
Reference Point Dosage Given to Date: 1.8 Gy
Reference Point Session Dosage Given: 1.8 Gy
Session Number: 1

## 2022-11-26 ENCOUNTER — Ambulatory Visit
Admission: RE | Admit: 2022-11-26 | Discharge: 2022-11-26 | Disposition: A | Discharge status: Home / Self Care | Payer: Commercial Managed Care - HMO | Source: Ambulatory Visit | Attending: Radiation Oncology | Admitting: Radiation Oncology

## 2022-11-26 ENCOUNTER — Other Ambulatory Visit: Payer: Self-pay

## 2022-11-26 DIAGNOSIS — Z510 Encounter for antineoplastic radiation therapy: Secondary | ICD-10-CM | POA: Diagnosis not present

## 2022-11-26 LAB — RAD ONC ARIA SESSION SUMMARY
Course Elapsed Days: 1
Plan Fractions Treated to Date: 2
Plan Prescribed Dose Per Fraction: 1.8 Gy
Plan Total Fractions Prescribed: 25
Plan Total Prescribed Dose: 45 Gy
Reference Point Dosage Given to Date: 3.6 Gy
Reference Point Session Dosage Given: 1.8 Gy
Session Number: 2

## 2022-11-27 ENCOUNTER — Ambulatory Visit
Admission: RE | Admit: 2022-11-27 | Discharge: 2022-11-27 | Disposition: A | Discharge status: Home / Self Care | Payer: Commercial Managed Care - HMO | Source: Ambulatory Visit | Attending: Radiation Oncology | Admitting: Radiation Oncology

## 2022-11-27 ENCOUNTER — Other Ambulatory Visit: Payer: Self-pay

## 2022-11-27 ENCOUNTER — Encounter: Payer: Self-pay | Admitting: Hematology and Oncology

## 2022-11-27 ENCOUNTER — Inpatient Hospital Stay: Payer: Commercial Managed Care - HMO

## 2022-11-27 DIAGNOSIS — Z510 Encounter for antineoplastic radiation therapy: Secondary | ICD-10-CM | POA: Diagnosis not present

## 2022-11-27 LAB — RAD ONC ARIA SESSION SUMMARY
Course Elapsed Days: 2
Plan Fractions Treated to Date: 3
Plan Prescribed Dose Per Fraction: 1.8 Gy
Plan Total Fractions Prescribed: 25
Plan Total Prescribed Dose: 45 Gy
Reference Point Dosage Given to Date: 5.4 Gy
Reference Point Session Dosage Given: 1.8 Gy
Session Number: 3

## 2022-11-27 NOTE — Progress Notes (Signed)
Called pt to introduce myself as her Financial Resource Specialist and to discuss the [employer] grant.  I left a msg requesting she return my call if she's interested in applying for the grant.  ?

## 2022-11-28 ENCOUNTER — Inpatient Hospital Stay: Payer: Commercial Managed Care - HMO

## 2022-11-28 ENCOUNTER — Other Ambulatory Visit: Payer: Self-pay

## 2022-11-28 ENCOUNTER — Ambulatory Visit
Admission: RE | Admit: 2022-11-28 | Discharge: 2022-11-28 | Disposition: A | Discharge status: Home / Self Care | Payer: Commercial Managed Care - HMO | Source: Ambulatory Visit | Attending: Radiation Oncology | Admitting: Radiation Oncology

## 2022-11-28 ENCOUNTER — Other Ambulatory Visit (HOSPITAL_COMMUNITY): Payer: Self-pay

## 2022-11-28 ENCOUNTER — Inpatient Hospital Stay (HOSPITAL_BASED_OUTPATIENT_CLINIC_OR_DEPARTMENT_OTHER): Payer: Commercial Managed Care - HMO | Admitting: Hematology and Oncology

## 2022-11-28 ENCOUNTER — Encounter: Payer: Self-pay | Admitting: Hematology and Oncology

## 2022-11-28 ENCOUNTER — Inpatient Hospital Stay: Payer: Commercial Managed Care - HMO | Admitting: Gynecologic Oncology

## 2022-11-28 VITALS — BP 128/91 | HR 75 | Resp 18 | Ht 62.0 in | Wt 118.2 lb

## 2022-11-28 DIAGNOSIS — K1231 Oral mucositis (ulcerative) due to antineoplastic therapy: Secondary | ICD-10-CM | POA: Insufficient documentation

## 2022-11-28 DIAGNOSIS — C539 Malignant neoplasm of cervix uteri, unspecified: Secondary | ICD-10-CM

## 2022-11-28 DIAGNOSIS — Z510 Encounter for antineoplastic radiation therapy: Secondary | ICD-10-CM | POA: Diagnosis not present

## 2022-11-28 DIAGNOSIS — L7682 Other postprocedural complications of skin and subcutaneous tissue: Secondary | ICD-10-CM | POA: Insufficient documentation

## 2022-11-28 LAB — CBC WITH DIFFERENTIAL (CANCER CENTER ONLY)
Abs Immature Granulocytes: 0.02 10*3/uL (ref 0.00–0.07)
Basophils Absolute: 0 10*3/uL (ref 0.0–0.1)
Basophils Relative: 0 %
Eosinophils Absolute: 0.1 10*3/uL (ref 0.0–0.5)
Eosinophils Relative: 2 %
HCT: 40.5 % (ref 36.0–46.0)
Hemoglobin: 13.5 g/dL (ref 12.0–15.0)
Immature Granulocytes: 0 %
Lymphocytes Relative: 20 %
Lymphs Abs: 1.4 10*3/uL (ref 0.7–4.0)
MCH: 33.7 pg (ref 26.0–34.0)
MCHC: 33.3 g/dL (ref 30.0–36.0)
MCV: 101 fL — ABNORMAL HIGH (ref 80.0–100.0)
Monocytes Absolute: 0.4 10*3/uL (ref 0.1–1.0)
Monocytes Relative: 6 %
Neutro Abs: 4.9 10*3/uL (ref 1.7–7.7)
Neutrophils Relative %: 72 %
Platelet Count: 278 10*3/uL (ref 150–400)
RBC: 4.01 MIL/uL (ref 3.87–5.11)
RDW: 12.2 % (ref 11.5–15.5)
WBC Count: 6.8 10*3/uL (ref 4.0–10.5)
nRBC: 0 % (ref 0.0–0.2)

## 2022-11-28 LAB — BASIC METABOLIC PANEL - CANCER CENTER ONLY
Anion gap: 5 (ref 5–15)
BUN: 9 mg/dL (ref 6–20)
CO2: 30 mmol/L (ref 22–32)
Calcium: 9.9 mg/dL (ref 8.9–10.3)
Chloride: 101 mmol/L (ref 98–111)
Creatinine: 0.74 mg/dL (ref 0.44–1.00)
GFR, Estimated: >60 mL/min (ref >60)
Glucose, Bld: 85 mg/dL (ref 70–99)
Potassium: 4.3 mmol/L (ref 3.5–5.1)
Sodium: 136 mmol/L (ref 135–145)

## 2022-11-28 LAB — RAD ONC ARIA SESSION SUMMARY
Course Elapsed Days: 3
Plan Fractions Treated to Date: 4
Plan Prescribed Dose Per Fraction: 1.8 Gy
Plan Total Fractions Prescribed: 25
Plan Total Prescribed Dose: 45 Gy
Reference Point Dosage Given to Date: 7.2 Gy
Reference Point Session Dosage Given: 1.8 Gy
Session Number: 4

## 2022-11-28 LAB — MAGNESIUM: Magnesium: 1.9 mg/dL (ref 1.7–2.4)

## 2022-11-28 MED ORDER — NYSTATIN 100000 UNIT/ML MT SUSP
5.0000 mL | Freq: Four times a day (QID) | OROMUCOSAL | 0 refills | Status: DC
  Filled 2022-11-28: qty 140, 7d supply, fill #0

## 2022-11-28 MED FILL — Fosaprepitant Dimeglumine For IV Infusion 150 MG (Base Eq): INTRAVENOUS | Qty: 5 | Status: AC

## 2022-11-28 MED FILL — Dexamethasone Sodium Phosphate Inj 100 MG/10ML: INTRAMUSCULAR | Qty: 1 | Status: AC

## 2022-11-28 NOTE — Assessment & Plan Note (Signed)
Likely due to treatment Recommend Magic mouthwash with lidocaine swish and spit 4 times a day

## 2022-11-28 NOTE — Assessment & Plan Note (Signed)
Recommend conservative approach with topical lidocaine

## 2022-11-28 NOTE — Assessment & Plan Note (Signed)
This has resolved Observe closely She will continue oral magnesium

## 2022-11-28 NOTE — Assessment & Plan Note (Signed)
She tolerated treatment well except for mucositis and incisional pain Her counts are normal and hypomagnesemia has resolved She will continue chemotherapy as scheduled along with radiation treatment I will see her again next week for further follow-up

## 2022-11-28 NOTE — Progress Notes (Signed)
[facility] OFFICE PROGRESS NOTE  Patient Care Team: Dorothyann Gibbs, NP as PCP - General (Gynecologic Oncology)  ASSESSMENT & PLAN:  Cervical cancer, FIGO stage IB1 ([place]) She tolerated treatment well except for mucositis and incisional pain Her counts are normal and hypomagnesemia has resolved She will continue chemotherapy as scheduled along with radiation treatment I will see her again next week for further follow-up  Hypomagnesemia This has resolved Observe closely She will continue oral magnesium  Mucositis due to antineoplastic therapy Likely due to treatment Recommend Magic mouthwash with lidocaine swish and spit 4 times a day  Incisional pain Recommend conservative approach with topical lidocaine  Orders Placed This Encounter  Procedures   CBC with Differential ([facility] Only)    Standing Status:   Standing    Number of Occurrences:   5    Standing Expiration Date:   1/95/0932   Basic Metabolic Panel - [facility] Only    Standing Status:   Standing    Number of Occurrences:   5    Standing Expiration Date:   11/29/2023   Magnesium    Standing Status:   Standing    Number of Occurrences:   5    Standing Expiration Date:   11/29/2023    All questions were answered. The patient knows to call the clinic with any problems, questions or concerns. The total time spent in the appointment was 30 minutes encounter with patients including review of chart and various tests results, discussions about plan of care and coordination of care plan   Heath Lark, MD 11/28/2022 2:21 PM  INTERVAL HISTORY: Please see below for problem oriented charting. she returns for treatment follow-up seen prior to cycle 2 of treatment She is complaining of significant pain near her incision No recent fever, chills or drainage She developed some mucositis but no nausea or vomiting  REVIEW OF SYSTEMS:   Constitutional: Denies fevers, chills or abnormal weight loss Eyes:  Denies blurriness of vision Respiratory: Denies cough, dyspnea or wheezes Cardiovascular: Denies palpitation, chest discomfort or lower extremity swelling Gastrointestinal:  Denies nausea, heartburn or change in bowel habits Skin: Denies abnormal skin rashes Lymphatics: Denies new lymphadenopathy or easy bruising Neurological:Denies numbness, tingling or new weaknesses Behavioral/Psych: Mood is stable, no new changes  All other systems were reviewed with the patient and are negative.  I have reviewed the past medical history, past surgical history, social history and family history with the patient and they are unchanged from previous note.  ALLERGIES:  is allergic to black pepper-turmeric, food, bacitracin-polymyxin b, and neosporin [neomycin-bacitracin zn-polymyx].  MEDICATIONS:  Current Outpatient Medications  Medication Sig Dispense Refill   magic mouthwash (nystatin, lidocaine, diphenhydrAMINE, alum & mag hydroxide) suspension Swish and spit 5 mLs by mouth 4 (four) times daily for 7 days 140 mL 0   lidocaine-prilocaine (EMLA) cream Apply to affected area once 30 g 3   magnesium oxide (MAG-OX) 400 (240 Mg) MG tablet Take 1 tablet (400 mg total) by mouth daily. 30 tablet 1   ondansetron (ZOFRAN) 8 MG tablet Take 1 tablet (8 mg total) by mouth every 8 (eight) hours as needed for nausea or vomiting. Start on the third day after cisplatin. 30 tablet 1   prochlorperazine (COMPAZINE) 10 MG tablet Take 1 tablet (10 mg total) by mouth every 6 (six) hours as needed (Nausea or vomiting). 30 tablet 1   No current facility-administered medications for this visit.    SUMMARY OF ONCOLOGIC HISTORY: Oncology  History Overview Note  CPS score 8% for PD-L1   Cervical cancer, FIGO stage IB1 ([facility])  05/07/2022 Imaging   IMPRESSION: 1. No acute process identified. 2. Hepatic hemangioma and a subcentimeter hypodensity which may represent cyst or hemangioma. 3. Fibroid uterus.   06/10/2022 Procedure    Pap: HSIL, HPV 16+   07/24/2022 Initial Biopsy      08/23/2022 Imaging   PET:  IMPRESSION: 1. Marked ill-defined cervical FDG avidity consistent with primary cervical neoplasm. 2. No hypermetabolic abdominopelvic adenopathy or convincing evidence of hypermetabolic distant metastatic disease. 3. Very small volume pelvic free fluid does not demonstrate significant abnormal FDG avidity and there is no discrete hypermetabolic peritoneal nodularity, volume is within physiologic normal limits but is new from prior imaging. Suggest attention on follow-up imaging. 4. Scattered nodular areas of hypermetabolic uterine activity most notably involving a nodular focus in the uterine fundus with a max SUV of 4.33, commonly reflecting uterine fibroids. 5. Aortic Atherosclerosis (ICD10-I70.0) and Emphysema (ICD10-J43.9).   09/10/2022 Surgery   Radical abdominal hysterectomy with upper vaginectomy (2cm), bilateral salpingectomy, bilateral pelvic lymphadenectomy (Class C1 radical hysterectomy)    09/10/2022 Cancer Staging   Staging form: Cervix Uteri, AJCC Version 9 - Pathologic stage from 09/10/2022: FIGO Stage IB2 (pT1b2, pN0, cM0) - Signed by Bernadene Bell, MD on 09/23/2022 Histopathologic type: Squamous cell carcinoma, NOS Stage prefix: Initial diagnosis Histologic grade (G): G3 Histologic grading system: 3 grade system Residual tumor (R): R0 - None Lymph-vascular invasion (LVI): BOTH lymphatic and small vessel AND venous (large vessel) invasion    Pathology Results   FINAL MICROSCOPIC DIAGNOSIS: A. LYMPH NODES, RIGHT PELVIC, RESECTION: - Five lymph nodes with one showing evidence of endosalpingosis, negative for carcinoma (0/5) B. LYMPH NODES, LEFT PELVIC, RESECTION: - Four lymph nodes with some showing evidence of endosalpingosis, negative for carcinoma (0/4) C. ADHERENT OMENTUM, EXCISION: - Portion of omentum, negative for carcinoma D. 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 The catheter tip lies at the cavo-atrial junction. A power injectable port a cath was placed and is ready for immediate use   11/22/2022 -  Chemotherapy   Patient is on Treatment Plan : Cervical Cisplatin (40) q7d       PHYSICAL EXAMINATION: ECOG PERFORMANCE STATUS: 1 - Symptomatic but completely ambulatory  Vitals:   11/28/22 1223  BP: (!) 128/91  Pulse: 75  Resp: 18  SpO2: 100%   Filed Weights   11/28/22 1223  Weight: 118 lb 3.2 oz (53.6 kg)    GENERAL:alert, no distress and comfortable.  Noted mild canker score at the roof of her mouth SKIN: Incision appears well-healed.  There is protuberance near the incision scar but no signs of infection NEURO: alert & oriented x 3 with fluent speech, no focal motor/sensory deficits  LABORATORY DATA:  I have reviewed the data as listed  Component Value Date/Time   NA 136 11/28/2022 1246   K 4.3 11/28/2022 1246   CL 101 11/28/2022 1246   CO2 30 11/28/2022 1246   GLUCOSE 85 11/28/2022 1246   BUN 9 11/28/2022 1246   CREATININE 0.74 11/28/2022 1246   CALCIUM 9.9 11/28/2022 1246   PROT 8.3 (H) 10/17/2022 1302   ALBUMIN 4.7 10/17/2022 1302   AST 17 10/17/2022 1302   ALT 19 10/17/2022 1302   ALKPHOS 70 10/17/2022 1302   BILITOT 0.5 10/17/2022 1302   GFRNONAA >60 11/28/2022 1246    GFRAA >60 06/26/2011 1112    No results found for: "SPEP", "UPEP"  Lab Results  Component Value Date   WBC 6.8 11/28/2022   NEUTROABS 4.9 11/28/2022   HGB 13.5 11/28/2022   HCT 40.5 11/28/2022   MCV 101.0 (H) 11/28/2022   PLT 278 11/28/2022      Chemistry      Component Value Date/Time   NA 136 11/28/2022 1246   K 4.3 11/28/2022 1246   CL 101 11/28/2022 1246   CO2 30 11/28/2022 1246   BUN 9 11/28/2022 1246   CREATININE 0.74 11/28/2022 1246      Component Value Date/Time   CALCIUM 9.9 11/28/2022 1246   ALKPHOS 70 10/17/2022 1302   AST 17 10/17/2022 1302   ALT 19 10/17/2022 1302   BILITOT 0.5 10/17/2022 1302

## 2022-11-29 ENCOUNTER — Other Ambulatory Visit: Payer: Self-pay

## 2022-11-29 ENCOUNTER — Other Ambulatory Visit (HOSPITAL_COMMUNITY): Payer: Self-pay

## 2022-11-29 ENCOUNTER — Inpatient Hospital Stay: Payer: Commercial Managed Care - HMO

## 2022-11-29 ENCOUNTER — Ambulatory Visit
Admission: RE | Admit: 2022-11-29 | Discharge: 2022-11-29 | Disposition: A | Discharge status: Home / Self Care | Payer: Commercial Managed Care - HMO | Source: Ambulatory Visit | Attending: Radiation Oncology | Admitting: Radiation Oncology

## 2022-11-29 ENCOUNTER — Encounter: Payer: Self-pay | Admitting: Hematology and Oncology

## 2022-11-29 ENCOUNTER — Other Ambulatory Visit: Payer: Self-pay | Admitting: Hematology and Oncology

## 2022-11-29 ENCOUNTER — Other Ambulatory Visit: Payer: Self-pay | Admitting: Gynecologic Oncology

## 2022-11-29 VITALS — BP 128/78 | HR 72 | Temp 98.2°F | Resp 18

## 2022-11-29 DIAGNOSIS — Z510 Encounter for antineoplastic radiation therapy: Secondary | ICD-10-CM | POA: Diagnosis not present

## 2022-11-29 DIAGNOSIS — C539 Malignant neoplasm of cervix uteri, unspecified: Secondary | ICD-10-CM

## 2022-11-29 DIAGNOSIS — L7682 Other postprocedural complications of skin and subcutaneous tissue: Secondary | ICD-10-CM

## 2022-11-29 LAB — RAD ONC ARIA SESSION SUMMARY
Course Elapsed Days: 4
Plan Fractions Treated to Date: 5
Plan Prescribed Dose Per Fraction: 1.8 Gy
Plan Total Fractions Prescribed: 25
Plan Total Prescribed Dose: 45 Gy
Reference Point Dosage Given to Date: 9 Gy
Reference Point Session Dosage Given: 1.8 Gy
Session Number: 5

## 2022-11-29 MED ORDER — HEPARIN SOD (PORK) LOCK FLUSH 100 UNIT/ML IV SOLN: 500.0000 [IU] | Freq: Once | INTRAVENOUS | Status: DC | PRN

## 2022-11-29 MED ORDER — PALONOSETRON HCL INJECTION 0.25 MG/5ML
0.2500 mg | Freq: Once | INTRAVENOUS | Status: AC
  Administered 2022-11-29: 0.25 mg via INTRAVENOUS
  Filled 2022-11-29: qty 5

## 2022-11-29 MED ORDER — SODIUM CHLORIDE 0.9 % IV SOLN
150.0000 mg | Freq: Once | INTRAVENOUS | Status: AC
  Administered 2022-11-29: 150 mg via INTRAVENOUS
  Filled 2022-11-29: qty 150

## 2022-11-29 MED ORDER — SODIUM CHLORIDE 0.9 % IV SOLN: Freq: Once | INTRAVENOUS | Status: AC

## 2022-11-29 MED ORDER — POTASSIUM CHLORIDE IN NACL 20-0.9 MEQ/L-% IV SOLN
Freq: Once | INTRAVENOUS | Status: AC
  Filled 2022-11-29: qty 1000

## 2022-11-29 MED ORDER — MAGNESIUM SULFATE 2 GM/50ML IV SOLN
2.0000 g | Freq: Once | INTRAVENOUS | Status: AC
  Administered 2022-11-29: 2 g via INTRAVENOUS
  Filled 2022-11-29: qty 50

## 2022-11-29 MED ORDER — SODIUM CHLORIDE 0.9% FLUSH: 10.0000 mL | INTRAVENOUS | Status: DC | PRN

## 2022-11-29 MED ORDER — OXYCODONE HCL 5 MG PO TABS: 5.0000 mg | ORAL_TABLET | ORAL | 0 refills | Status: DC | PRN

## 2022-11-29 MED ORDER — SODIUM CHLORIDE 0.9 % IV SOLN
10.0000 mg | Freq: Once | INTRAVENOUS | Status: AC
  Administered 2022-11-29: 10 mg via INTRAVENOUS
  Filled 2022-11-29: qty 10

## 2022-11-29 MED ORDER — OXYCODONE HCL 5 MG PO TABS
10.0000 mg | ORAL_TABLET | Freq: Once | ORAL | Status: AC
  Administered 2022-11-29: 10 mg via ORAL
  Filled 2022-11-29: qty 2

## 2022-11-29 MED ORDER — SODIUM CHLORIDE 0.9 % IV SOLN
40.0000 mg/m2 | Freq: Once | INTRAVENOUS | Status: AC
  Administered 2022-11-29: 62 mg via INTRAVENOUS
  Filled 2022-11-29: qty 62

## 2022-11-29 NOTE — Progress Notes (Signed)
Pt returned my call regarding the [employer].  She would like to apply and since she's not receiving income at this time she will provide a letter of support on 12/02/22.  Once receive I will approve her for the grant, give her an expense sheet and my card for any questions or concerns she may have in the future.

## 2022-11-29 NOTE — Patient Instructions (Signed)
[facility]  Discharge Instructions: Thank you for choosing [facility] to provide your oncology and hematology care.   If you have a lab appointment with the [facility], please go directly to the [facility] and check in at the registration area.   Wear comfortable clothing and clothing appropriate for easy access to any Portacath or PICC line.   We strive to give you quality time with your provider. You may need to reschedule your appointment if you arrive late (15 or more minutes).  Arriving late affects you and other patients whose appointments are after yours.  Also, if you miss three or more appointments without notifying the office, you may be dismissed from the clinic at the provider's discretion.      For prescription refill requests, have your pharmacy contact our office and allow 72 hours for refills to be completed.    Today you received the following chemotherapy and/or immunotherapy agents Cisplatin       To help prevent nausea and vomiting after your treatment, we encourage you to take your nausea medication as directed.  BELOW ARE SYMPTOMS THAT SHOULD BE REPORTED IMMEDIATELY: *FEVER GREATER THAN 100.4 F (38 C) OR HIGHER *CHILLS OR SWEATING *NAUSEA AND VOMITING THAT IS NOT CONTROLLED WITH YOUR NAUSEA MEDICATION *UNUSUAL SHORTNESS OF BREATH *UNUSUAL BRUISING OR BLEEDING *URINARY PROBLEMS (pain or burning when urinating, or frequent urination) *BOWEL PROBLEMS (unusual diarrhea, constipation, pain near the anus) TENDERNESS IN MOUTH AND THROAT WITH OR WITHOUT PRESENCE OF ULCERS (sore throat, sores in mouth, or a toothache) UNUSUAL RASH, SWELLING OR PAIN  UNUSUAL VAGINAL DISCHARGE OR ITCHING   Items with * indicate a potential emergency and should be followed up as soon as possible or go to the Emergency Department if any problems should occur.  Please show the CHEMOTHERAPY ALERT CARD or IMMUNOTHERAPY ALERT CARD at check-in to  the Emergency Department and triage nurse.  Should you have questions after your visit or need to cancel or reschedule your appointment, please contact [facility]  Dept: [phone]  and follow the prompts.  Office hours are 8:00 a.m. to 4:30 p.m. Monday - Friday. Please note that voicemails left after 4:00 p.m. may not be returned until the following business day.  We are closed weekends and major holidays. You have access to a nurse at all times for urgent questions. Please call the main number to the clinic Dept: [phone] and follow the prompts.   For any non-urgent questions, you may also contact your provider using MyChart. We now offer e-Visits for anyone 34 and older to request care online for non-urgent symptoms. For details visit [email or web address].   Also download the MyChart app! Go to the app store, search "MyChart", open the app, select [facility], and log in with your MyChart username and password.  Cisplatin Injection What is this medication? CISPLATIN (SIS pla tin) treats some types of cancer. It works by slowing down the growth of cancer cells. This medicine may be used for other purposes; ask your health care provider or pharmacist if you have questions. COMMON BRAND NAME(S): Platinol, Platinol -AQ What should I tell my care team before I take this medication? They need to know if you have any of these conditions: Eye disease, vision problems Hearing problems Kidney disease Low blood counts, such as low white cells, platelets, or red blood cells Tingling of the fingers or toes, or other nerve disorder An unusual or allergic reaction  to cisplatin, carboplatin, oxaliplatin, other medications, foods, dyes, or preservatives If you or your partner are pregnant or trying to get pregnant Breast-feeding How should I use this medication? This medication is injected into a vein. It is given by your care team in a hospital or clinic  setting. Talk to your care team about the use of this medication in children. Special care may be needed. Overdosage: If you think you have taken too much of this medicine contact a poison control center or emergency room at once. NOTE: This medicine is only for you. Do not share this medicine with others. What if I miss a dose? Keep appointments for follow-up doses. It is important not to miss your dose. Call your care team if you are unable to keep an appointment. What may interact with this medication? Do not take this medication with any of the following: Live virus vaccines This medication may also interact with the following: Certain antibiotics, such as amikacin, gentamicin, neomycin, polymyxin B, streptomycin, tobramycin, vancomycin Foscarnet This list may not describe all possible interactions. Give your health care provider a list of all the medicines, herbs, non-prescription drugs, or dietary supplements you use. Also tell them if you smoke, drink alcohol, or use illegal drugs. Some items may interact with your medicine. What should I watch for while using this medication? Your condition will be monitored carefully while you are receiving this medication. You may need blood work done while taking this medication. This medication may make you feel generally unwell. This is not uncommon, as chemotherapy can affect healthy cells as well as cancer cells. Report any side effects. Continue your course of treatment even though you feel ill unless your care team tells you to stop. This medication may increase your risk of getting an infection. Call your care team for advice if you get a fever, chills, sore throat, or other symptoms of a cold or flu. Do not treat yourself. Try to avoid being around people who are sick. Avoid taking medications that contain aspirin, acetaminophen, ibuprofen, naproxen, or ketoprofen unless instructed by your care team. These medications may hide a fever. This  medication may increase your risk to bruise or bleed. Call your care team if you notice any unusual bleeding. Be careful brushing or flossing your teeth or using a toothpick because you may get an infection or bleed more easily. If you have any dental work done, tell your dentist you are receiving this medication. Drink fluids as directed while you are taking this medication. This will help protect your kidneys. Call your care team if you get diarrhea. Do not treat yourself. Talk to your care team if you or your partner wish to become pregnant or think you might be pregnant. This medication can cause serious birth defects if taken during pregnancy and for 14 months after the last dose. A negative pregnancy test is required before starting this medication. A reliable form of contraception is recommended while taking this medication and for 14 months after the last dose. Talk to your care team about effective forms of contraception. Do not father a child while taking this medication and for 11 months after the last dose. Use a condom during sex during this time period. Do not breast-feed while taking this medication. This medication may cause infertility. Talk to your care team if you are concerned about your fertility. What side effects may I notice from receiving this medication? Side effects that you should report to your care team as soon  as possible: Allergic reactions--skin rash, itching, hives, swelling of the face, lips, tongue, or throat Eye pain, change in vision, vision loss Hearing loss, ringing in ears Infection--fever, chills, cough, sore throat, wounds that don't heal, pain or trouble when passing urine, general feeling of discomfort or being unwell Kidney injury--decrease in the amount of urine, swelling of the ankles, hands, or feet Low red blood cell level--unusual weakness or fatigue, dizziness, headache, trouble breathing Painful swelling, warmth, or redness of the skin, blisters or  sores at the infusion site Pain, tingling, or numbness in the hands or feet Unusual bruising or bleeding Side effects that usually do not require medical attention (report to your care team if they continue or are bothersome): Hair loss Nausea Vomiting This list may not describe all possible side effects. Call your doctor for medical advice about side effects. You may report side effects to FDA at 1-800-FDA-1088. Where should I keep my medication? This medication is given in a hospital or clinic. It will not be stored at home. NOTE: This sheet is a summary. It may not cover all possible information. If you have questions about this medicine, talk to your doctor, pharmacist, or health care provider.  2023 Elsevier/Gold Standard (2022-03-01 00:00:00)

## 2022-11-29 NOTE — Progress Notes (Signed)
Patient having pain at the incision where a suture knot is close to the skin. Radiation staff asking for pain medication for this.

## 2022-12-02 ENCOUNTER — Inpatient Hospital Stay (HOSPITAL_BASED_OUTPATIENT_CLINIC_OR_DEPARTMENT_OTHER): Payer: Commercial Managed Care - HMO | Admitting: Psychiatry

## 2022-12-02 ENCOUNTER — Other Ambulatory Visit: Payer: Self-pay

## 2022-12-02 ENCOUNTER — Encounter: Payer: Self-pay | Admitting: Hematology and Oncology

## 2022-12-02 ENCOUNTER — Ambulatory Visit
Admission: RE | Admit: 2022-12-02 | Discharge: 2022-12-02 | Disposition: A | Discharge status: Home / Self Care | Payer: Commercial Managed Care - HMO | Source: Ambulatory Visit | Attending: Radiation Oncology | Admitting: Radiation Oncology

## 2022-12-02 VITALS — BP 130/73 | HR 82 | Temp 98.5°F | Resp 16 | Wt 116.3 lb

## 2022-12-02 DIAGNOSIS — C539 Malignant neoplasm of cervix uteri, unspecified: Secondary | ICD-10-CM

## 2022-12-02 DIAGNOSIS — Z510 Encounter for antineoplastic radiation therapy: Secondary | ICD-10-CM | POA: Diagnosis not present

## 2022-12-02 DIAGNOSIS — T8189XA Other complications of procedures, not elsewhere classified, initial encounter: Secondary | ICD-10-CM

## 2022-12-02 LAB — RAD ONC ARIA SESSION SUMMARY
Course Elapsed Days: 7
Plan Fractions Treated to Date: 6
Plan Prescribed Dose Per Fraction: 1.8 Gy
Plan Total Fractions Prescribed: 25
Plan Total Prescribed Dose: 45 Gy
Reference Point Dosage Given to Date: 10.8 Gy
Reference Point Session Dosage Given: 1.8 Gy
Session Number: 6

## 2022-12-02 NOTE — Progress Notes (Signed)
Pt is approved for the $1000 [employer] grant.  

## 2022-12-03 ENCOUNTER — Other Ambulatory Visit: Payer: Self-pay | Admitting: Hematology and Oncology

## 2022-12-03 ENCOUNTER — Ambulatory Visit
Admission: RE | Admit: 2022-12-03 | Discharge: 2022-12-03 | Disposition: A | Discharge status: Home / Self Care | Payer: Commercial Managed Care - HMO | Source: Ambulatory Visit | Attending: Radiation Oncology | Admitting: Radiation Oncology

## 2022-12-03 ENCOUNTER — Other Ambulatory Visit: Payer: Self-pay

## 2022-12-03 DIAGNOSIS — Z510 Encounter for antineoplastic radiation therapy: Secondary | ICD-10-CM | POA: Diagnosis not present

## 2022-12-03 DIAGNOSIS — C539 Malignant neoplasm of cervix uteri, unspecified: Secondary | ICD-10-CM

## 2022-12-03 LAB — RAD ONC ARIA SESSION SUMMARY
Course Elapsed Days: 8
Plan Fractions Treated to Date: 7
Plan Prescribed Dose Per Fraction: 1.8 Gy
Plan Total Fractions Prescribed: 25
Plan Total Prescribed Dose: 45 Gy
Reference Point Dosage Given to Date: 12.6 Gy
Reference Point Session Dosage Given: 1.8 Gy
Session Number: 7

## 2022-12-04 ENCOUNTER — Telehealth: Payer: Self-pay | Admitting: Oncology

## 2022-12-04 ENCOUNTER — Ambulatory Visit
Admission: RE | Admit: 2022-12-04 | Discharge: 2022-12-04 | Disposition: A | Discharge status: Home / Self Care | Payer: Commercial Managed Care - HMO | Source: Ambulatory Visit | Attending: Radiation Oncology | Admitting: Radiation Oncology

## 2022-12-04 ENCOUNTER — Other Ambulatory Visit: Payer: Self-pay

## 2022-12-04 ENCOUNTER — Inpatient Hospital Stay: Payer: Commercial Managed Care - HMO

## 2022-12-04 ENCOUNTER — Other Ambulatory Visit (HOSPITAL_COMMUNITY): Payer: Self-pay

## 2022-12-04 DIAGNOSIS — Z510 Encounter for antineoplastic radiation therapy: Secondary | ICD-10-CM | POA: Diagnosis not present

## 2022-12-04 DIAGNOSIS — C539 Malignant neoplasm of cervix uteri, unspecified: Secondary | ICD-10-CM

## 2022-12-04 LAB — BASIC METABOLIC PANEL - CANCER CENTER ONLY
Anion gap: 5 (ref 5–15)
BUN: 11 mg/dL (ref 6–20)
CO2: 27 mmol/L (ref 22–32)
Calcium: 9.5 mg/dL (ref 8.9–10.3)
Chloride: 103 mmol/L (ref 98–111)
Creatinine: 0.77 mg/dL (ref 0.44–1.00)
GFR, Estimated: >60 mL/min (ref >60)
Glucose, Bld: 83 mg/dL (ref 70–99)
Potassium: 4.5 mmol/L (ref 3.5–5.1)
Sodium: 135 mmol/L (ref 135–145)

## 2022-12-04 LAB — CBC WITH DIFFERENTIAL (CANCER CENTER ONLY)
Abs Immature Granulocytes: 0.02 10*3/uL (ref 0.00–0.07)
Basophils Absolute: 0 10*3/uL (ref 0.0–0.1)
Basophils Relative: 0 %
Eosinophils Absolute: 0.1 10*3/uL (ref 0.0–0.5)
Eosinophils Relative: 2 %
HCT: 39.5 % (ref 36.0–46.0)
Hemoglobin: 13.5 g/dL (ref 12.0–15.0)
Immature Granulocytes: 0 %
Lymphocytes Relative: 18 %
Lymphs Abs: 0.9 10*3/uL (ref 0.7–4.0)
MCH: 33.8 pg (ref 26.0–34.0)
MCHC: 34.2 g/dL (ref 30.0–36.0)
MCV: 98.8 fL (ref 80.0–100.0)
Monocytes Absolute: 0.3 10*3/uL (ref 0.1–1.0)
Monocytes Relative: 6 %
Neutro Abs: 3.7 10*3/uL (ref 1.7–7.7)
Neutrophils Relative %: 74 %
Platelet Count: 264 10*3/uL (ref 150–400)
RBC: 4 MIL/uL (ref 3.87–5.11)
RDW: 11.9 % (ref 11.5–15.5)
WBC Count: 5 10*3/uL (ref 4.0–10.5)
nRBC: 0 % (ref 0.0–0.2)

## 2022-12-04 LAB — RAD ONC ARIA SESSION SUMMARY
Course Elapsed Days: 9
Plan Fractions Treated to Date: 8
Plan Prescribed Dose Per Fraction: 1.8 Gy
Plan Total Fractions Prescribed: 25
Plan Total Prescribed Dose: 45 Gy
Reference Point Dosage Given to Date: 14.4 Gy
Reference Point Session Dosage Given: 1.8 Gy
Session Number: 8

## 2022-12-04 LAB — MAGNESIUM: Magnesium: 1.9 mg/dL (ref 1.7–2.4)

## 2022-12-04 MED ORDER — SODIUM CHLORIDE 0.9% FLUSH
10.0000 mL | Freq: Once | INTRAVENOUS | Status: AC
  Administered 2022-12-04: 10 mL

## 2022-12-04 MED ORDER — HEPARIN SOD (PORK) LOCK FLUSH 100 UNIT/ML IV SOLN: 500.0000 [IU] | Freq: Once | INTRAVENOUS | Status: DC

## 2022-12-04 NOTE — Telephone Encounter (Signed)
Called [facility] and reminded her about her lab appointment today following radiation.  She said she knew about it and will go to port flush/lab after radiation.

## 2022-12-05 ENCOUNTER — Other Ambulatory Visit: Payer: Self-pay

## 2022-12-05 ENCOUNTER — Encounter: Payer: Self-pay | Admitting: Hematology and Oncology

## 2022-12-05 ENCOUNTER — Inpatient Hospital Stay (HOSPITAL_BASED_OUTPATIENT_CLINIC_OR_DEPARTMENT_OTHER): Payer: Commercial Managed Care - HMO | Admitting: Hematology and Oncology

## 2022-12-05 ENCOUNTER — Ambulatory Visit
Admission: RE | Admit: 2022-12-05 | Discharge: 2022-12-05 | Disposition: A | Discharge status: Home / Self Care | Payer: Commercial Managed Care - HMO | Source: Ambulatory Visit | Attending: Radiation Oncology | Admitting: Radiation Oncology

## 2022-12-05 VITALS — BP 125/93 | HR 89 | Temp 98.6°F | Resp 18 | Ht 62.0 in | Wt 119.0 lb

## 2022-12-05 DIAGNOSIS — R197 Diarrhea, unspecified: Secondary | ICD-10-CM

## 2022-12-05 DIAGNOSIS — C539 Malignant neoplasm of cervix uteri, unspecified: Secondary | ICD-10-CM

## 2022-12-05 DIAGNOSIS — Z510 Encounter for antineoplastic radiation therapy: Secondary | ICD-10-CM | POA: Diagnosis not present

## 2022-12-05 LAB — RAD ONC ARIA SESSION SUMMARY
Course Elapsed Days: 10
Plan Fractions Treated to Date: 9
Plan Prescribed Dose Per Fraction: 1.8 Gy
Plan Total Fractions Prescribed: 25
Plan Total Prescribed Dose: 45 Gy
Reference Point Dosage Given to Date: 16.2 Gy
Reference Point Session Dosage Given: 1.8 Gy
Session Number: 9

## 2022-12-05 NOTE — Progress Notes (Signed)
[facility] OFFICE PROGRESS NOTE  Patient Care Team: Dorothyann Gibbs, NP as PCP - General (Gynecologic Oncology)  ASSESSMENT & PLAN:  Cervical cancer, FIGO stage IB1 ([place]) She is doing much better, except for mild diarrhea with chemotherapy and radiation She denies pain Her mucositis has resolved Her labs are normal We will proceed with treatment as scheduled  Diarrhea Likely due to side effects of treatment She will continue to take Imodium as needed I recommend witch hazel pads for irritation  No orders of the defined types were placed in this encounter.   All questions were answered. The patient knows to call the clinic with any problems, questions or concerns. The total time spent in the appointment was 20 minutes encounter with patients including review of chart and various tests results, discussions about plan of care and coordination of care plan   Heath Lark, MD 12/05/2022 1:04 PM  INTERVAL HISTORY: Please see below for problem oriented charting. she returns for treatment follow-up seen prior to cycle 3 of therapy She has some loose stool Mucositis is resolved She denies nausea  REVIEW OF SYSTEMS:   Constitutional: Denies fevers, chills or abnormal weight loss Eyes: Denies blurriness of vision Ears, nose, mouth, throat, and face: Denies mucositis or sore throat Respiratory: Denies cough, dyspnea or wheezes Cardiovascular: Denies palpitation, chest discomfort or lower extremity swelling Skin: Denies abnormal skin rashes Lymphatics: Denies new lymphadenopathy or easy bruising Neurological:Denies numbness, tingling or new weaknesses Behavioral/Psych: Mood is stable, no new changes  All other systems were reviewed with the patient and are negative.  I have reviewed the past medical history, past surgical history, social history and family history with the patient and they are unchanged from previous note.  ALLERGIES:  is allergic to black  pepper-turmeric, food, bacitracin-polymyxin b, and neosporin [neomycin-bacitracin zn-polymyx].  MEDICATIONS:  Current Outpatient Medications  Medication Sig Dispense Refill   lidocaine-prilocaine (EMLA) cream Apply to affected area once 30 g 3   magnesium oxide (MAG-OX) 400 (240 Mg) MG tablet Take 1 tablet (400 mg total) by mouth daily. 30 tablet 1   ondansetron (ZOFRAN) 8 MG tablet Take 1 tablet (8 mg total) by mouth every 8 (eight) hours as needed for nausea or vomiting. Start on the third day after cisplatin. 30 tablet 1   oxyCODONE (OXY IR/ROXICODONE) 5 MG immediate release tablet Take 1 tablet (5 mg total) by mouth every 4 (four) hours as needed for severe pain. Do not take and drive 10 tablet 0   prochlorperazine (COMPAZINE) 10 MG tablet Take 1 tablet (10 mg total) by mouth every 6 (six) hours as needed (Nausea or vomiting). 30 tablet 1   No current facility-administered medications for this visit.    SUMMARY OF ONCOLOGIC HISTORY: Oncology History Overview Note  CPS score 8% for PD-L1   Cervical cancer, FIGO stage IB1 ([place])  05/07/2022 Imaging   IMPRESSION: 1. No acute process identified. 2. Hepatic hemangioma and a subcentimeter hypodensity which may represent cyst or hemangioma. 3. Fibroid uterus.   06/10/2022 Procedure   Pap: HSIL, HPV 16+   07/24/2022 Initial Biopsy      08/23/2022 Imaging   PET:  IMPRESSION: 1. Marked ill-defined cervical FDG avidity consistent with primary cervical neoplasm. 2. No hypermetabolic abdominopelvic adenopathy or convincing evidence of hypermetabolic distant metastatic disease. 3. Very small volume pelvic free fluid does not demonstrate significant abnormal FDG avidity and there is no discrete hypermetabolic peritoneal nodularity, volume is within physiologic normal limits but is new  from prior imaging. Suggest attention on follow-up imaging. 4. Scattered nodular areas of hypermetabolic uterine activity most notably involving a nodular  focus in the uterine fundus with a max SUV of 4.33, commonly reflecting uterine fibroids. 5. Aortic Atherosclerosis (ICD10-I70.0) and Emphysema (ICD10-J43.9).   09/10/2022 Surgery   Radical abdominal hysterectomy with upper vaginectomy (2cm), bilateral salpingectomy, bilateral pelvic lymphadenectomy (Class C1 radical hysterectomy)    09/10/2022 Cancer Staging   Staging form: Cervix Uteri, AJCC Version 9 - Pathologic stage from 09/10/2022: FIGO Stage IB2 (pT1b2, pN0, cM0) - Signed by Bernadene Bell, MD on 09/23/2022 Histopathologic type: Squamous cell carcinoma, NOS Stage prefix: Initial diagnosis Histologic grade (G): G3 Histologic grading system: 3 grade system Residual tumor (R): R0 - None Lymph-vascular invasion (LVI): BOTH lymphatic and small vessel AND venous (large vessel) invasion    Pathology Results   FINAL MICROSCOPIC DIAGNOSIS: A. LYMPH NODES, RIGHT PELVIC, RESECTION: - Five lymph nodes with one showing evidence of endosalpingosis, negative for carcinoma (0/5) B. LYMPH NODES, LEFT PELVIC, RESECTION: - Four lymph nodes with some showing evidence of endosalpingosis, negative for carcinoma (0/4) C. ADHERENT OMENTUM, EXCISION: - Portion of omentum, negative for carcinoma D. 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 The  catheter tip lies at the cavo-atrial junction. A power injectable port a cath was placed and is ready for immediate use   11/22/2022 -  Chemotherapy   Patient is on Treatment Plan : Cervical Cisplatin (40) q7d       PHYSICAL EXAMINATION: ECOG PERFORMANCE STATUS: 0 - Asymptomatic  Vitals:   12/05/22 1210  BP: (!) 125/93  Pulse: 89  Resp: 18  Temp: 98.6 F (37 C)  SpO2: 100%   Filed Weights   12/05/22 1210  Weight: 119 lb (54 kg)    GENERAL:alert, no distress and comfortable OROPHARYNX:no exudate, no erythema and lips, buccal mucosa, and tongue normal.  Mucositis has resolved NEURO: alert & oriented x 3 with fluent speech, no focal motor/sensory deficits  LABORATORY DATA:  I have reviewed the data as listed    Component Value Date/Time   NA 135 12/04/2022 1145   K 4.5 12/04/2022 1145   CL 103 12/04/2022 1145   CO2 27 12/04/2022 1145   GLUCOSE 83 12/04/2022 1145   BUN 11 12/04/2022 1145   CREATININE 0.77 12/04/2022 1145   CALCIUM 9.5 12/04/2022 1145   PROT 8.3 (H) 10/17/2022 1302   ALBUMIN 4.7 10/17/2022 1302   AST 17 10/17/2022 1302   ALT 19 10/17/2022 1302   ALKPHOS 70 10/17/2022 1302   BILITOT 0.5 10/17/2022 1302   GFRNONAA >60 12/04/2022 1145   GFRAA >60 06/26/2011 1112    No results found for: "SPEP", "UPEP"  Lab Results  Component Value Date   WBC 5.0 12/04/2022   NEUTROABS 3.7 12/04/2022   HGB 13.5 12/04/2022   HCT 39.5 12/04/2022  MCV 98.8 12/04/2022   PLT 264 12/04/2022      Chemistry      Component Value Date/Time   NA 135 12/04/2022 1145   K 4.5 12/04/2022 1145   CL 103 12/04/2022 1145   CO2 27 12/04/2022 1145   BUN 11 12/04/2022 1145   CREATININE 0.77 12/04/2022 1145      Component Value Date/Time   CALCIUM 9.5 12/04/2022 1145   ALKPHOS 70 10/17/2022 1302   AST 17 10/17/2022 1302   ALT 19 10/17/2022 1302   BILITOT 0.5 10/17/2022 1302

## 2022-12-05 NOTE — Assessment & Plan Note (Signed)
Likely due to side effects of treatment She will continue to take Imodium as needed I recommend witch hazel pads for irritation

## 2022-12-05 NOTE — Assessment & Plan Note (Addendum)
She is doing much better, except for mild diarrhea with chemotherapy and radiation She denies pain Her mucositis has resolved Her labs are normal We will proceed with treatment as scheduled

## 2022-12-06 ENCOUNTER — Other Ambulatory Visit: Payer: Self-pay

## 2022-12-06 ENCOUNTER — Telehealth: Payer: Self-pay

## 2022-12-06 ENCOUNTER — Inpatient Hospital Stay: Payer: Commercial Managed Care - HMO

## 2022-12-06 ENCOUNTER — Ambulatory Visit
Admission: RE | Admit: 2022-12-06 | Discharge: 2022-12-06 | Disposition: A | Discharge status: Home / Self Care | Payer: Commercial Managed Care - HMO | Source: Ambulatory Visit | Attending: Radiation Oncology | Admitting: Radiation Oncology

## 2022-12-06 VITALS — BP 112/91 | HR 84 | Temp 98.4°F | Resp 16

## 2022-12-06 DIAGNOSIS — C539 Malignant neoplasm of cervix uteri, unspecified: Secondary | ICD-10-CM

## 2022-12-06 DIAGNOSIS — Z510 Encounter for antineoplastic radiation therapy: Secondary | ICD-10-CM | POA: Diagnosis not present

## 2022-12-06 LAB — RAD ONC ARIA SESSION SUMMARY
Course Elapsed Days: 11
Plan Fractions Treated to Date: 10
Plan Prescribed Dose Per Fraction: 1.8 Gy
Plan Total Fractions Prescribed: 25
Plan Total Prescribed Dose: 45 Gy
Reference Point Dosage Given to Date: 18 Gy
Reference Point Session Dosage Given: 1.8 Gy
Session Number: 10

## 2022-12-06 MED ORDER — PALONOSETRON HCL INJECTION 0.25 MG/5ML
0.2500 mg | Freq: Once | INTRAVENOUS | Status: AC
  Administered 2022-12-06: 0.25 mg via INTRAVENOUS
  Filled 2022-12-06: qty 5

## 2022-12-06 MED ORDER — SODIUM CHLORIDE 0.9 % IV SOLN
150.0000 mg | Freq: Once | INTRAVENOUS | Status: AC
  Administered 2022-12-06: 150 mg via INTRAVENOUS
  Filled 2022-12-06: qty 150

## 2022-12-06 MED ORDER — SODIUM CHLORIDE 0.9% FLUSH
10.0000 mL | INTRAVENOUS | Status: DC | PRN
  Administered 2022-12-06 (×2): 10 mL

## 2022-12-06 MED ORDER — SODIUM CHLORIDE 0.9 % IV SOLN
10.0000 mg | Freq: Once | INTRAVENOUS | Status: AC
  Administered 2022-12-06: 10 mg via INTRAVENOUS
  Filled 2022-12-06: qty 10

## 2022-12-06 MED ORDER — HEPARIN SOD (PORK) LOCK FLUSH 100 UNIT/ML IV SOLN
500.0000 [IU] | Freq: Once | INTRAVENOUS | Status: AC | PRN
  Administered 2022-12-06: 500 [IU]

## 2022-12-06 MED ORDER — POTASSIUM CHLORIDE IN NACL 20-0.9 MEQ/L-% IV SOLN
Freq: Once | INTRAVENOUS | Status: AC
  Filled 2022-12-06: qty 1000

## 2022-12-06 MED ORDER — WITCH HAZEL-GLYCERIN EX PADS
1.0000 | MEDICATED_PAD | CUTANEOUS | 12 refills | Status: DC | PRN
  Filled 2022-12-06: qty 40, fill #0

## 2022-12-06 MED ORDER — MAGNESIUM SULFATE 2 GM/50ML IV SOLN
2.0000 g | Freq: Once | INTRAVENOUS | Status: AC
  Administered 2022-12-06: 2 g via INTRAVENOUS
  Filled 2022-12-06: qty 50

## 2022-12-06 MED ORDER — SODIUM CHLORIDE 0.9 % IV SOLN
40.0000 mg/m2 | Freq: Once | INTRAVENOUS | Status: AC
  Administered 2022-12-06: 62 mg via INTRAVENOUS
  Filled 2022-12-06: qty 62

## 2022-12-06 MED ORDER — SODIUM CHLORIDE 0.9 % IV SOLN: Freq: Once | INTRAVENOUS | Status: AC

## 2022-12-06 MED ORDER — LOPERAMIDE HCL 2 MG PO CAPS
4.0000 mg | ORAL_CAPSULE | Freq: Once | ORAL | Status: AC
  Administered 2022-12-06: 4 mg via ORAL
  Filled 2022-12-06: qty 2

## 2022-12-06 NOTE — Telephone Encounter (Signed)
Called and left a message Rx for Tucks pads sent to [facility]. Ask her to call the office for questions.

## 2022-12-06 NOTE — Patient Instructions (Signed)
[facility]  Discharge Instructions: Thank you for choosing [facility] to provide your oncology and hematology care.   If you have a lab appointment with the Cancer Center, please go directly to the Cancer Center and check in at the registration area.   Wear comfortable clothing and clothing appropriate for easy access to any Portacath or PICC line.   We strive to give you quality time with your provider. You may need to reschedule your appointment if you arrive late (15 or more minutes).  Arriving late affects you and other patients whose appointments are after yours.  Also, if you miss three or more appointments without notifying the office, you may be dismissed from the clinic at the provider's discretion.      For prescription refill requests, have your pharmacy contact our office and allow 72 hours for refills to be completed.    Today you received the following chemotherapy and/or immunotherapy agents cisplatin      To help prevent nausea and vomiting after your treatment, we encourage you to take your nausea medication as directed.  BELOW ARE SYMPTOMS THAT SHOULD BE REPORTED IMMEDIATELY: *FEVER GREATER THAN 100.4 F (38 C) OR HIGHER *CHILLS OR SWEATING *NAUSEA AND VOMITING THAT IS NOT CONTROLLED WITH YOUR NAUSEA MEDICATION *UNUSUAL SHORTNESS OF BREATH *UNUSUAL BRUISING OR BLEEDING *URINARY PROBLEMS (pain or burning when urinating, or frequent urination) *BOWEL PROBLEMS (unusual diarrhea, constipation, pain near the anus) TENDERNESS IN MOUTH AND THROAT WITH OR WITHOUT PRESENCE OF ULCERS (sore throat, sores in mouth, or a toothache) UNUSUAL RASH, SWELLING OR PAIN  UNUSUAL VAGINAL DISCHARGE OR ITCHING   Items with * indicate a potential emergency and should be followed up as soon as possible or go to the Emergency Department if any problems should occur.  Please show the CHEMOTHERAPY ALERT CARD or IMMUNOTHERAPY ALERT CARD at  check-in to the Emergency Department and triage nurse.  Should you have questions after your visit or need to cancel or reschedule your appointment, please contact [facility]  Dept: [phone]  and follow the prompts.  Office hours are 8:00 a.m. to 4:30 p.m. Monday - Friday. Please note that voicemails left after 4:00 p.m. may not be returned until the following business day.  We are closed weekends and major holidays. You have access to a nurse at all times for urgent questions. Please call the main number to the clinic Dept: [phone] and follow the prompts.   For any non-urgent questions, you may also contact your provider using MyChart. We now offer e-Visits for anyone 18 and older to request care online for non-urgent symptoms. For details visit [email or web address].   Also download the MyChart app! Go to the app store, search "MyChart", open the app, select [facility], and log in with your MyChart username and password.   

## 2022-12-07 ENCOUNTER — Other Ambulatory Visit (HOSPITAL_COMMUNITY): Payer: Self-pay

## 2022-12-08 ENCOUNTER — Encounter: Payer: Self-pay | Admitting: Hematology and Oncology

## 2022-12-08 ENCOUNTER — Encounter: Payer: Self-pay | Admitting: Psychiatry

## 2022-12-08 NOTE — Progress Notes (Signed)
Gynecologic Oncology Return Clinic Visit  Date of Service: 12/02/2022 Referring Provider: Christophe Louis, MD   Assessment & Plan: Sarah Morris is a 42 y.o. 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 She presents for pain associated with knot of fascial suture.   Area overlying the knot was incised and the knot was trimmed (see procedure note below). Pt with immediate pain relief with procedure. Patient given instructions on maintaining her dressing for 48 hours and the sterri-strips for 1 week. Infection precautions reviewed.   RTC after completion of adjuvant treatment or sooner if needed.  Bernadene Bell, MD Gynecologic Oncology   Medical Decision Making I personally spent  TOTAL 20 minutes face-to-face and non-face-to-face in the care of this patient, which includes all pre, intra, and post visit time on the date of service.   ----------------------- Reason for Visit: Pain from suture  Treatment History: Oncology History Overview Note  CPS score 8% for PD-L1   Cervical cancer, FIGO stage IB1 ([place])  05/07/2022 Imaging   IMPRESSION: 1. No acute process identified. 2. Hepatic hemangioma and a subcentimeter hypodensity which may represent cyst or hemangioma. 3. Fibroid uterus.   06/10/2022 Procedure   Pap: HSIL, HPV 16+   07/24/2022 Initial Biopsy      08/23/2022 Imaging   PET:  IMPRESSION: 1. Marked ill-defined cervical FDG avidity consistent with primary cervical neoplasm. 2. No hypermetabolic abdominopelvic adenopathy or convincing evidence of hypermetabolic distant metastatic disease. 3. Very small volume pelvic free fluid does not demonstrate significant abnormal FDG avidity and there is no  discrete hypermetabolic peritoneal nodularity, volume is within physiologic normal limits but is new from prior imaging. Suggest attention on follow-up imaging. 4. Scattered nodular areas of hypermetabolic uterine activity most notably involving a nodular focus in the uterine fundus with a max SUV of 4.33, commonly reflecting uterine fibroids. 5. Aortic Atherosclerosis (ICD10-I70.0) and Emphysema (ICD10-J43.9).   09/10/2022 Surgery   Radical abdominal hysterectomy with upper vaginectomy (2cm), bilateral salpingectomy, bilateral pelvic lymphadenectomy (Class C1 radical hysterectomy)    09/10/2022 Cancer Staging   Staging form: Cervix Uteri, AJCC Version 9 - Pathologic stage from 09/10/2022: FIGO Stage IB2 (pT1b2, pN0, cM0) - Signed by Bernadene Bell, MD on 09/23/2022 Histopathologic type: Squamous cell carcinoma, NOS Stage prefix: Initial diagnosis Histologic grade (G): G3 Histologic grading system: 3 grade system Residual tumor (R): R0 - None Lymph-vascular invasion (LVI): BOTH lymphatic and small vessel AND venous (large vessel) invasion    Pathology Results   FINAL MICROSCOPIC DIAGNOSIS: A. LYMPH NODES, RIGHT PELVIC, RESECTION: - Five lymph nodes with one showing evidence of endosalpingosis, negative for carcinoma (0/5) B. LYMPH NODES, LEFT PELVIC, RESECTION: - Four lymph nodes with some showing evidence of endosalpingosis, negative for carcinoma (0/4) C. ADHERENT OMENTUM, EXCISION: - Portion of omentum, negative for carcinoma D. UTERUS, CERVIX, BILATERAL FALLOPIAN TUBES, UPPER 2CM OF VAGINA, EXCISION: - Invasive moderate to poorly differentiated squamous cell carcinoma, 2.4 cm, involving all quadrants of the cervix - Carcinoma invades into the stroma for a depth of 1.2 cm (of 1.7cm, >50%) - Lymphovascular invasion is identified - Resection margins are negative for carcinoma - Uterus with benign inactive endometrium and benign leiomyomata, up to 2.2 cm - Benign  unremarkable bilateral fallopian tubes - Portion of vagina, negative for carcinoma - See oncology table  09/10/2022 Pathology Results      10/18/2022 Imaging     11/20/2022 Procedure   Placement of single lumen port a cath via right internal jugular vein. The catheter tip lies at the cavo-atrial junction. A power injectable port a cath was placed and is ready for immediate use   11/22/2022 -  Chemotherapy   Patient is on Treatment Plan : Cervical Cisplatin (40) q7d       Interval History: Pt reports the she has been having severe pain need the middle of her incision when she feels that a knot is "trying to push through the skin." She reports that PO pain meds are not helping the pain.   Past Medical/Surgical History: Past Medical History:  Diagnosis Date   Alcoholism ([place])    Cervical cancer ([place])    Ectopic pregnancy    multiple    Past Surgical History:  Procedure Laterality Date   ABDOMINAL HYSTERECTOMY     BILATERAL SALPINGECTOMY N/A 09/10/2022   Procedure: OPEN BILATERAL SALPINGECTOMY;  Surgeon: Bernadene Bell, MD;  Location: [facility];  Service: Gynecology;  Laterality: N/A;   DILATION AND CURETTAGE OF UTERUS     IR IMAGING GUIDED PORT INSERTION  11/20/2022   LYMPH NODE DISSECTION N/A 09/10/2022   Procedure: PELVIC LYMPH NODE DISSECTION;  Surgeon: Bernadene Bell, MD;  Location: [facility];  Service: Gynecology;  Laterality: N/A;   RADICAL HYSTERECTOMY N/A 09/10/2022   Procedure: OPEN RADICAL HYSTERECTOMY;  Surgeon: Bernadene Bell, MD;  Location: [facility];  Service: Gynecology;  Laterality: N/A;    Family History  Problem Relation Age of Onset   Hypertension Mother    Diabetes Maternal Grandmother    Cancer Maternal Grandfather    Lung cancer Maternal Grandfather    Cancer Paternal Grandfather    Diabetes Cousin    Stomach cancer Neg Hx    Esophageal cancer Neg Hx    Colon cancer Neg Hx     Social History   Socioeconomic History   Marital status: Single     Spouse name: Not on file   Number of children: Not on file   Years of education: Not on file   Highest education level: Not on file  Occupational History   Occupation: handler  Tobacco Use   Smoking status: Every Day    Packs/day: 0.50    Types: Cigarettes    Start date: 2008   Smokeless tobacco: Never  Vaping Use   Vaping Use: Never used  Substance and Sexual Activity   Alcohol use: Yes    Alcohol/week: 25.0 standard drinks of alcohol    Types: 25 Cans of beer per week    Comment: occassionally   Drug use: Yes    Frequency: 7.0 times per week    Types: Marijuana   Sexual activity: Yes  Other Topics Concern   Not on file  Social History Narrative   Not on file   Social Determinants of Health   Financial Resource Strain: High Risk (08/19/2022)   Overall Financial Resource Strain (CARDIA)    Difficulty of Paying Living Expenses: Hard  Food Insecurity: Food Insecurity Present (09/12/2022)   Hunger Vital Sign    Worried About Running Out of Food in the Last Year: Often true    Ran Out of Food in the Last Year: Sometimes true  Transportation Needs: No Transportation Needs (08/19/2022)   PRAPARE - Hydrologist (Medical): No    Lack of Transportation (Non-Medical): No  Physical Activity:  Not on file  Stress: Not on file  Social Connections: Not on file    Current Medications:  Current Outpatient Medications:    lidocaine-prilocaine (EMLA) cream, Apply to affected area once, Disp: 30 g, Rfl: 3   magnesium oxide (MAG-OX) 400 (240 Mg) MG tablet, Take 1 tablet (400 mg total) by mouth daily., Disp: 30 tablet, Rfl: 1   ondansetron (ZOFRAN) 8 MG tablet, Take 1 tablet (8 mg total) by mouth every 8 (eight) hours as needed for nausea or vomiting. Start on the third day after cisplatin., Disp: 30 tablet, Rfl: 1   oxyCODONE (OXY IR/ROXICODONE) 5 MG immediate release tablet, Take 1 tablet (5 mg total) by mouth every 4 (four) hours as needed for severe pain.  Do not take and drive, Disp: 10 tablet, Rfl: 0   prochlorperazine (COMPAZINE) 10 MG tablet, Take 1 tablet (10 mg total) by mouth every 6 (six) hours as needed (Nausea or vomiting)., Disp: 30 tablet, Rfl: 1   witch hazel-glycerin (TUCKS) pad, Apply 1 Application topically as needed for itching., Disp: 40 each, Rfl: 12  Review of Symptoms: Complete 10-system review is negative except as above in Interval History.  Physical Exam: BP 130/73 (BP Location: Right Arm, Patient Position: Sitting)   Pulse 82   Temp 98.5 F (36.9 C)   Resp 16   Wt 116 lb 4.8 oz (52.8 kg)   LMP 08/27/2022 Comment: Spotted four days, 08-27-22 to 08-31-22;09-10-22 UA preg test negative  SpO2 100%   BMI 21.27 kg/m  General: Alert, oriented, appears mildly uncomfortable. HEENT: Normocephalic, atraumatic. Neck symmetric without masses. Sclera anicteric.  Chest: Normal work of breathing. Abdomen: Abdomen soft and nontender apart from discrete area to the left of the well healed midline incision near the level of the umbilicus. 1cm area to the left of the midline incision with palpation of knot from fascial stitch below the skin. No erythema or induration.  Extremities: Grossly normal range of motion.  Warm, well perfused.  No edema bilaterally. Skin: No rashes or lesions noted.  I&D Procedure: After appropriate verbal informed consent was obtained, a timeout was performed.The area over the knot was cleansed with betadine. Lidocaine was infiltrated in standard fashion. Using an 11blade, the skin was incised approximately 1cm. Using a hemostatic, scar tissue was bluntly broken up to free the end of the fascia suture knot. The knot was then grasped and trimmed close to the base of the knot. The incision was then cleansed again with betadine. The incision was reapproximated with sterri-strips.The patient tolerated the procedure well.   Laboratory & Radiologic Studies: none

## 2022-12-09 ENCOUNTER — Other Ambulatory Visit (HOSPITAL_COMMUNITY): Payer: Self-pay

## 2022-12-09 ENCOUNTER — Other Ambulatory Visit: Payer: Self-pay | Admitting: Radiation Oncology

## 2022-12-09 ENCOUNTER — Other Ambulatory Visit: Payer: Self-pay

## 2022-12-09 ENCOUNTER — Ambulatory Visit
Admission: RE | Admit: 2022-12-09 | Discharge: 2022-12-09 | Disposition: A | Discharge status: Home / Self Care | Payer: Commercial Managed Care - HMO | Source: Ambulatory Visit | Attending: Radiation Oncology | Admitting: Radiation Oncology

## 2022-12-09 ENCOUNTER — Encounter: Payer: Self-pay | Admitting: Hematology and Oncology

## 2022-12-09 DIAGNOSIS — Z510 Encounter for antineoplastic radiation therapy: Secondary | ICD-10-CM | POA: Diagnosis not present

## 2022-12-09 LAB — RAD ONC ARIA SESSION SUMMARY
Course Elapsed Days: 14
Plan Fractions Treated to Date: 11
Plan Prescribed Dose Per Fraction: 1.8 Gy
Plan Total Fractions Prescribed: 25
Plan Total Prescribed Dose: 45 Gy
Reference Point Dosage Given to Date: 19.8 Gy
Reference Point Session Dosage Given: 1.8 Gy
Session Number: 11

## 2022-12-09 MED ORDER — DIPHENOXYLATE-ATROPINE 2.5-0.025 MG PO TABS
2.0000 | ORAL_TABLET | Freq: Four times a day (QID) | ORAL | 0 refills | Status: DC | PRN
  Filled 2022-12-09: qty 30, 4d supply, fill #0

## 2022-12-10 ENCOUNTER — Ambulatory Visit: Payer: Commercial Managed Care - HMO

## 2022-12-10 ENCOUNTER — Ambulatory Visit
Admission: RE | Admit: 2022-12-10 | Discharge: 2022-12-10 | Disposition: A | Discharge status: Home / Self Care | Payer: Commercial Managed Care - HMO | Source: Ambulatory Visit | Attending: Radiation Oncology | Admitting: Radiation Oncology

## 2022-12-10 ENCOUNTER — Encounter: Payer: Self-pay | Admitting: Hematology and Oncology

## 2022-12-10 ENCOUNTER — Other Ambulatory Visit: Payer: Self-pay

## 2022-12-10 ENCOUNTER — Other Ambulatory Visit (HOSPITAL_COMMUNITY): Payer: Self-pay

## 2022-12-10 DIAGNOSIS — Z510 Encounter for antineoplastic radiation therapy: Secondary | ICD-10-CM | POA: Diagnosis not present

## 2022-12-10 LAB — RAD ONC ARIA SESSION SUMMARY
Course Elapsed Days: 15
Plan Fractions Treated to Date: 12
Plan Prescribed Dose Per Fraction: 1.8 Gy
Plan Total Fractions Prescribed: 25
Plan Total Prescribed Dose: 45 Gy
Reference Point Dosage Given to Date: 21.6 Gy
Reference Point Session Dosage Given: 1.8 Gy
Session Number: 12

## 2022-12-11 ENCOUNTER — Other Ambulatory Visit: Payer: Self-pay

## 2022-12-11 ENCOUNTER — Other Ambulatory Visit: Payer: Self-pay | Admitting: Hematology and Oncology

## 2022-12-11 ENCOUNTER — Other Ambulatory Visit (HOSPITAL_COMMUNITY): Payer: Self-pay

## 2022-12-11 ENCOUNTER — Ambulatory Visit
Admission: RE | Admit: 2022-12-11 | Discharge: 2022-12-11 | Disposition: A | Discharge status: Home / Self Care | Payer: Commercial Managed Care - HMO | Source: Ambulatory Visit | Attending: Radiation Oncology | Admitting: Radiation Oncology

## 2022-12-11 ENCOUNTER — Inpatient Hospital Stay: Payer: Commercial Managed Care - HMO

## 2022-12-11 DIAGNOSIS — Z510 Encounter for antineoplastic radiation therapy: Secondary | ICD-10-CM | POA: Diagnosis not present

## 2022-12-11 DIAGNOSIS — C539 Malignant neoplasm of cervix uteri, unspecified: Secondary | ICD-10-CM

## 2022-12-11 LAB — CBC WITH DIFFERENTIAL (CANCER CENTER ONLY)
Abs Immature Granulocytes: 0.02 10*3/uL (ref 0.00–0.07)
Basophils Absolute: 0 10*3/uL (ref 0.0–0.1)
Basophils Relative: 0 %
Eosinophils Absolute: 0.1 10*3/uL (ref 0.0–0.5)
Eosinophils Relative: 2 %
HCT: 35.5 % — ABNORMAL LOW (ref 36.0–46.0)
Hemoglobin: 12.6 g/dL (ref 12.0–15.0)
Immature Granulocytes: 0 %
Lymphocytes Relative: 11 %
Lymphs Abs: 0.6 10*3/uL — ABNORMAL LOW (ref 0.7–4.0)
MCH: 34.2 pg — ABNORMAL HIGH (ref 26.0–34.0)
MCHC: 35.5 g/dL (ref 30.0–36.0)
MCV: 96.5 fL (ref 80.0–100.0)
Monocytes Absolute: 0.4 10*3/uL (ref 0.1–1.0)
Monocytes Relative: 7 %
Neutro Abs: 4 10*3/uL (ref 1.7–7.7)
Neutrophils Relative %: 80 %
Platelet Count: 157 10*3/uL (ref 150–400)
RBC: 3.68 MIL/uL — ABNORMAL LOW (ref 3.87–5.11)
RDW: 11.5 % (ref 11.5–15.5)
WBC Count: 5.1 10*3/uL (ref 4.0–10.5)
nRBC: 0 % (ref 0.0–0.2)

## 2022-12-11 LAB — RAD ONC ARIA SESSION SUMMARY
Course Elapsed Days: 16
Plan Fractions Treated to Date: 13
Plan Prescribed Dose Per Fraction: 1.8 Gy
Plan Total Fractions Prescribed: 25
Plan Total Prescribed Dose: 45 Gy
Reference Point Dosage Given to Date: 23.4 Gy
Reference Point Session Dosage Given: 1.8 Gy
Session Number: 13

## 2022-12-11 LAB — BASIC METABOLIC PANEL - CANCER CENTER ONLY
Anion gap: 5 (ref 5–15)
BUN: 10 mg/dL (ref 6–20)
CO2: 27 mmol/L (ref 22–32)
Calcium: 9.5 mg/dL (ref 8.9–10.3)
Chloride: 104 mmol/L (ref 98–111)
Creatinine: 0.8 mg/dL (ref 0.44–1.00)
GFR, Estimated: >60 mL/min (ref >60)
Glucose, Bld: 86 mg/dL (ref 70–99)
Potassium: 4 mmol/L (ref 3.5–5.1)
Sodium: 136 mmol/L (ref 135–145)

## 2022-12-11 LAB — MAGNESIUM: Magnesium: 1.8 mg/dL (ref 1.7–2.4)

## 2022-12-11 MED ORDER — SODIUM CHLORIDE 0.9% FLUSH
10.0000 mL | Freq: Once | INTRAVENOUS | Status: AC
  Administered 2022-12-11: 10 mL

## 2022-12-11 MED ORDER — HEPARIN SOD (PORK) LOCK FLUSH 100 UNIT/ML IV SOLN
500.0000 [IU] | Freq: Once | INTRAVENOUS | Status: AC
  Administered 2022-12-11: 500 [IU]

## 2022-12-12 ENCOUNTER — Telehealth: Payer: Self-pay

## 2022-12-12 ENCOUNTER — Ambulatory Visit: Payer: Commercial Managed Care - HMO

## 2022-12-12 ENCOUNTER — Inpatient Hospital Stay: Payer: Commercial Managed Care - HMO | Admitting: Hematology and Oncology

## 2022-12-12 ENCOUNTER — Telehealth: Payer: Self-pay | Admitting: Oncology

## 2022-12-12 MED FILL — Dexamethasone Sodium Phosphate Inj 100 MG/10ML: INTRAMUSCULAR | Qty: 1 | Status: AC

## 2022-12-12 MED FILL — Fosaprepitant Dimeglumine For IV Infusion 150 MG (Base Eq): INTRAVENOUS | Qty: 5 | Status: AC

## 2022-12-12 NOTE — Telephone Encounter (Signed)
Left a message regarding canceled appointment with Dr. Alvy Bimler today.  Requested a return call.

## 2022-12-12 NOTE — Telephone Encounter (Signed)
Sarah Morris called back and said she was not feeling well today and is not able to come in for her appointments.  She is wondering if Dr. Alvy Bimler could call her.  Called her back and advised that Dr. Alvy Bimler will see her during her infusion tomorrow. She verbalized understanding and agreement.

## 2022-12-12 NOTE — Telephone Encounter (Signed)
Called and left a message asking her to call the office back regarding missing appt today with Dr. Alvy Bimler.

## 2022-12-12 NOTE — Telephone Encounter (Signed)
Left another message advising that if she is not seen by Dr. Alvy Bimler today, she will not be able to get chemotherapy tomorrow.  Requested a return call.

## 2022-12-13 ENCOUNTER — Inpatient Hospital Stay: Payer: Commercial Managed Care - HMO

## 2022-12-13 ENCOUNTER — Other Ambulatory Visit: Payer: Self-pay

## 2022-12-13 ENCOUNTER — Inpatient Hospital Stay (HOSPITAL_BASED_OUTPATIENT_CLINIC_OR_DEPARTMENT_OTHER): Payer: Commercial Managed Care - HMO | Admitting: Hematology and Oncology

## 2022-12-13 ENCOUNTER — Ambulatory Visit: Payer: Commercial Managed Care - HMO

## 2022-12-13 VITALS — BP 122/78 | HR 78 | Temp 98.2°F | Resp 18

## 2022-12-13 DIAGNOSIS — R197 Diarrhea, unspecified: Secondary | ICD-10-CM

## 2022-12-13 DIAGNOSIS — Z510 Encounter for antineoplastic radiation therapy: Secondary | ICD-10-CM | POA: Diagnosis not present

## 2022-12-13 DIAGNOSIS — C539 Malignant neoplasm of cervix uteri, unspecified: Secondary | ICD-10-CM

## 2022-12-13 MED ORDER — SODIUM CHLORIDE 0.9 % IV SOLN
40.0000 mg/m2 | Freq: Once | INTRAVENOUS | Status: AC
  Administered 2022-12-13: 62 mg via INTRAVENOUS
  Filled 2022-12-13: qty 62

## 2022-12-13 MED ORDER — SODIUM CHLORIDE 0.9 % IV SOLN
10.0000 mg | Freq: Once | INTRAVENOUS | Status: AC
  Administered 2022-12-13: 10 mg via INTRAVENOUS
  Filled 2022-12-13: qty 10

## 2022-12-13 MED ORDER — SODIUM CHLORIDE 0.9 % IV SOLN
150.0000 mg | Freq: Once | INTRAVENOUS | Status: AC
  Administered 2022-12-13: 150 mg via INTRAVENOUS
  Filled 2022-12-13: qty 150

## 2022-12-13 MED ORDER — MAGNESIUM SULFATE 2 GM/50ML IV SOLN
2.0000 g | Freq: Once | INTRAVENOUS | Status: AC
  Administered 2022-12-13: 2 g via INTRAVENOUS
  Filled 2022-12-13: qty 50

## 2022-12-13 MED ORDER — POTASSIUM CHLORIDE IN NACL 20-0.9 MEQ/L-% IV SOLN
Freq: Once | INTRAVENOUS | Status: AC
  Filled 2022-12-13: qty 1000

## 2022-12-13 MED ORDER — SODIUM CHLORIDE 0.9 % IV SOLN: Freq: Once | INTRAVENOUS | Status: AC

## 2022-12-13 MED ORDER — PALONOSETRON HCL INJECTION 0.25 MG/5ML
0.2500 mg | Freq: Once | INTRAVENOUS | Status: AC
  Administered 2022-12-13: 0.25 mg via INTRAVENOUS
  Filled 2022-12-13: qty 5

## 2022-12-14 ENCOUNTER — Encounter: Payer: Self-pay | Admitting: Hematology and Oncology

## 2022-12-14 NOTE — Assessment & Plan Note (Signed)
She is doing much better, except for mild diarrhea with chemotherapy and radiation She denies pain Her mucositis has resolved Her labs are normal We will proceed with treatment as scheduled

## 2022-12-14 NOTE — Progress Notes (Signed)
[facility] OFFICE PROGRESS NOTE  Patient Care Team: Dorothyann Gibbs, NP as PCP - General (Gynecologic Oncology)  ASSESSMENT & PLAN:  Cervical cancer, FIGO stage IB1 ([place]) She is doing much better, except for mild diarrhea with chemotherapy and radiation She denies pain Her mucositis has resolved Her labs are normal We will proceed with treatment as scheduled  Diarrhea Likely due to side effects of treatment She will continue to take Imodium as needed I recommend witch hazel pads for irritation We will start her oral magnesium supplement  No orders of the defined types were placed in this encounter.   All questions were answered. The patient knows to call the clinic with any problems, questions or concerns. The total time spent in the appointment was 20 minutes encounter with patients including review of chart and various tests results, discussions about plan of care and coordination of care plan   Heath Lark, MD 12/14/2022 9:56 AM  INTERVAL HISTORY: Please see below for problem oriented charting. she returns for treatment follow-up seen in the infusion room She continues to have frequent diarrhea Denies mucositis or nausea  REVIEW OF SYSTEMS:   Constitutional: Denies fevers, chills or abnormal weight loss Eyes: Denies blurriness of vision Ears, nose, mouth, throat, and face: Denies mucositis or sore throat Respiratory: Denies cough, dyspnea or wheezes Cardiovascular: Denies palpitation, chest discomfort or lower extremity swelling Skin: Denies abnormal skin rashes Lymphatics: Denies new lymphadenopathy or easy bruising Neurological:Denies numbness, tingling or new weaknesses Behavioral/Psych: Mood is stable, no new changes  All other systems were reviewed with the patient and are negative.  I have reviewed the past medical history, past surgical history, social history and family history with the patient and they are unchanged from previous  note.  ALLERGIES:  is allergic to black pepper-turmeric, food, bacitracin-polymyxin b, and neosporin [neomycin-bacitracin zn-polymyx].  MEDICATIONS:  Current Outpatient Medications  Medication Sig Dispense Refill   diphenoxylate-atropine (LOMOTIL) 2.5-0.025 MG tablet Take 2 tablets by mouth 4 (four) times daily as needed for diarrhea or loose stools. Not to exceed 8 tablets per day 30 tablet 0   lidocaine-prilocaine (EMLA) cream Apply to affected area once 30 g 3   ondansetron (ZOFRAN) 8 MG tablet Take 1 tablet (8 mg total) by mouth every 8 (eight) hours as needed for nausea or vomiting. Start on the third day after cisplatin. 30 tablet 1   oxyCODONE (OXY IR/ROXICODONE) 5 MG immediate release tablet Take 1 tablet (5 mg total) by mouth every 4 (four) hours as needed for severe pain. Do not take and drive 10 tablet 0   prochlorperazine (COMPAZINE) 10 MG tablet Take 1 tablet (10 mg total) by mouth every 6 (six) hours as needed (Nausea or vomiting). 30 tablet 1   witch hazel-glycerin (TUCKS) pad Apply 1 Application topically as needed for itching. 40 each 12   No current facility-administered medications for this visit.    SUMMARY OF ONCOLOGIC HISTORY: Oncology History Overview Note  CPS score 8% for PD-L1   Cervical cancer, FIGO stage IB1 ([place])  05/07/2022 Imaging   IMPRESSION: 1. No acute process identified. 2. Hepatic hemangioma and a subcentimeter hypodensity which may represent cyst or hemangioma. 3. Fibroid uterus.   06/10/2022 Procedure   Pap: HSIL, HPV 16+   07/24/2022 Initial Biopsy      08/23/2022 Imaging   PET:  IMPRESSION: 1. Marked ill-defined cervical FDG avidity consistent with primary cervical neoplasm. 2. No hypermetabolic abdominopelvic adenopathy or convincing evidence of hypermetabolic distant metastatic  disease. 3. Very small volume pelvic free fluid does not demonstrate significant abnormal FDG avidity and there is no discrete hypermetabolic peritoneal  nodularity, volume is within physiologic normal limits but is new from prior imaging. Suggest attention on follow-up imaging. 4. Scattered nodular areas of hypermetabolic uterine activity most notably involving a nodular focus in the uterine fundus with a max SUV of 4.33, commonly reflecting uterine fibroids. 5. Aortic Atherosclerosis (ICD10-I70.0) and Emphysema (ICD10-J43.9).   09/10/2022 Surgery   Radical abdominal hysterectomy with upper vaginectomy (2cm), bilateral salpingectomy, bilateral pelvic lymphadenectomy (Class C1 radical hysterectomy)    09/10/2022 Cancer Staging   Staging form: Cervix Uteri, AJCC Version 9 - Pathologic stage from 09/10/2022: FIGO Stage IB2 (pT1b2, pN0, cM0) - Signed by Bernadene Bell, MD on 09/23/2022 Histopathologic type: Squamous cell carcinoma, NOS Stage prefix: Initial diagnosis Histologic grade (G): G3 Histologic grading system: 3 grade system Residual tumor (R): R0 - None Lymph-vascular invasion (LVI): BOTH lymphatic and small vessel AND venous (large vessel) invasion    Pathology Results   FINAL MICROSCOPIC DIAGNOSIS: A. LYMPH NODES, RIGHT PELVIC, RESECTION: - Five lymph nodes with one showing evidence of endosalpingosis, negative for carcinoma (0/5) B. LYMPH NODES, LEFT PELVIC, RESECTION: - Four lymph nodes with some showing evidence of endosalpingosis, negative for carcinoma (0/4) C. ADHERENT OMENTUM, EXCISION: - Portion of omentum, negative for carcinoma D. UTERUS, CERVIX, BILATERAL FALLOPIAN TUBES, UPPER 2CM OF VAGINA, EXCISION: - Invasive moderate to poorly differentiated squamous cell carcinoma, 2.4 cm, involving all quadrants of the cervix - Carcinoma invades into the stroma for a depth of 1.2 cm (of 1.7cm, >50%) - Lymphovascular invasion is identified - Resection margins are negative for carcinoma - Uterus with benign inactive endometrium and benign leiomyomata, up to 2.2 cm - Benign unremarkable bilateral fallopian tubes -  Portion of vagina, negative for carcinoma - See oncology table    09/10/2022 Pathology Results      10/18/2022 Imaging     11/20/2022 Procedure   Placement of single lumen port a cath via right internal jugular vein. The catheter tip lies at the cavo-atrial junction. A power injectable port a cath was placed and is ready for immediate use   11/22/2022 -  Chemotherapy   Patient is on Treatment Plan : Cervical Cisplatin (40) q7d       PHYSICAL EXAMINATION: ECOG PERFORMANCE STATUS: 1 - Symptomatic but completely ambulatory GENERAL:alert, no distress and comfortable NEURO: alert & oriented x 3 with fluent speech, no focal motor/sensory deficits  LABORATORY DATA:  I have reviewed the data as listed    Component Value Date/Time   NA 136 12/11/2022 1152   K 4.0 12/11/2022 1152   CL 104 12/11/2022 1152   CO2 27 12/11/2022 1152   GLUCOSE 86 12/11/2022 1152   BUN 10 12/11/2022 1152   CREATININE 0.80 12/11/2022 1152   CALCIUM 9.5 12/11/2022 1152   PROT 8.3 (H) 10/17/2022 1302   ALBUMIN 4.7 10/17/2022 1302   AST 17 10/17/2022 1302   ALT 19 10/17/2022 1302   ALKPHOS 70 10/17/2022 1302   BILITOT 0.5 10/17/2022 1302   GFRNONAA >60 12/11/2022 1152   GFRAA >60 06/26/2011 1112    No results found for: "SPEP", "UPEP"  Lab Results  Component Value Date   WBC 5.1 12/11/2022   NEUTROABS 4.0 12/11/2022   HGB 12.6 12/11/2022   HCT 35.5 (L) 12/11/2022   MCV 96.5 12/11/2022   PLT 157 12/11/2022      Chemistry      Component Value  Date/Time   NA 136 12/11/2022 1152   K 4.0 12/11/2022 1152   CL 104 12/11/2022 1152   CO2 27 12/11/2022 1152   BUN 10 12/11/2022 1152   CREATININE 0.80 12/11/2022 1152      Component Value Date/Time   CALCIUM 9.5 12/11/2022 1152   ALKPHOS 70 10/17/2022 1302   AST 17 10/17/2022 1302   ALT 19 10/17/2022 1302   BILITOT 0.5 10/17/2022 1302

## 2022-12-14 NOTE — Assessment & Plan Note (Signed)
Likely due to side effects of treatment She will continue to take Imodium as needed I recommend witch hazel pads for irritation We will start her oral magnesium supplement

## 2022-12-15 ENCOUNTER — Other Ambulatory Visit: Payer: Self-pay

## 2022-12-16 ENCOUNTER — Other Ambulatory Visit: Payer: Self-pay

## 2022-12-16 ENCOUNTER — Ambulatory Visit
Admission: RE | Admit: 2022-12-16 | Discharge: 2022-12-16 | Disposition: A | Discharge status: Home / Self Care | Payer: Commercial Managed Care - HMO | Source: Ambulatory Visit | Attending: Radiation Oncology | Admitting: Radiation Oncology

## 2022-12-16 DIAGNOSIS — Z510 Encounter for antineoplastic radiation therapy: Secondary | ICD-10-CM | POA: Diagnosis not present

## 2022-12-16 LAB — RAD ONC ARIA SESSION SUMMARY
Course Elapsed Days: 21
Plan Fractions Treated to Date: 14
Plan Prescribed Dose Per Fraction: 1.8 Gy
Plan Total Fractions Prescribed: 25
Plan Total Prescribed Dose: 45 Gy
Reference Point Dosage Given to Date: 25.2 Gy
Reference Point Session Dosage Given: 1.8 Gy
Session Number: 14

## 2022-12-17 ENCOUNTER — Other Ambulatory Visit: Payer: Self-pay | Admitting: Radiation Oncology

## 2022-12-17 ENCOUNTER — Ambulatory Visit
Admission: RE | Admit: 2022-12-17 | Discharge: 2022-12-17 | Disposition: A | Discharge status: Home / Self Care | Payer: Commercial Managed Care - HMO | Source: Ambulatory Visit | Attending: Radiation Oncology | Admitting: Radiation Oncology

## 2022-12-17 ENCOUNTER — Other Ambulatory Visit (HOSPITAL_COMMUNITY): Payer: Self-pay

## 2022-12-17 ENCOUNTER — Other Ambulatory Visit: Payer: Self-pay

## 2022-12-17 DIAGNOSIS — Z510 Encounter for antineoplastic radiation therapy: Secondary | ICD-10-CM | POA: Diagnosis not present

## 2022-12-17 LAB — RAD ONC ARIA SESSION SUMMARY
Course Elapsed Days: 22
Plan Fractions Treated to Date: 15
Plan Prescribed Dose Per Fraction: 1.8 Gy
Plan Total Fractions Prescribed: 25
Plan Total Prescribed Dose: 45 Gy
Reference Point Dosage Given to Date: 27 Gy
Reference Point Session Dosage Given: 1.8 Gy
Session Number: 15

## 2022-12-17 MED ORDER — FLUCONAZOLE 100 MG PO TABS
100.0000 mg | ORAL_TABLET | Freq: Every day | ORAL | 0 refills | Status: DC
  Filled 2022-12-17: qty 7, 7d supply, fill #0

## 2022-12-17 MED ORDER — LORAZEPAM 0.5 MG PO TABS
0.5000 mg | ORAL_TABLET | Freq: Three times a day (TID) | ORAL | 0 refills | Status: DC
  Filled 2022-12-17: qty 30, 10d supply, fill #0

## 2022-12-18 ENCOUNTER — Inpatient Hospital Stay: Payer: Commercial Managed Care - HMO

## 2022-12-18 ENCOUNTER — Ambulatory Visit
Admission: RE | Admit: 2022-12-18 | Discharge: 2022-12-18 | Disposition: A | Discharge status: Home / Self Care | Payer: Commercial Managed Care - HMO | Source: Ambulatory Visit | Attending: Radiation Oncology | Admitting: Radiation Oncology

## 2022-12-18 ENCOUNTER — Other Ambulatory Visit: Payer: Self-pay

## 2022-12-18 DIAGNOSIS — Z510 Encounter for antineoplastic radiation therapy: Secondary | ICD-10-CM | POA: Diagnosis not present

## 2022-12-18 DIAGNOSIS — C539 Malignant neoplasm of cervix uteri, unspecified: Secondary | ICD-10-CM

## 2022-12-18 LAB — RAD ONC ARIA SESSION SUMMARY
Course Elapsed Days: 23
Plan Fractions Treated to Date: 16
Plan Prescribed Dose Per Fraction: 1.8 Gy
Plan Total Fractions Prescribed: 25
Plan Total Prescribed Dose: 45 Gy
Reference Point Dosage Given to Date: 28.8 Gy
Reference Point Session Dosage Given: 1.8 Gy
Session Number: 16

## 2022-12-18 LAB — BASIC METABOLIC PANEL - CANCER CENTER ONLY
Anion gap: 4 — ABNORMAL LOW (ref 5–15)
BUN: 9 mg/dL (ref 6–20)
CO2: 27 mmol/L (ref 22–32)
Calcium: 9.2 mg/dL (ref 8.9–10.3)
Chloride: 105 mmol/L (ref 98–111)
Creatinine: 0.78 mg/dL (ref 0.44–1.00)
GFR, Estimated: >60 mL/min (ref >60)
Glucose, Bld: 83 mg/dL (ref 70–99)
Potassium: 4 mmol/L (ref 3.5–5.1)
Sodium: 136 mmol/L (ref 135–145)

## 2022-12-18 LAB — CBC WITH DIFFERENTIAL (CANCER CENTER ONLY)
Abs Immature Granulocytes: 0.01 10*3/uL (ref 0.00–0.07)
Basophils Absolute: 0 10*3/uL (ref 0.0–0.1)
Basophils Relative: 0 %
Eosinophils Absolute: 0 10*3/uL (ref 0.0–0.5)
Eosinophils Relative: 0 %
HCT: 32.4 % — ABNORMAL LOW (ref 36.0–46.0)
Hemoglobin: 11.4 g/dL — ABNORMAL LOW (ref 12.0–15.0)
Immature Granulocytes: 0 %
Lymphocytes Relative: 19 %
Lymphs Abs: 0.6 10*3/uL — ABNORMAL LOW (ref 0.7–4.0)
MCH: 33.7 pg (ref 26.0–34.0)
MCHC: 35.2 g/dL (ref 30.0–36.0)
MCV: 95.9 fL (ref 80.0–100.0)
Monocytes Absolute: 0.2 10*3/uL (ref 0.1–1.0)
Monocytes Relative: 7 %
Neutro Abs: 2.2 10*3/uL (ref 1.7–7.7)
Neutrophils Relative %: 74 %
Platelet Count: 129 10*3/uL — ABNORMAL LOW (ref 150–400)
RBC: 3.38 MIL/uL — ABNORMAL LOW (ref 3.87–5.11)
RDW: 11.8 % (ref 11.5–15.5)
WBC Count: 3.1 10*3/uL — ABNORMAL LOW (ref 4.0–10.5)
nRBC: 0 % (ref 0.0–0.2)

## 2022-12-18 LAB — MAGNESIUM: Magnesium: 1.7 mg/dL (ref 1.7–2.4)

## 2022-12-18 MED ORDER — SODIUM CHLORIDE 0.9% FLUSH
10.0000 mL | Freq: Once | INTRAVENOUS | Status: AC
  Administered 2022-12-18: 10 mL

## 2022-12-18 MED ORDER — HEPARIN SOD (PORK) LOCK FLUSH 100 UNIT/ML IV SOLN
500.0000 [IU] | Freq: Once | INTRAVENOUS | Status: AC
  Administered 2022-12-18: 500 [IU]

## 2022-12-19 ENCOUNTER — Inpatient Hospital Stay (HOSPITAL_BASED_OUTPATIENT_CLINIC_OR_DEPARTMENT_OTHER): Payer: BLUE CROSS/BLUE SHIELD | Admitting: Hematology and Oncology

## 2022-12-19 ENCOUNTER — Encounter: Payer: Self-pay | Admitting: Hematology and Oncology

## 2022-12-19 ENCOUNTER — Other Ambulatory Visit: Payer: Self-pay

## 2022-12-19 ENCOUNTER — Ambulatory Visit
Admission: RE | Admit: 2022-12-19 | Discharge: 2022-12-19 | Disposition: A | Discharge status: Home / Self Care | Payer: BLUE CROSS/BLUE SHIELD | Source: Ambulatory Visit | Attending: Radiation Oncology | Admitting: Radiation Oncology

## 2022-12-19 ENCOUNTER — Other Ambulatory Visit (HOSPITAL_COMMUNITY): Payer: Self-pay

## 2022-12-19 VITALS — BP 117/79 | HR 78 | Resp 18 | Ht 62.0 in | Wt 114.0 lb

## 2022-12-19 DIAGNOSIS — C539 Malignant neoplasm of cervix uteri, unspecified: Secondary | ICD-10-CM | POA: Insufficient documentation

## 2022-12-19 DIAGNOSIS — Z5111 Encounter for antineoplastic chemotherapy: Secondary | ICD-10-CM | POA: Insufficient documentation

## 2022-12-19 DIAGNOSIS — R197 Diarrhea, unspecified: Secondary | ICD-10-CM | POA: Insufficient documentation

## 2022-12-19 DIAGNOSIS — Z510 Encounter for antineoplastic radiation therapy: Secondary | ICD-10-CM | POA: Diagnosis not present

## 2022-12-19 DIAGNOSIS — D61818 Other pancytopenia: Secondary | ICD-10-CM | POA: Insufficient documentation

## 2022-12-19 LAB — RAD ONC ARIA SESSION SUMMARY
Course Elapsed Days: 24
Plan Fractions Treated to Date: 17
Plan Prescribed Dose Per Fraction: 1.8 Gy
Plan Total Fractions Prescribed: 25
Plan Total Prescribed Dose: 45 Gy
Reference Point Dosage Given to Date: 30.6 Gy
Reference Point Session Dosage Given: 1.8 Gy
Session Number: 17

## 2022-12-19 MED FILL — Dexamethasone Sodium Phosphate Inj 100 MG/10ML: INTRAMUSCULAR | Qty: 1 | Status: AC

## 2022-12-19 MED FILL — Fosaprepitant Dimeglumine For IV Infusion 150 MG (Base Eq): INTRAVENOUS | Qty: 5 | Status: AC

## 2022-12-19 NOTE — Progress Notes (Signed)
[place] OFFICE PROGRESS NOTE  Patient Care Team: Dorothyann Gibbs, NP as PCP - General (Gynecologic Oncology)  ASSESSMENT & PLAN:  Cervical cancer, FIGO stage IB1 ([place]) Her treatment course is complicated by mild pancytopenia, mild weight loss and diarrhea I will adjust the dose of her chemotherapy based on today's weight She will complete treatment tomorrow as scheduled  Diarrhea Likely due to side effects of treatment She will continue to take Imodium as needed I recommend witch hazel pads for irritation  Pancytopenia, acquired ([place]) This is due to recent treatment She is not symptomatic We will proceed with treatment without delay  No orders of the defined types were placed in this encounter.   All questions were answered. The patient knows to call the clinic with any problems, questions or concerns. The total time spent in the appointment was 20 minutes encounter with patients including review of chart and various tests results, discussions about plan of care and coordination of care plan   Heath Lark, MD 12/19/2022 12:43 PM  INTERVAL HISTORY: Please see below for problem oriented charting. she returns for treatment follow-up seen prior to cycle 5 of treatment She has lost some weight due to diarrhea Denies peripheral neuropathy No recent bleeding  REVIEW OF SYSTEMS:   Constitutional: Denies fevers, chills  Eyes: Denies blurriness of vision Ears, nose, mouth, throat, and face: Denies mucositis or sore throat Respiratory: Denies cough, dyspnea or wheezes Cardiovascular: Denies palpitation, chest discomfort or lower extremity swelling Skin: Denies abnormal skin rashes Lymphatics: Denies new lymphadenopathy or easy bruising Neurological:Denies numbness, tingling or new weaknesses Behavioral/Psych: Mood is stable, no new changes  All other systems were reviewed with the patient and are negative.  I have reviewed the past medical history, past surgical  history, social history and family history with the patient and they are unchanged from previous note.  ALLERGIES:  is allergic to black pepper-turmeric, food, bacitracin-polymyxin b, and neosporin [neomycin-bacitracin zn-polymyx].  MEDICATIONS:  Current Outpatient Medications  Medication Sig Dispense Refill   fluconazole (DIFLUCAN) 100 MG tablet Take 1 tablet (100 mg total) by mouth daily. 7 tablet 0   LORazepam (ATIVAN) 0.5 MG tablet Take 1 tablet (0.5 mg total) by mouth every 8 (eight) hours. 30 tablet 0   diphenoxylate-atropine (LOMOTIL) 2.5-0.025 MG tablet Take 2 tablets by mouth 4 (four) times daily as needed for diarrhea or loose stools. Not to exceed 8 tablets per day 30 tablet 0   lidocaine-prilocaine (EMLA) cream Apply to affected area once 30 g 3   ondansetron (ZOFRAN) 8 MG tablet Take 1 tablet (8 mg total) by mouth every 8 (eight) hours as needed for nausea or vomiting. Start on the third day after cisplatin. 30 tablet 1   oxyCODONE (OXY IR/ROXICODONE) 5 MG immediate release tablet Take 1 tablet (5 mg total) by mouth every 4 (four) hours as needed for severe pain. Do not take and drive 10 tablet 0   prochlorperazine (COMPAZINE) 10 MG tablet Take 1 tablet (10 mg total) by mouth every 6 (six) hours as needed (Nausea or vomiting). 30 tablet 1   witch hazel-glycerin (TUCKS) pad Apply 1 Application topically as needed for itching. 40 each 12   No current facility-administered medications for this visit.    SUMMARY OF ONCOLOGIC HISTORY: Oncology History Overview Note  CPS score 8% for PD-L1   Cervical cancer, FIGO stage IB1 ([place])  05/07/2022 Imaging   IMPRESSION: 1. No acute process identified. 2. Hepatic hemangioma and a subcentimeter hypodensity  which may represent cyst or hemangioma. 3. Fibroid uterus.   06/10/2022 Procedure   Pap: HSIL, HPV 16+   07/24/2022 Initial Biopsy      08/23/2022 Imaging   PET:  IMPRESSION: 1. Marked ill-defined cervical FDG avidity consistent  with primary cervical neoplasm. 2. No hypermetabolic abdominopelvic adenopathy or convincing evidence of hypermetabolic distant metastatic disease. 3. Very small volume pelvic free fluid does not demonstrate significant abnormal FDG avidity and there is no discrete hypermetabolic peritoneal nodularity, volume is within physiologic normal limits but is new from prior imaging. Suggest attention on follow-up imaging. 4. Scattered nodular areas of hypermetabolic uterine activity most notably involving a nodular focus in the uterine fundus with a max SUV of 4.33, commonly reflecting uterine fibroids. 5. Aortic Atherosclerosis (ICD10-I70.0) and Emphysema (ICD10-J43.9).   09/10/2022 Surgery   Radical abdominal hysterectomy with upper vaginectomy (2cm), bilateral salpingectomy, bilateral pelvic lymphadenectomy (Class C1 radical hysterectomy)    09/10/2022 Cancer Staging   Staging form: Cervix Uteri, AJCC Version 9 - Pathologic stage from 09/10/2022: FIGO Stage IB2 (pT1b2, pN0, cM0) - Signed by Bernadene Bell, MD on 09/23/2022 Histopathologic type: Squamous cell carcinoma, NOS Stage prefix: Initial diagnosis Histologic grade (G): G3 Histologic grading system: 3 grade system Residual tumor (R): R0 - None Lymph-vascular invasion (LVI): BOTH lymphatic and small vessel AND venous (large vessel) invasion    Pathology Results   FINAL MICROSCOPIC DIAGNOSIS: A. LYMPH NODES, RIGHT PELVIC, RESECTION: - Five lymph nodes with one showing evidence of endosalpingosis, negative for carcinoma (0/5) B. LYMPH NODES, LEFT PELVIC, RESECTION: - Four lymph nodes with some showing evidence of endosalpingosis, negative for carcinoma (0/4) C. ADHERENT OMENTUM, EXCISION: - Portion of omentum, negative for carcinoma D. 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 The catheter tip lies at the cavo-atrial junction. A power injectable port a cath was placed and is ready for immediate use   11/22/2022 -  Chemotherapy   Patient is on Treatment Plan : Cervical Cisplatin (40) q7d       PHYSICAL EXAMINATION: ECOG PERFORMANCE STATUS: 1 - Symptomatic but completely ambulatory  Vitals:   12/19/22 1225  BP: 117/79  Pulse: 78  Resp: 18  SpO2: 100%   Filed Weights   12/19/22 1215 12/19/22 1225  Weight: 114 lb (51.7 kg) 114 lb (51.7 kg)    GENERAL:alert, no distress and comfortable  NEURO: alert & oriented x 3 with fluent speech, no focal motor/sensory deficits  LABORATORY DATA:  I have reviewed the data as listed    Component Value Date/Time   NA 136 12/18/2022 1235   K 4.0 12/18/2022 1235   CL 105 12/18/2022 1235   CO2 27 12/18/2022 1235   GLUCOSE 83 12/18/2022 1235   BUN 9 12/18/2022 1235   CREATININE 0.78 12/18/2022 1235   CALCIUM 9.2 12/18/2022 1235   PROT 8.3 (H) 10/17/2022 1302  ALBUMIN 4.7 10/17/2022 1302   AST 17 10/17/2022 1302   ALT 19 10/17/2022 1302   ALKPHOS 70 10/17/2022 1302   BILITOT 0.5 10/17/2022 1302   GFRNONAA >60 12/18/2022 1235   GFRAA >60 06/26/2011 1112    No results found for: "SPEP", "UPEP"  Lab Results  Component Value Date   WBC 3.1 (L) 12/18/2022   NEUTROABS 2.2 12/18/2022   HGB 11.4 (L) 12/18/2022   HCT 32.4 (L) 12/18/2022   MCV 95.9 12/18/2022   PLT 129 (L) 12/18/2022      Chemistry       Component Value Date/Time   NA 136 12/18/2022 1235   K 4.0 12/18/2022 1235   CL 105 12/18/2022 1235   CO2 27 12/18/2022 1235   BUN 9 12/18/2022 1235   CREATININE 0.78 12/18/2022 1235      Component Value Date/Time   CALCIUM 9.2 12/18/2022 1235   ALKPHOS 70 10/17/2022 1302   AST 17 10/17/2022 1302   ALT 19 10/17/2022 1302   BILITOT 0.5 10/17/2022 1302

## 2022-12-19 NOTE — Assessment & Plan Note (Signed)
Her treatment course is complicated by mild pancytopenia, mild weight loss and diarrhea I will adjust the dose of her chemotherapy based on today's weight She will complete treatment tomorrow as scheduled

## 2022-12-19 NOTE — Assessment & Plan Note (Signed)
Likely due to side effects of treatment She will continue to take Imodium as needed I recommend witch hazel pads for irritation

## 2022-12-19 NOTE — Assessment & Plan Note (Signed)
This is due to recent treatment She is not symptomatic We will proceed with treatment without delay

## 2022-12-20 ENCOUNTER — Inpatient Hospital Stay: Payer: BLUE CROSS/BLUE SHIELD

## 2022-12-20 ENCOUNTER — Other Ambulatory Visit: Payer: Self-pay

## 2022-12-20 ENCOUNTER — Ambulatory Visit
Admission: RE | Admit: 2022-12-20 | Discharge: 2022-12-20 | Disposition: A | Discharge status: Home / Self Care | Payer: BLUE CROSS/BLUE SHIELD | Source: Ambulatory Visit | Attending: Radiation Oncology | Admitting: Radiation Oncology

## 2022-12-20 VITALS — BP 129/82 | HR 92 | Temp 98.0°F | Resp 18

## 2022-12-20 DIAGNOSIS — C539 Malignant neoplasm of cervix uteri, unspecified: Secondary | ICD-10-CM | POA: Diagnosis not present

## 2022-12-20 LAB — RAD ONC ARIA SESSION SUMMARY
Course Elapsed Days: 25
Plan Fractions Treated to Date: 18
Plan Prescribed Dose Per Fraction: 1.8 Gy
Plan Total Fractions Prescribed: 25
Plan Total Prescribed Dose: 45 Gy
Reference Point Dosage Given to Date: 32.4 Gy
Reference Point Session Dosage Given: 1.8 Gy
Session Number: 18

## 2022-12-20 MED ORDER — PALONOSETRON HCL INJECTION 0.25 MG/5ML
0.2500 mg | Freq: Once | INTRAVENOUS | Status: AC
  Administered 2022-12-20: 0.25 mg via INTRAVENOUS
  Filled 2022-12-20: qty 5

## 2022-12-20 MED ORDER — SODIUM CHLORIDE 0.9 % IV SOLN: Freq: Once | INTRAVENOUS | Status: AC

## 2022-12-20 MED ORDER — SODIUM CHLORIDE 0.9 % IV SOLN
150.0000 mg | Freq: Once | INTRAVENOUS | Status: AC
  Administered 2022-12-20: 150 mg via INTRAVENOUS
  Filled 2022-12-20: qty 150

## 2022-12-20 MED ORDER — POTASSIUM CHLORIDE IN NACL 20-0.9 MEQ/L-% IV SOLN
Freq: Once | INTRAVENOUS | Status: AC
  Filled 2022-12-20: qty 1000

## 2022-12-20 MED ORDER — SODIUM CHLORIDE 0.9 % IV SOLN
10.0000 mg | Freq: Once | INTRAVENOUS | Status: AC
  Administered 2022-12-20: 10 mg via INTRAVENOUS
  Filled 2022-12-20: qty 10

## 2022-12-20 MED ORDER — MAGNESIUM SULFATE 2 GM/50ML IV SOLN
2.0000 g | Freq: Once | INTRAVENOUS | Status: AC
  Administered 2022-12-20: 2 g via INTRAVENOUS
  Filled 2022-12-20: qty 50

## 2022-12-20 MED ORDER — SODIUM CHLORIDE 0.9% FLUSH
10.0000 mL | INTRAVENOUS | Status: DC | PRN
  Administered 2022-12-20: 10 mL

## 2022-12-20 MED ORDER — HEPARIN SOD (PORK) LOCK FLUSH 100 UNIT/ML IV SOLN
500.0000 [IU] | Freq: Once | INTRAVENOUS | Status: AC | PRN
  Administered 2022-12-20: 500 [IU]

## 2022-12-20 MED ORDER — SODIUM CHLORIDE 0.9 % IV SOLN
40.0000 mg/m2 | Freq: Once | INTRAVENOUS | Status: AC
  Administered 2022-12-20: 60 mg via INTRAVENOUS
  Filled 2022-12-20: qty 60

## 2022-12-20 NOTE — Patient Instructions (Signed)
[facility]  Discharge Instructions: Thank you for choosing [facility] to provide your oncology and hematology care.   If you have a lab appointment with the Cancer Center, please go directly to the Cancer Center and check in at the registration area.   Wear comfortable clothing and clothing appropriate for easy access to any Portacath or PICC line.   We strive to give you quality time with your provider. You may need to reschedule your appointment if you arrive late (15 or more minutes).  Arriving late affects you and other patients whose appointments are after yours.  Also, if you miss three or more appointments without notifying the office, you may be dismissed from the clinic at the provider's discretion.      For prescription refill requests, have your pharmacy contact our office and allow 72 hours for refills to be completed.    Today you received the following chemotherapy and/or immunotherapy agents Cisplatin      To help prevent nausea and vomiting after your treatment, we encourage you to take your nausea medication as directed.  BELOW ARE SYMPTOMS THAT SHOULD BE REPORTED IMMEDIATELY: *FEVER GREATER THAN 100.4 F (38 C) OR HIGHER *CHILLS OR SWEATING *NAUSEA AND VOMITING THAT IS NOT CONTROLLED WITH YOUR NAUSEA MEDICATION *UNUSUAL SHORTNESS OF BREATH *UNUSUAL BRUISING OR BLEEDING *URINARY PROBLEMS (pain or burning when urinating, or frequent urination) *BOWEL PROBLEMS (unusual diarrhea, constipation, pain near the anus) TENDERNESS IN MOUTH AND THROAT WITH OR WITHOUT PRESENCE OF ULCERS (sore throat, sores in mouth, or a toothache) UNUSUAL RASH, SWELLING OR PAIN  UNUSUAL VAGINAL DISCHARGE OR ITCHING   Items with * indicate a potential emergency and should be followed up as soon as possible or go to the Emergency Department if any problems should occur.  Please show the CHEMOTHERAPY ALERT CARD or IMMUNOTHERAPY ALERT CARD at  check-in to the Emergency Department and triage nurse.  Should you have questions after your visit or need to cancel or reschedule your appointment, please contact [facility]  Dept: [phone]  and follow the prompts.  Office hours are 8:00 a.m. to 4:30 p.m. Monday - Friday. Please note that voicemails left after 4:00 p.m. may not be returned until the following business day.  We are closed weekends and major holidays. You have access to a nurse at all times for urgent questions. Please call the main number to the clinic Dept: [phone] and follow the prompts.   For any non-urgent questions, you may also contact your provider using MyChart. We now offer e-Visits for anyone 18 and older to request care online for non-urgent symptoms. For details visit [email or web address].   Also download the MyChart app! Go to the app store, search "MyChart", open the app, select [facility], and log in with your MyChart username and password.   

## 2022-12-23 ENCOUNTER — Ambulatory Visit: Payer: BLUE CROSS/BLUE SHIELD

## 2022-12-24 ENCOUNTER — Ambulatory Visit
Admission: RE | Admit: 2022-12-24 | Discharge: 2022-12-24 | Disposition: A | Discharge status: Home / Self Care | Payer: BLUE CROSS/BLUE SHIELD | Source: Ambulatory Visit | Attending: Radiation Oncology | Admitting: Radiation Oncology

## 2022-12-24 ENCOUNTER — Other Ambulatory Visit: Payer: Self-pay

## 2022-12-24 DIAGNOSIS — C539 Malignant neoplasm of cervix uteri, unspecified: Secondary | ICD-10-CM

## 2022-12-24 LAB — RAD ONC ARIA SESSION SUMMARY
Course Elapsed Days: 29
Plan Fractions Treated to Date: 19
Plan Prescribed Dose Per Fraction: 1.8 Gy
Plan Total Fractions Prescribed: 25
Plan Total Prescribed Dose: 45 Gy
Reference Point Dosage Given to Date: 34.2 Gy
Reference Point Session Dosage Given: 1.8 Gy
Session Number: 19

## 2022-12-25 ENCOUNTER — Ambulatory Visit: Payer: BLUE CROSS/BLUE SHIELD

## 2022-12-25 ENCOUNTER — Other Ambulatory Visit: Payer: Self-pay

## 2022-12-25 ENCOUNTER — Ambulatory Visit
Admission: RE | Admit: 2022-12-25 | Discharge: 2022-12-25 | Disposition: A | Discharge status: Home / Self Care | Payer: BLUE CROSS/BLUE SHIELD | Source: Ambulatory Visit | Attending: Radiation Oncology | Admitting: Radiation Oncology

## 2022-12-25 ENCOUNTER — Inpatient Hospital Stay: Payer: BLUE CROSS/BLUE SHIELD

## 2022-12-25 DIAGNOSIS — C539 Malignant neoplasm of cervix uteri, unspecified: Secondary | ICD-10-CM | POA: Diagnosis not present

## 2022-12-25 LAB — RAD ONC ARIA SESSION SUMMARY
Course Elapsed Days: 30
Plan Fractions Treated to Date: 20
Plan Prescribed Dose Per Fraction: 1.8 Gy
Plan Total Fractions Prescribed: 25
Plan Total Prescribed Dose: 45 Gy
Reference Point Dosage Given to Date: 36 Gy
Reference Point Session Dosage Given: 1.8 Gy
Session Number: 20

## 2022-12-25 LAB — CBC WITH DIFFERENTIAL (CANCER CENTER ONLY)
Abs Immature Granulocytes: 0 10*3/uL (ref 0.00–0.07)
Basophils Absolute: 0 10*3/uL (ref 0.0–0.1)
Basophils Relative: 1 %
Eosinophils Absolute: 0 10*3/uL (ref 0.0–0.5)
Eosinophils Relative: 0 %
HCT: 31.5 % — ABNORMAL LOW (ref 36.0–46.0)
Hemoglobin: 10.9 g/dL — ABNORMAL LOW (ref 12.0–15.0)
Immature Granulocytes: 0 %
Lymphocytes Relative: 16 %
Lymphs Abs: 0.3 10*3/uL — ABNORMAL LOW (ref 0.7–4.0)
MCH: 33.4 pg (ref 26.0–34.0)
MCHC: 34.6 g/dL (ref 30.0–36.0)
MCV: 96.6 fL (ref 80.0–100.0)
Monocytes Absolute: 0.2 10*3/uL (ref 0.1–1.0)
Monocytes Relative: 9 %
Neutro Abs: 1.5 10*3/uL — ABNORMAL LOW (ref 1.7–7.7)
Neutrophils Relative %: 74 %
Platelet Count: 102 10*3/uL — ABNORMAL LOW (ref 150–400)
RBC: 3.26 MIL/uL — ABNORMAL LOW (ref 3.87–5.11)
RDW: 11.8 % (ref 11.5–15.5)
WBC Count: 2.1 10*3/uL — ABNORMAL LOW (ref 4.0–10.5)
nRBC: 0 % (ref 0.0–0.2)

## 2022-12-25 LAB — BASIC METABOLIC PANEL - CANCER CENTER ONLY
Anion gap: 5 (ref 5–15)
BUN: 7 mg/dL (ref 6–20)
CO2: 27 mmol/L (ref 22–32)
Calcium: 9.3 mg/dL (ref 8.9–10.3)
Chloride: 104 mmol/L (ref 98–111)
Creatinine: 0.7 mg/dL (ref 0.44–1.00)
GFR, Estimated: >60 mL/min (ref >60)
Glucose, Bld: 80 mg/dL (ref 70–99)
Potassium: 4 mmol/L (ref 3.5–5.1)
Sodium: 136 mmol/L (ref 135–145)

## 2022-12-25 LAB — MAGNESIUM: Magnesium: 1.6 mg/dL — ABNORMAL LOW (ref 1.7–2.4)

## 2022-12-25 MED ORDER — HEPARIN SOD (PORK) LOCK FLUSH 100 UNIT/ML IV SOLN
500.0000 [IU] | Freq: Once | INTRAVENOUS | Status: AC
  Administered 2022-12-25: 500 [IU]

## 2022-12-25 MED ORDER — SODIUM CHLORIDE 0.9% FLUSH
10.0000 mL | Freq: Once | INTRAVENOUS | Status: AC
  Administered 2022-12-25: 10 mL

## 2022-12-26 ENCOUNTER — Other Ambulatory Visit (HOSPITAL_COMMUNITY): Payer: Self-pay

## 2022-12-26 ENCOUNTER — Other Ambulatory Visit: Payer: Self-pay

## 2022-12-26 ENCOUNTER — Inpatient Hospital Stay (HOSPITAL_BASED_OUTPATIENT_CLINIC_OR_DEPARTMENT_OTHER): Payer: BLUE CROSS/BLUE SHIELD | Admitting: Hematology and Oncology

## 2022-12-26 ENCOUNTER — Ambulatory Visit
Admission: RE | Admit: 2022-12-26 | Discharge: 2022-12-26 | Disposition: A | Discharge status: Home / Self Care | Payer: BLUE CROSS/BLUE SHIELD | Source: Ambulatory Visit | Attending: Radiation Oncology | Admitting: Radiation Oncology

## 2022-12-26 ENCOUNTER — Encounter: Payer: Self-pay | Admitting: Hematology and Oncology

## 2022-12-26 VITALS — BP 96/67 | HR 110 | Temp 98.3°F | Resp 18 | Ht 62.0 in | Wt 114.0 lb

## 2022-12-26 DIAGNOSIS — C539 Malignant neoplasm of cervix uteri, unspecified: Secondary | ICD-10-CM | POA: Diagnosis not present

## 2022-12-26 DIAGNOSIS — D61818 Other pancytopenia: Secondary | ICD-10-CM

## 2022-12-26 DIAGNOSIS — R197 Diarrhea, unspecified: Secondary | ICD-10-CM

## 2022-12-26 LAB — RAD ONC ARIA SESSION SUMMARY
Course Elapsed Days: 31
Plan Fractions Treated to Date: 21
Plan Prescribed Dose Per Fraction: 1.8 Gy
Plan Total Fractions Prescribed: 25
Plan Total Prescribed Dose: 45 Gy
Reference Point Dosage Given to Date: 37.8 Gy
Reference Point Session Dosage Given: 1.8 Gy
Session Number: 21

## 2022-12-26 MED ORDER — DIPHENOXYLATE-ATROPINE 2.5-0.025 MG PO TABS
2.0000 | ORAL_TABLET | Freq: Four times a day (QID) | ORAL | 0 refills | Status: DC | PRN
  Filled 2022-12-26: qty 60, 8d supply, fill #0

## 2022-12-26 NOTE — Assessment & Plan Note (Signed)
This is due to recent treatment I anticipate complete recovery with discontinuation of treatment

## 2022-12-26 NOTE — Progress Notes (Signed)
[place] OFFICE PROGRESS NOTE  Patient Care Team: Dorothyann Gibbs, NP as PCP - General (Gynecologic Oncology)  ASSESSMENT & PLAN:  Cervical cancer, FIGO stage IB1 [facility]) She has completed adjuvant chemotherapy The patient is quite adamant that the port needs to be removed as soon as possible We will get her port removed She will continue future follow-up with radiation oncologist and GYN surgeon  Pancytopenia, acquired [facility]) This is due to recent treatment I anticipate complete recovery with discontinuation of treatment  Diarrhea This is due to her recent treatment I refilled her prescription for Lomotil to take as needed  Orders Placed This Encounter  Procedures   IR REMOVAL TUN ACCESS W/ PORT W/O FL MOD SED    Standing Status:   Future    Standing Expiration Date:   12/27/2023    Order Specific Question:   Reason for exam:    Answer:   no need port    Order Specific Question:   Is the patient pregnant?    Answer:   No    Order Specific Question:   Preferred Imaging Location?    Answer:   [facility]    All questions were answered. The patient knows to call the clinic with any problems, questions or concerns. The total time spent in the appointment was 20 minutes encounter with patients including review of chart and various tests results, discussions about plan of care and coordination of care plan   Heath Lark, MD 12/26/2022 2:40 PM  INTERVAL HISTORY: Please see below for problem oriented charting. she returns for treatment follow-up  She just completed last cycle of treatment last week She continues to have diarrhea The patient wants her port removed as soon as possible  REVIEW OF SYSTEMS:   Constitutional: Denies fevers, chills or abnormal weight loss Eyes: Denies blurriness of vision Ears, nose, mouth, throat, and face: Denies mucositis or sore throat Respiratory: Denies cough, dyspnea or wheezes Cardiovascular: Denies palpitation, chest  discomfort or lower extremity swelling Skin: Denies abnormal skin rashes Lymphatics: Denies new lymphadenopathy or easy bruising Neurological:Denies numbness, tingling or new weaknesses Behavioral/Psych: Mood is stable, no new changes  All other systems were reviewed with the patient and are negative.  I have reviewed the past medical history, past surgical history, social history and family history with the patient and they are unchanged from previous note.  ALLERGIES:  is allergic to black pepper-turmeric, food, bacitracin-polymyxin b, and neosporin [neomycin-bacitracin zn-polymyx].  MEDICATIONS:  Current Outpatient Medications  Medication Sig Dispense Refill   fluconazole (DIFLUCAN) 100 MG tablet Take 1 tablet (100 mg total) by mouth daily. 7 tablet 0   LORazepam (ATIVAN) 0.5 MG tablet Take 1 tablet (0.5 mg total) by mouth every 8 (eight) hours. 30 tablet 0   diphenoxylate-atropine (LOMOTIL) 2.5-0.025 MG tablet Take 2 tablets by mouth 4 (four) times daily as needed for diarrhea or loose stools. Not to exceed 8 tablets per day 60 tablet 0   witch hazel-glycerin (TUCKS) pad Apply 1 Application topically as needed for itching. 40 each 12   No current facility-administered medications for this visit.    SUMMARY OF ONCOLOGIC HISTORY: Oncology History Overview Note  CPS score 8% for PD-L1   Cervical cancer, FIGO stage IB1 ([place])  05/07/2022 Imaging   IMPRESSION: 1. No acute process identified. 2. Hepatic hemangioma and a subcentimeter hypodensity which may represent cyst or hemangioma. 3. Fibroid uterus.   06/10/2022 Procedure   Pap: HSIL, HPV 16+  07/24/2022 Initial Biopsy      08/23/2022 Imaging   PET:  IMPRESSION: 1. Marked ill-defined cervical FDG avidity consistent with primary cervical neoplasm. 2. No hypermetabolic abdominopelvic adenopathy or convincing evidence of hypermetabolic distant metastatic disease. 3. Very small volume pelvic free fluid does not  demonstrate significant abnormal FDG avidity and there is no discrete hypermetabolic peritoneal nodularity, volume is within physiologic normal limits but is new from prior imaging. Suggest attention on follow-up imaging. 4. Scattered nodular areas of hypermetabolic uterine activity most notably involving a nodular focus in the uterine fundus with a max SUV of 4.33, commonly reflecting uterine fibroids. 5. Aortic Atherosclerosis (ICD10-I70.0) and Emphysema (ICD10-J43.9).   09/10/2022 Surgery   Radical abdominal hysterectomy with upper vaginectomy (2cm), bilateral salpingectomy, bilateral pelvic lymphadenectomy (Class C1 radical hysterectomy)    09/10/2022 Cancer Staging   Staging form: Cervix Uteri, AJCC Version 9 - Pathologic stage from 09/10/2022: FIGO Stage IB2 (pT1b2, pN0, cM0) - Signed by Bernadene Bell, MD on 09/23/2022 Histopathologic type: Squamous cell carcinoma, NOS Stage prefix: Initial diagnosis Histologic grade (G): G3 Histologic grading system: 3 grade system Residual tumor (R): R0 - None Lymph-vascular invasion (LVI): BOTH lymphatic and small vessel AND venous (large vessel) invasion    Pathology Results   FINAL MICROSCOPIC DIAGNOSIS: A. LYMPH NODES, RIGHT PELVIC, RESECTION: - Five lymph nodes with one showing evidence of endosalpingosis, negative for carcinoma (0/5) B. LYMPH NODES, LEFT PELVIC, RESECTION: - Four lymph nodes with some showing evidence of endosalpingosis, negative for carcinoma (0/4) C. ADHERENT OMENTUM, EXCISION: - Portion of omentum, negative for carcinoma D. UTERUS, CERVIX, BILATERAL FALLOPIAN TUBES, UPPER 2CM OF VAGINA, EXCISION: - Invasive moderate to poorly differentiated squamous cell carcinoma, 2.4 cm, involving all quadrants of the cervix - Carcinoma invades into the stroma for a depth of 1.2 cm (of 1.7cm, >50%) - Lymphovascular invasion is identified - Resection margins are negative for carcinoma - Uterus with benign inactive  endometrium and benign leiomyomata, up to 2.2 cm - Benign unremarkable bilateral fallopian tubes - Portion of vagina, negative for carcinoma - See oncology table    09/10/2022 Pathology Results      10/18/2022 Imaging     11/20/2022 Procedure   Placement of single lumen port a cath via right internal jugular vein. The catheter tip lies at the cavo-atrial junction. A power injectable port a cath was placed and is ready for immediate use   11/22/2022 - 12/20/2022 Chemotherapy   Patient is on Treatment Plan : Cervical Cisplatin (40) q7d       PHYSICAL EXAMINATION: ECOG PERFORMANCE STATUS: 1 - Symptomatic but completely ambulatory  Vitals:   12/26/22 1220  BP: 96/67  Pulse: (!) 110  Resp: 18  Temp: 98.3 F (36.8 C)  SpO2: 100%   Filed Weights   12/26/22 1220  Weight: 114 lb (51.7 kg)    GENERAL:alert, no distress and comfortable  NEURO: alert & oriented x 3 with fluent speech, no focal motor/sensory deficits  LABORATORY DATA:  I have reviewed the data as listed    Component Value Date/Time   NA 136 12/25/2022 1230   K 4.0 12/25/2022 1230   CL 104 12/25/2022 1230   CO2 27 12/25/2022 1230   GLUCOSE 80 12/25/2022 1230   BUN 7 12/25/2022 1230   CREATININE 0.70 12/25/2022 1230   CALCIUM 9.3 12/25/2022 1230   PROT 8.3 (H) 10/17/2022 1302   ALBUMIN 4.7 10/17/2022 1302   AST 17 10/17/2022 1302   ALT 19 10/17/2022 1302   ALKPHOS  70 10/17/2022 1302   BILITOT 0.5 10/17/2022 1302   GFRNONAA >60 12/25/2022 1230   GFRAA >60 06/26/2011 1112    No results found for: "SPEP", "UPEP"  Lab Results  Component Value Date   WBC 2.1 (L) 12/25/2022   NEUTROABS 1.5 (L) 12/25/2022   HGB 10.9 (L) 12/25/2022   HCT 31.5 (L) 12/25/2022   MCV 96.6 12/25/2022   PLT 102 (L) 12/25/2022      Chemistry      Component Value Date/Time   NA 136 12/25/2022 1230   K 4.0 12/25/2022 1230   CL 104 12/25/2022 1230   CO2 27 12/25/2022 1230   BUN 7 12/25/2022 1230   CREATININE 0.70 12/25/2022  1230      Component Value Date/Time   CALCIUM 9.3 12/25/2022 1230   ALKPHOS 70 10/17/2022 1302   AST 17 10/17/2022 1302   ALT 19 10/17/2022 1302   BILITOT 0.5 10/17/2022 1302

## 2022-12-26 NOTE — Assessment & Plan Note (Signed)
This is due to her recent treatment I refilled her prescription for Lomotil to take as needed

## 2022-12-26 NOTE — Assessment & Plan Note (Signed)
She has completed adjuvant chemotherapy The patient is quite adamant that the port needs to be removed as soon as possible We will get her port removed She will continue future follow-up with radiation oncologist and GYN surgeon

## 2022-12-27 ENCOUNTER — Ambulatory Visit: Payer: BLUE CROSS/BLUE SHIELD

## 2022-12-28 ENCOUNTER — Other Ambulatory Visit (HOSPITAL_COMMUNITY): Payer: Self-pay

## 2022-12-30 ENCOUNTER — Other Ambulatory Visit (HOSPITAL_COMMUNITY): Payer: Self-pay

## 2022-12-30 ENCOUNTER — Ambulatory Visit: Payer: BLUE CROSS/BLUE SHIELD

## 2022-12-31 ENCOUNTER — Ambulatory Visit
Admission: RE | Admit: 2022-12-31 | Discharge: 2022-12-31 | Disposition: A | Discharge status: Home / Self Care | Payer: BLUE CROSS/BLUE SHIELD | Source: Ambulatory Visit | Attending: Radiation Oncology | Admitting: Radiation Oncology

## 2022-12-31 ENCOUNTER — Other Ambulatory Visit: Payer: Self-pay | Admitting: Radiation Oncology

## 2022-12-31 ENCOUNTER — Other Ambulatory Visit: Payer: Self-pay

## 2022-12-31 ENCOUNTER — Ambulatory Visit: Payer: BLUE CROSS/BLUE SHIELD

## 2022-12-31 ENCOUNTER — Other Ambulatory Visit (HOSPITAL_COMMUNITY): Payer: Self-pay

## 2022-12-31 DIAGNOSIS — C539 Malignant neoplasm of cervix uteri, unspecified: Secondary | ICD-10-CM | POA: Diagnosis not present

## 2022-12-31 LAB — RAD ONC ARIA SESSION SUMMARY
Course Elapsed Days: 36
Plan Fractions Treated to Date: 22
Plan Prescribed Dose Per Fraction: 1.8 Gy
Plan Total Fractions Prescribed: 25
Plan Total Prescribed Dose: 45 Gy
Reference Point Dosage Given to Date: 39.6 Gy
Reference Point Session Dosage Given: 1.8 Gy
Session Number: 22

## 2022-12-31 MED ORDER — SILVER SULFADIAZINE 1 % EX CREA: TOPICAL_CREAM | Freq: Every day | CUTANEOUS | Status: DC

## 2022-12-31 MED ORDER — METRONIDAZOLE 0.75 % VA GEL
1.0000 | Freq: Every day | VAGINAL | 0 refills | Status: DC
  Filled 2022-12-31: qty 70, 5d supply, fill #0

## 2023-01-01 ENCOUNTER — Ambulatory Visit: Payer: BLUE CROSS/BLUE SHIELD

## 2023-01-01 ENCOUNTER — Ambulatory Visit
Admission: RE | Admit: 2023-01-01 | Discharge: 2023-01-01 | Disposition: A | Discharge status: Home / Self Care | Payer: BLUE CROSS/BLUE SHIELD | Source: Ambulatory Visit | Attending: Radiation Oncology | Admitting: Radiation Oncology

## 2023-01-01 ENCOUNTER — Other Ambulatory Visit: Payer: Self-pay

## 2023-01-01 DIAGNOSIS — C539 Malignant neoplasm of cervix uteri, unspecified: Secondary | ICD-10-CM | POA: Diagnosis not present

## 2023-01-01 LAB — RAD ONC ARIA SESSION SUMMARY
Course Elapsed Days: 37
Plan Fractions Treated to Date: 23
Plan Prescribed Dose Per Fraction: 1.8 Gy
Plan Total Fractions Prescribed: 25
Plan Total Prescribed Dose: 45 Gy
Reference Point Dosage Given to Date: 41.4 Gy
Reference Point Session Dosage Given: 1.8 Gy
Session Number: 23

## 2023-01-02 ENCOUNTER — Ambulatory Visit: Payer: BLUE CROSS/BLUE SHIELD

## 2023-01-02 ENCOUNTER — Ambulatory Visit
Admission: RE | Admit: 2023-01-02 | Discharge: 2023-01-02 | Disposition: A | Discharge status: Home / Self Care | Payer: BLUE CROSS/BLUE SHIELD | Source: Ambulatory Visit | Attending: Radiation Oncology | Admitting: Radiation Oncology

## 2023-01-02 ENCOUNTER — Other Ambulatory Visit: Payer: Self-pay

## 2023-01-02 DIAGNOSIS — C539 Malignant neoplasm of cervix uteri, unspecified: Secondary | ICD-10-CM | POA: Diagnosis not present

## 2023-01-02 LAB — RAD ONC ARIA SESSION SUMMARY
Course Elapsed Days: 38
Plan Fractions Treated to Date: 24
Plan Prescribed Dose Per Fraction: 1.8 Gy
Plan Total Fractions Prescribed: 25
Plan Total Prescribed Dose: 45 Gy
Reference Point Dosage Given to Date: 43.2 Gy
Reference Point Session Dosage Given: 1.8 Gy
Session Number: 24

## 2023-01-03 ENCOUNTER — Ambulatory Visit: Payer: BLUE CROSS/BLUE SHIELD

## 2023-01-03 ENCOUNTER — Inpatient Hospital Stay: Payer: BLUE CROSS/BLUE SHIELD | Admitting: Dietician

## 2023-01-06 ENCOUNTER — Ambulatory Visit: Payer: BLUE CROSS/BLUE SHIELD

## 2023-01-07 ENCOUNTER — Other Ambulatory Visit: Payer: Self-pay

## 2023-01-07 ENCOUNTER — Other Ambulatory Visit: Payer: Self-pay | Admitting: Radiology

## 2023-01-07 ENCOUNTER — Ambulatory Visit
Admission: RE | Admit: 2023-01-07 | Discharge: 2023-01-07 | Disposition: A | Discharge status: Home / Self Care | Payer: BLUE CROSS/BLUE SHIELD | Source: Ambulatory Visit | Attending: Radiation Oncology | Admitting: Radiation Oncology

## 2023-01-07 DIAGNOSIS — C539 Malignant neoplasm of cervix uteri, unspecified: Secondary | ICD-10-CM | POA: Diagnosis not present

## 2023-01-07 LAB — RAD ONC ARIA SESSION SUMMARY
Course Elapsed Days: 43
Plan Fractions Treated to Date: 25
Plan Prescribed Dose Per Fraction: 1.8 Gy
Plan Total Fractions Prescribed: 25
Plan Total Prescribed Dose: 45 Gy
Reference Point Dosage Given to Date: 45 Gy
Reference Point Session Dosage Given: 1.8 Gy
Session Number: 25

## 2023-01-08 ENCOUNTER — Ambulatory Visit (HOSPITAL_COMMUNITY)
Admission: RE | Admit: 2023-01-08 | Discharge: 2023-01-08 | Disposition: A | Discharge status: Home / Self Care | Payer: BLUE CROSS/BLUE SHIELD | Source: Ambulatory Visit | Attending: Hematology and Oncology | Admitting: Hematology and Oncology

## 2023-01-08 ENCOUNTER — Ambulatory Visit
Admission: RE | Admit: 2023-01-08 | Discharge: 2023-01-08 | Disposition: A | Discharge status: Home / Self Care | Payer: BLUE CROSS/BLUE SHIELD | Source: Ambulatory Visit | Attending: Radiation Oncology | Admitting: Radiation Oncology

## 2023-01-08 ENCOUNTER — Other Ambulatory Visit: Payer: Self-pay

## 2023-01-08 ENCOUNTER — Encounter (HOSPITAL_COMMUNITY): Payer: Self-pay

## 2023-01-08 DIAGNOSIS — C539 Malignant neoplasm of cervix uteri, unspecified: Secondary | ICD-10-CM | POA: Diagnosis not present

## 2023-01-08 DIAGNOSIS — Z8541 Personal history of malignant neoplasm of cervix uteri: Secondary | ICD-10-CM | POA: Insufficient documentation

## 2023-01-08 DIAGNOSIS — Z452 Encounter for adjustment and management of vascular access device: Secondary | ICD-10-CM | POA: Insufficient documentation

## 2023-01-08 HISTORY — PX: IR REMOVAL TUN ACCESS W/ PORT W/O FL MOD SED: IMG2290

## 2023-01-08 LAB — RAD ONC ARIA SESSION SUMMARY
Course Elapsed Days: 44
Plan Fractions Treated to Date: 1
Plan Prescribed Dose Per Fraction: 1.8 Gy
Plan Total Fractions Prescribed: 3
Plan Total Prescribed Dose: 5.4 Gy
Reference Point Dosage Given to Date: 1.8 Gy
Reference Point Session Dosage Given: 1.8 Gy
Session Number: 26

## 2023-01-08 MED ORDER — FENTANYL CITRATE (PF) 100 MCG/2ML IJ SOLN
INTRAMUSCULAR | Status: AC
  Filled 2023-01-08: qty 2

## 2023-01-08 MED ORDER — FENTANYL CITRATE (PF) 100 MCG/2ML IJ SOLN
INTRAMUSCULAR | Status: AC | PRN
  Administered 2023-01-08: 75 ug via INTRAVENOUS
  Administered 2023-01-08: 25 ug via INTRAVENOUS

## 2023-01-08 MED ORDER — SODIUM CHLORIDE 0.9 % IV SOLN: INTRAVENOUS | Status: DC

## 2023-01-08 MED ORDER — LIDOCAINE-EPINEPHRINE 1 %-1:100000 IJ SOLN
INTRAMUSCULAR | Status: AC
  Filled 2023-01-08: qty 1

## 2023-01-08 MED ORDER — MIDAZOLAM HCL 2 MG/2ML IJ SOLN
INTRAMUSCULAR | Status: AC
  Filled 2023-01-08: qty 2

## 2023-01-08 MED ORDER — MIDAZOLAM HCL 2 MG/2ML IJ SOLN
INTRAMUSCULAR | Status: AC | PRN
  Administered 2023-01-08: .5 mg via INTRAVENOUS
  Administered 2023-01-08: 1.5 mg via INTRAVENOUS

## 2023-01-08 NOTE — Discharge Instructions (Signed)
Discharge Instructions:   Please call Interventional Radiology clinic [phone] with any questions or concerns.  You may remove your dressing and shower tomorrow.  Implanted Port Removal, Care After The following information offers guidance on how to care for yourself after your procedure. Your health care provider may also give you more specific instructions. If you have problems or questions, contact your health care provider. What can I expect after the procedure? After the procedure, it is common to have: Soreness or pain near your incision. Some swelling or bruising near your incision. Follow these instructions at home: Medicines Take over-the-counter and prescription medicines only as told by your health care provider. If you were prescribed an antibiotic medicine, take it as told by your health care provider. Do not stop taking the antibiotic even if you start to feel better. Bathing Do not take baths, swim, or use a hot tub until your health care provider approves. Ask your health care provider if you can take showers. You may only be allowed to take sponge baths. Incision care Follow instructions from your health care provider about how to take care of your incision. Make sure you: Wash your hands with soap and water for at least 20 seconds before and after you change your bandage (dressing). If soap and water are not available, use hand sanitizer. Change your dressing as told by your health care provider. Keep your dressing dry. Leave stitches (sutures), skin glue, or adhesive strips in place. These skin closures may need to stay in place for 2 weeks or longer. If adhesive strip edges start to loosen and curl up, you may trim the loose edges. Do not remove adhesive strips completely unless your health care provider tells you to do that. Check your incision area every day for signs of infection. Check for: More redness, swelling, or pain. More fluid or blood. Warmth. Pus or a  bad smell. Activity Return to your normal activities as told by your health care provider. Ask your health care provider what activities are safe for you. You may have to avoid lifting. Ask your health care provider how much you can safely lift. Do not do activities that involve lifting your arms over your head. Driving If you were given a sedative during the procedure, it can affect you for several hours. Do not drive or operate machinery until your health care provider says that it is safe. If you did not receive a sedative, ask your health care provider when it is safe to drive. General instructions Do not use any products that contain nicotine or tobacco. These products include cigarettes, chewing tobacco, and vaping devices, such as e-cigarettes. These can delay healing after surgery. If you need help quitting, ask your health care provider. Keep all follow-up visits. This is important. Contact a health care provider if: You have a fever or chills. You have more redness, swelling, or pain around your incision. You have more fluid or blood coming from your incision. Your incision feels warm to the touch. You have pus or a bad smell coming from your incision. You have pain that is not relieved by your pain medicine. Get help right away if: You have chest pain. You have difficulty breathing. These symptoms may be an emergency. Get help right away. Call 911. Do not wait to see if the symptoms will go away. Do not drive yourself to the hospital. Summary After the procedure, it is common to have pain, soreness, swelling, or bruising near your incision. If you  were prescribed an antibiotic medicine, take it as told by your health care provider. Do not stop taking the antibiotic even if you start to feel better. If you were given a sedative during the procedure, it can affect you for several hours. Do not drive or operate machinery until your health care provider says that it is safe. Return  to your normal activities as told by your health care provider. Ask your health care provider what activities are safe for you. This information is not intended to replace advice given to you by your health care provider. Make sure you discuss any questions you have with your health care provider. Document Revised: 05/08/2021 Document Reviewed: 05/08/2021 Elsevier Patient Education  [place].  Moderate Conscious Sedation, Adult, Care After This sheet gives you information about how to care for yourself after your procedure. Your health care provider may also give you more specific instructions. If you have problems or questions, contact your health care provider. What can I expect after the procedure? After the procedure, it is common to have: Sleepiness for several hours. Impaired judgment for several hours. Difficulty with balance. Vomiting if you eat too soon. Follow these instructions at home: For the time period you were told by your health care provider: Rest. Do not participate in activities where you could fall or become injured. Do not drive or use machinery. Do not drink alcohol. Do not take sleeping pills or medicines that cause drowsiness. Do not make important decisions or sign legal documents. Do not take care of children on your own. Eating and drinking  Follow the diet recommended by your health care provider. Drink enough fluid to keep your urine pale yellow. If you vomit: Drink water, juice, or soup when you can drink without vomiting. Make sure you have little or no nausea before eating solid foods. General instructions Take over-the-counter and prescription medicines only as told by your health care provider. Have a responsible adult stay with you for the time you are told. It is important to have someone help care for you until you are awake and alert. Do not smoke. Keep all follow-up visits as told by your health care provider. This is  important. Contact a health care provider if: You are still sleepy or having trouble with balance after 24 hours. You feel light-headed. You keep feeling nauseous or you keep vomiting. You develop a rash. You have a fever. You have redness or swelling around the IV site. Get help right away if: You have trouble breathing. You have new-onset confusion at home. Summary After the procedure, it is common to feel sleepy, have impaired judgment, or feel nauseous if you eat too soon. Rest after you get home. Know the things you should not do after the procedure. Follow the diet recommended by your health care provider and drink enough fluid to keep your urine pale yellow. Get help right away if you have trouble breathing or new-onset confusion at home. This information is not intended to replace advice given to you by your health care provider. Make sure you discuss any questions you have with your health care provider. Document Revised: 03/03/2020 Document Reviewed: 09/30/2019 Elsevier Patient Education  [place].

## 2023-01-08 NOTE — H&P (Signed)
Referring Physician(s): Gorsuch,Ni  Supervising Physician: Arne Cleveland  Patient Status:  Sarah Morris OP  Chief Complaint: "I'm getting my port out"   Subjective: Patient known to IR service from Port-A-Cath placement on 11/20/2022.  She has a history of cervical cancer and has completed treatment.  She is no longer using her port and presents today for Port-A-Cath removal.  She currently denies fever, headache, chest pain, dyspnea, cough, abdominal pain, nausea, vomiting or bleeding. She does have some pelvic/genital region discomfort.    Past Medical History:  Diagnosis Date   Alcoholism ([place])    Cervical cancer ([place])    Ectopic pregnancy    multiple   Past Surgical History:  Procedure Laterality Date   ABDOMINAL HYSTERECTOMY     BILATERAL SALPINGECTOMY N/A 09/10/2022   Procedure: OPEN BILATERAL SALPINGECTOMY;  Surgeon: Bernadene Bell, MD;  Location: [facility];  Service: Gynecology;  Laterality: N/A;   DILATION AND CURETTAGE OF UTERUS     IR IMAGING GUIDED PORT INSERTION  11/20/2022   LYMPH NODE DISSECTION N/A 09/10/2022   Procedure: PELVIC LYMPH NODE DISSECTION;  Surgeon: Bernadene Bell, MD;  Location: [facility];  Service: Gynecology;  Laterality: N/A;   RADICAL HYSTERECTOMY N/A 09/10/2022   Procedure: OPEN RADICAL HYSTERECTOMY;  Surgeon: Bernadene Bell, MD;  Location: [facility];  Service: Gynecology;  Laterality: N/A;      Allergies: Black pepper-turmeric, Food, Bacitracin-polymyxin b, and Neosporin [neomycin-bacitracin zn-polymyx]  Medications: Prior to Admission medications   Medication Sig Start Date End Date Taking? Authorizing Provider  diphenoxylate-atropine (LOMOTIL) 2.5-0.025 MG tablet Take 2 tablets by mouth 4 (four) times daily as needed for diarrhea or loose stools. Not to exceed 8 tablets per day 12/26/22  Yes Gorsuch, Ni, MD  fluconazole (DIFLUCAN) 100 MG tablet Take 1 tablet (100 mg total) by mouth daily. 12/17/22   Gery Pray, MD  LORazepam (ATIVAN) 0.5 MG  tablet Take 1 tablet (0.5 mg total) by mouth every 8 (eight) hours. 12/17/22  Yes Gery Pray, MD  metroNIDAZOLE (METROGEL) 0.75 % vaginal gel Insert 1 Applicatorful vaginally daily for 5 days. 12/31/22  Yes Gery Pray, MD  witch hazel-glycerin (TUCKS) pad Apply 1 Application topically as needed for itching. 12/06/22  Yes Heath Lark, MD     Vital Signs: BP 102/74 (BP Location: Right Arm)   Pulse 72   Temp 98.7 F (37.1 C) (Oral)   Resp 18   Ht 5' 2"$  (1.575 m)   Wt 113 lb 15.7 oz (51.7 kg)   LMP 08/27/2022 Comment: Spotted four days, 08-27-22 to 08-31-22;09-10-22 UA preg test negative  SpO2 99%   BMI 20.85 kg/m    Code Status: FULL CODE  Physical Exam awake, alert.  Chest clear to auscultation bilaterally.  Clean, intact right chest wall Port-A-Cath.  Heart with regular rate and rhythm.  Abdomen soft, positive bowel sounds, some mild anterior pelvic tenderness to palpation.  No lower extremity edema.  Imaging: No results found.  Labs:  CBC: Recent Labs    12/04/22 1145 12/11/22 1152 12/18/22 1235 12/25/22 1230  WBC 5.0 5.1 3.1* 2.1*  HGB 13.5 12.6 11.4* 10.9*  HCT 39.5 35.5* 32.4* 31.5*  PLT 264 157 129* 102*    COAGS: Recent Labs    08/28/22 1445  INR 1.0  APTT 30    BMP: Recent Labs    12/04/22 1145 12/11/22 1152 12/18/22 1235 12/25/22 1230  NA 135 136 136 136  K 4.5 4.0 4.0 4.0  CL 103 104 105 104  CO2  $27 27 27 27  S$ GLUCOSE 83 86 83 80  BUN 11 10 9 7  $ CALCIUM 9.5 9.5 9.2 9.3  CREATININE 0.77 0.80 0.78 0.70  GFRNONAA >60 >60 >60 >60    LIVER FUNCTION TESTS: Recent Labs    04/29/22 1128 08/28/22 1445 10/17/22 1302  BILITOT 0.7 1.0 0.5  AST 16 21 17  $ ALT 11 15 19  $ ALKPHOS 69 62 70  PROT 7.7 8.3* 8.3*  ALBUMIN 4.5 4.6 4.7    Assessment and Plan: Patient known to IR service from Port-A-Cath placement on 11/20/2022.  She has a history of cervical cancer and has completed treatment.  She is no longer using her port and presents today for  Port-A-Cath removal.  Details/risks of procedure, including but not limited to, internal bleeding, infection, injury to adjacent structures discussed with patient with her understanding and consent.   Electronically Signed: D. Rowe Robert, PA-C 01/08/2023, 1:40 PM   I spent a total of 15 minutes at the the patient's bedside AND on the patient's hospital floor or unit, greater than 50% of which was counseling/coordinating care for port a cath removal

## 2023-01-08 NOTE — Procedures (Signed)
  Procedure:  R chest port removal   Preprocedure diagnosis: The encounter diagnosis was Cervical cancer, FIGO stage IB1 ([place]). Postprocedure diagnosis: same EBL:    minimal Complications:   none immediate  See full dictation in [facility].  Dillard Cannon MD Main # [phone] Pager  [phone] Mobile [phone]

## 2023-01-09 ENCOUNTER — Other Ambulatory Visit (HOSPITAL_COMMUNITY): Payer: Self-pay

## 2023-01-09 ENCOUNTER — Other Ambulatory Visit: Payer: Self-pay

## 2023-01-09 ENCOUNTER — Other Ambulatory Visit: Payer: Self-pay | Admitting: Radiation Oncology

## 2023-01-09 ENCOUNTER — Ambulatory Visit
Admission: RE | Admit: 2023-01-09 | Discharge: 2023-01-09 | Disposition: A | Discharge status: Home / Self Care | Payer: BLUE CROSS/BLUE SHIELD | Source: Ambulatory Visit | Attending: Radiation Oncology | Admitting: Radiation Oncology

## 2023-01-09 DIAGNOSIS — C539 Malignant neoplasm of cervix uteri, unspecified: Secondary | ICD-10-CM | POA: Diagnosis not present

## 2023-01-09 LAB — RAD ONC ARIA SESSION SUMMARY
Course Elapsed Days: 45
Plan Fractions Treated to Date: 2
Plan Prescribed Dose Per Fraction: 1.8 Gy
Plan Total Fractions Prescribed: 3
Plan Total Prescribed Dose: 5.4 Gy
Reference Point Dosage Given to Date: 3.6 Gy
Reference Point Session Dosage Given: 1.8 Gy
Session Number: 27

## 2023-01-09 MED ORDER — OXYCODONE-ACETAMINOPHEN 5-325 MG PO TABS
1.0000 | ORAL_TABLET | Freq: Four times a day (QID) | ORAL | 0 refills | Status: DC | PRN
  Filled 2023-01-09: qty 10, 3d supply, fill #0

## 2023-01-10 ENCOUNTER — Other Ambulatory Visit: Payer: Self-pay

## 2023-01-10 ENCOUNTER — Ambulatory Visit
Admission: RE | Admit: 2023-01-10 | Discharge: 2023-01-10 | Disposition: A | Discharge status: Home / Self Care | Payer: BLUE CROSS/BLUE SHIELD | Source: Ambulatory Visit | Attending: Radiation Oncology | Admitting: Radiation Oncology

## 2023-01-10 DIAGNOSIS — C539 Malignant neoplasm of cervix uteri, unspecified: Secondary | ICD-10-CM | POA: Diagnosis not present

## 2023-01-10 LAB — RAD ONC ARIA SESSION SUMMARY
Course Elapsed Days: 46
Plan Fractions Treated to Date: 3
Plan Prescribed Dose Per Fraction: 1.8 Gy
Plan Total Fractions Prescribed: 3
Plan Total Prescribed Dose: 5.4 Gy
Reference Point Dosage Given to Date: 5.4 Gy
Reference Point Session Dosage Given: 1.8 Gy
Session Number: 28

## 2023-01-10 NOTE — Radiation Completion Notes (Signed)
Patient Name: Sarah Morris, Sarah Morris MRN: [id number] Date of Birth: 1981-06-10 Referring Physician: Bernadene Bell, M.D. Date of Service: 2023-01-10 Radiation Oncologist: Teryl Lucy, M.D. [facility] END OF TREATMENT NOTE     Diagnosis: C53.9 Malignant neoplasm of cervix uteri, unspecified Staging on 2022-09-10: Cervical cancer, FIGO stage IB1 ([facility]) T=pT1b2, N=pN0, M=cM0 Intent: Curative     ==========DELIVERED PLANS==========  First Treatment Date: 2022-11-25 - Last Treatment Date: 2023-01-10   Plan Name: Pelvis Site: Pelvis Technique: IMRT Mode: Photon Dose Per Fraction: 1.8 Gy Prescribed Dose (Delivered / Prescribed): 45 Gy / 45 Gy Prescribed Fxs (Delivered / Prescribed): 25 / 25   Plan Name: Pelvis_Bst Site: Pelvis Technique: IMRT Mode: Photon Dose Per Fraction: 1.8 Gy Prescribed Dose (Delivered / Prescribed): 5.4 Gy / 5.4 Gy Prescribed Fxs (Delivered / Prescribed): 3 / 3     ==========ON TREATMENT VISIT DATES========== 2022-11-26, 2022-11-28, 2022-12-03, 2022-12-09, 2022-12-17, 2022-12-24, 2022-12-31, 2023-01-07, 2023-01-09     ==========UPCOMING VISITS==========       ==========APPENDIX - ON TREATMENT VISIT NOTES==========   See weekly On Treatment Notes is Epic for details.

## 2023-01-15 ENCOUNTER — Telehealth: Payer: Self-pay | Admitting: Oncology

## 2023-01-15 NOTE — Telephone Encounter (Signed)
Left a message with follow up appointment to see Dr. Ernestina Patches on 03/10/23 at 1:30.

## 2023-02-04 ENCOUNTER — Inpatient Hospital Stay: Payer: BLUE CROSS/BLUE SHIELD | Attending: Radiation Oncology | Admitting: Nutrition

## 2023-02-04 NOTE — Progress Notes (Signed)
42 year old female status post treatment for cervical cancer.  Past medical history includes alcoholism.  Noted allergies to black pepper and turmeric.  Medications include oxycodone.  Patient denies needing Lomotil at this time.  Labs reviewed.  Height: 5 feet 2 inches. Weight: 114.8 pounds. Usual body weight: 135-140 pounds per patient. BMI: 21.0.  She does not like being this thin and wants strategies for helping her to gain weight. Noted port removed on February 21.  Patient reports her appetite is slowly improving.   She likes most foods.   She has been drinking Ensure Plus occasionally. She sometimes eats a lot one day but then does not eat much the next day. States diarrhea has resolved and she is afraid she is now getting constipated.  Nutrition diagnosis:  Unintended weight loss related to cervical cancer and associated treatments as evidenced by 15% loss from usual body weight.  Intervention: Educated on strategies for increasing calories and protein.   Recommended small frequent meals with snacks 3 times daily.   Encourage patient to increase oral nutrition supplements between meals.   Provided samples and coupons.  Nutrition handouts provided. Also educated on ways to increase fiber gradually to minimize constipation.  Nutrition handout provided. Encouraged adequate fluid intake.    Monitoring, evaluation, goals: Patient will tolerate adequate calories and protein to promote weight gain. Patient is interested in nutrition follow-up.  Next visit: Tuesday, April 30. (Per patient request)  **Disclaimer: This note was dictated with voice recognition software. Similar sounding words can inadvertently be transcribed and this note may contain transcription errors which may not have been corrected upon publication of note.**

## 2023-02-07 ENCOUNTER — Encounter: Payer: Self-pay | Admitting: Radiation Oncology

## 2023-02-10 ENCOUNTER — Ambulatory Visit
Admission: RE | Admit: 2023-02-10 | Discharge: 2023-02-10 | Disposition: A | Discharge status: Home / Self Care | Payer: MEDICAID | Source: Ambulatory Visit | Attending: Radiation Oncology | Admitting: Radiation Oncology

## 2023-02-10 HISTORY — DX: Personal history of irradiation: Z92.3

## 2023-02-17 ENCOUNTER — Ambulatory Visit
Admission: RE | Admit: 2023-02-17 | Discharge: 2023-02-17 | Disposition: A | Discharge status: Home / Self Care | Payer: MEDICAID | Source: Ambulatory Visit | Attending: Radiation Oncology | Admitting: Radiation Oncology

## 2023-02-18 ENCOUNTER — Telehealth: Payer: Self-pay | Admitting: *Deleted

## 2023-02-18 NOTE — Telephone Encounter (Signed)
CALLED PATIENT TO INFORM THAT FU APPT. HAS BEEN MOVED TO 02-20-23 @ 10:30 AM, LVM FOR A RETURN CALL

## 2023-02-19 NOTE — Progress Notes (Signed)
Radiation Oncology         [phone] ________________________________  Name: Sarah Morris MRN: [id number]  Date: 02/20/2023  DOB: February 23, 1981  Follow-Up Visit Note  CC: Dorothyann Gibbs, NP  Bernadene Bell, MD  No diagnosis found.  Diagnosis: The encounter diagnosis was Cervical cancer, FIGO stage IB1 ([place]).   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 The patient tolerated radiation therapy relatively well. During her final treatment check on 02/22, the patient endorsed right chest pain and swelling due to having port removed, significant problems with diarrhea which required Imodium as well as Lomotil, and dysuria. Near the end of treatment, she reported improvement in her diarrhea and only requiring Lomotil 4 times daily. For her port site related pain, she was given a limited prescription of Percocet since her pain was interfering with her sleep.                   In the interval since her initial consultation date of 10/02/22,  the patient presented for a post op CT AP on 10/17/22 which showed a well-circumscribed fluid collection superiorly and to the right of the vaginal cuff in the mid pelvis, most consistent with a pelvic abscess. No other abnormalities were appreciated in the abdomen or pelvis.    Follow-up CT of the pelvis on 10/25/22 showed a decrease in size of the abscess located to the right of the vaginal cuff.         Most notably, the patient has now  completed concurrent adjuvant chemotherapy consisting of cisplatin x 5 cycles: delivered from 11/22/22 through 12/20/22 under the care of Dr. Alvy Bimler. She tolerated systemic treatment quite well overall. Chemo toxicities encountered by the patient included mild pancytopenia, diarrhea (managed with Lomotil), and mild weight loss secondary to diarrhea. Her final dose was adjusted in light of her issues with weight loss and diarrhea.   Of note: she had her port removed on 01/08/23 (patient requested for her port to be removed asap).   ***  Allergies:  is allergic to black pepper-turmeric, food, bacitracin-polymyxin b, and neosporin [neomycin-bacitracin zn-polymyx].  Meds: Current Outpatient Medications  Medication Sig Dispense Refill   fluconazole (DIFLUCAN) 100 MG tablet Take 1 tablet (100 mg total) by mouth daily. 7 tablet 0   diphenoxylate-atropine (LOMOTIL) 2.5-0.025 MG tablet Take 2 tablets by mouth 4 (four) times daily as needed for diarrhea or loose stools. Not to exceed 8 tablets per day 60 tablet 0   LORazepam (ATIVAN) 0.5 MG tablet Take 1 tablet (0.5 mg total) by mouth every 8 (eight) hours. 30 tablet 0   metroNIDAZOLE (METROGEL) 0.75 % vaginal gel Insert 1 Applicatorful vaginally daily for 5 days. 70 g 0   oxyCODONE-acetaminophen (PERCOCET/ROXICET) 5-325 MG tablet Take 1 tablet by mouth every 6 (six) hours as needed for severe pain. 10 tablet 0   witch hazel-glycerin (TUCKS) pad Apply 1 Application topically as needed for itching. 40 each 12   No current facility-administered medications for this encounter.  Physical Findings: The patient is in no acute distress. Patient is alert and oriented.  vitals were not taken for this visit. .  No significant changes. Lungs are clear to auscultation bilaterally. Heart has regular rate and rhythm. No palpable cervical, supraclavicular, or axillary adenopathy. Abdomen soft, non-tender, normal bowel sounds.   Lab Findings: Lab Results   Component Value Date   WBC 2.1 (L) 12/25/2022   HGB 10.9 (L) 12/25/2022   HCT 31.5 (L) 12/25/2022   MCV 96.6 12/25/2022   PLT 102 (L) 12/25/2022    Radiographic Findings: No results found.  Impression: The encounter diagnosis was Cervical cancer, FIGO stage IB1 ([place]).   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  ***  Plan:  ***   *** minutes of total time was spent for this patient encounter, including preparation, face-to-face counseling with the patient and coordination of care, physical exam, and documentation of the encounter. ____________________________________  Blair Promise, PhD, MD  This document serves as a record of services personally performed by Gery Pray, MD. It was created on his behalf by Roney Mans, a trained medical scribe. The creation of this record is based on the scribe's personal observations and the provider's statements to them. This document has been checked and approved by the attending provider.

## 2023-02-20 ENCOUNTER — Ambulatory Visit
Admission: RE | Admit: 2023-02-20 | Discharge: 2023-02-20 | Disposition: A | Discharge status: Home / Self Care | Payer: BLUE CROSS/BLUE SHIELD | Source: Ambulatory Visit | Attending: Radiation Oncology | Admitting: Radiation Oncology

## 2023-02-20 ENCOUNTER — Other Ambulatory Visit (HOSPITAL_COMMUNITY): Payer: Self-pay

## 2023-02-20 VITALS — BP 109/77 | HR 68 | Temp 97.2°F | Resp 18 | Ht 62.5 in | Wt 114.2 lb

## 2023-02-20 DIAGNOSIS — C539 Malignant neoplasm of cervix uteri, unspecified: Secondary | ICD-10-CM | POA: Diagnosis not present

## 2023-02-20 DIAGNOSIS — Z9221 Personal history of antineoplastic chemotherapy: Secondary | ICD-10-CM | POA: Diagnosis not present

## 2023-02-20 DIAGNOSIS — Z90710 Acquired absence of both cervix and uterus: Secondary | ICD-10-CM | POA: Diagnosis not present

## 2023-02-20 DIAGNOSIS — Z90722 Acquired absence of ovaries, bilateral: Secondary | ICD-10-CM | POA: Insufficient documentation

## 2023-02-20 DIAGNOSIS — Z79899 Other long term (current) drug therapy: Secondary | ICD-10-CM | POA: Diagnosis not present

## 2023-02-20 DIAGNOSIS — Z923 Personal history of irradiation: Secondary | ICD-10-CM | POA: Diagnosis not present

## 2023-02-20 MED ORDER — METRONIDAZOLE 0.75 % VA GEL
1.0000 | Freq: Every day | VAGINAL | 0 refills | Status: DC
  Filled 2023-02-20: qty 70, 7d supply, fill #0

## 2023-02-20 NOTE — Progress Notes (Signed)
Sarah Morris is here today for follow up post radiation to the pelvic.  They completed their radiation on: 01/10/23  Does the patient complain of any of the following:  Pain:Reports some discomfort to surgical incision. Abdominal bloating: No Diarrhea/Constipation: No Nausea/Vomiting: No Vaginal Discharge: Yes- whitish/ brown discharge. Patient states," I think I have BV."  Blood in Urine or Stool: No Urinary Issues (dysuria/incomplete emptying/ incontinence/ increased frequency/urgency): Urinary urgency Does patient report using vaginal dilator 2-3 times a week and/or sexually active 2-3 weeks: NA  Post radiation skin changes: Skin is improving.  Weight: Wt Readings from Last 3 Encounters:  02/04/23 114 lb 12.8 oz (52.1 kg)  01/08/23 113 lb 15.7 oz (51.7 kg)  12/26/22 114 lb (51.7 kg)     Additional comments if applicable: Patient continues to have increased fatigue.    BP 109/77 (BP Location: Left Arm, Patient Position: Sitting, Cuff Size: Normal)   Pulse 68   Temp (!) 97.2 F (36.2 C)   Resp 18   Ht 5' 2.5" (1.588 m)   Wt 114 lb 3.2 oz (51.8 kg)   LMP 08/27/2022 Comment: Spotted four days, 08-27-22 to 08-31-22;09-10-22 UA preg test negative  SpO2 100%   BMI 20.55 kg/m

## 2023-02-20 NOTE — Progress Notes (Signed)
  Radiation Oncology         [phone] ________________________________  Patient Name: Sarah Morris MRN: [id number] DOB: 03/30/1981 Referring Physician: NEWTON MEREDITH Date of Service: 01/10/2023 [facility], [place]                                                        End Of Treatment Note  Diagnoses: C53.9-Malignant neoplasm of cervix uteri, unspecified  Cancer Staging: The encounter diagnosis was Cervical cancer, FIGO stage IB1 ([facility]).   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 During her final treatment check on 02/22, the patient endorsed right chest pain and swelling due to having port removed, significant problems with diarrhea which required Imodium as well as Lomotil, and dysuria. Near the end of treatment, she reported improvement in her diarrhea and only requiring Lomotil 4 times daily. For her port site related pain, she was given a limited prescription of Percocet since her pain was interfering with her sleep.   Plan: The patient will follow-up with radiation oncology in one month.  ________________________________________________ -----------------------------------  Azalyn Sliwa D. Keshara Kiger, PhD, MD  This document serves as a record of services personally performed by Judieth Mckown, MD. It was created on his behalf by Elisa Frazier, a trained medical scribe. The creation of this record is based on the scribe's personal observations and the provider's statements to them. This document has been checked and approved by the attending provider.  

## 2023-02-27 ENCOUNTER — Other Ambulatory Visit (HOSPITAL_COMMUNITY): Payer: Self-pay

## 2023-03-10 ENCOUNTER — Inpatient Hospital Stay: Payer: Commercial Managed Care - HMO | Admitting: Psychiatry

## 2023-03-10 ENCOUNTER — Telehealth: Payer: Self-pay | Admitting: *Deleted

## 2023-03-10 DIAGNOSIS — C539 Malignant neoplasm of cervix uteri, unspecified: Secondary | ICD-10-CM

## 2023-03-10 NOTE — Telephone Encounter (Signed)
CALLED PATIENT TO INFORM FU WITH DR. KINARD HAS BEEN MOVED TO 09-01-23 @ 10 AM, LVM FOR A RETURN CALL

## 2023-03-12 NOTE — Progress Notes (Signed)
This encounter was created in error - please disregard. (Pt rescheduled).

## 2023-03-18 ENCOUNTER — Encounter: Payer: Self-pay | Admitting: Nutrition

## 2023-03-18 ENCOUNTER — Inpatient Hospital Stay: Payer: BLUE CROSS/BLUE SHIELD | Attending: Radiation Oncology | Admitting: Nutrition

## 2023-03-18 NOTE — Progress Notes (Signed)
Patient did not show up for nutrition follow up. Do not reschedule. Patient is no longer in treatment.

## 2023-03-24 ENCOUNTER — Encounter: Payer: Self-pay | Admitting: Psychiatry

## 2023-03-28 NOTE — Telephone Encounter (Signed)
Error

## 2023-04-23 ENCOUNTER — Telehealth: Payer: Self-pay | Admitting: Surgery

## 2023-04-23 NOTE — Telephone Encounter (Addendum)
Patient called in stating she is dealing with the same constipation issues she had after surgery. States it feels like food isn't passing. She is having daily or every other day bowel movements but they are small and hard. She feels full quickly and feels abdominal discomfort above incision/below breast area. She denies any change in health status or medications. Is not taking any stool softeners or laxatives. Advised patient to start stool softeners, recommended Miralax or Metamucil and to increase her fluid intake. Advised her that if she is not seeing any improvement in 2-3 days to call our office back. Also advised patient that Dr Alvester Morin would be notified and our office will call her if there are any additional recommendations.

## 2023-04-24 ENCOUNTER — Telehealth: Payer: Self-pay | Admitting: *Deleted

## 2023-04-24 NOTE — Telephone Encounter (Signed)
Attempted to reach Sarah Morris to relay message from Dr. Alvester Morin to make sure she is not still using imodium. Left voicemail for patient to call the office back at [phone].

## 2023-04-24 NOTE — Telephone Encounter (Signed)
Spoke with Sarah Morris and she states that she is not taking lomotil or anything else for her bowels. Pt states she is not having any increased pain and started taking stool softeners today and increased her fluid intake and is following Jessie's advice from yesterday. Patient states she will call the office back in 2-3 days to let us know how she is feeling. Pt had no further concerns or questions at this time.

## 2023-05-16 ENCOUNTER — Encounter: Payer: Self-pay | Admitting: Psychiatry

## 2023-05-19 ENCOUNTER — Other Ambulatory Visit (HOSPITAL_COMMUNITY)
Admission: RE | Admit: 2023-05-19 | Discharge: 2023-05-19 | Disposition: A | Discharge status: Home / Self Care | Payer: BLUE CROSS/BLUE SHIELD | Source: Ambulatory Visit | Attending: Psychiatry | Admitting: Psychiatry

## 2023-05-19 ENCOUNTER — Inpatient Hospital Stay: Payer: BLUE CROSS/BLUE SHIELD | Attending: Gynecologic Oncology | Admitting: Psychiatry

## 2023-05-19 ENCOUNTER — Other Ambulatory Visit (HOSPITAL_COMMUNITY): Payer: Self-pay

## 2023-05-19 VITALS — BP 115/94 | HR 87 | Temp 98.2°F | Ht 62.99 in | Wt 108.2 lb

## 2023-05-19 DIAGNOSIS — C539 Malignant neoplasm of cervix uteri, unspecified: Secondary | ICD-10-CM | POA: Diagnosis present

## 2023-05-19 DIAGNOSIS — N950 Postmenopausal bleeding: Secondary | ICD-10-CM | POA: Diagnosis not present

## 2023-05-19 DIAGNOSIS — Z923 Personal history of irradiation: Secondary | ICD-10-CM | POA: Diagnosis not present

## 2023-05-19 DIAGNOSIS — F17210 Nicotine dependence, cigarettes, uncomplicated: Secondary | ICD-10-CM | POA: Insufficient documentation

## 2023-05-19 DIAGNOSIS — Z9079 Acquired absence of other genital organ(s): Secondary | ICD-10-CM | POA: Insufficient documentation

## 2023-05-19 DIAGNOSIS — A5901 Trichomonal vulvovaginitis: Secondary | ICD-10-CM | POA: Insufficient documentation

## 2023-05-19 DIAGNOSIS — Z90710 Acquired absence of both cervix and uterus: Secondary | ICD-10-CM | POA: Diagnosis not present

## 2023-05-19 DIAGNOSIS — N952 Postmenopausal atrophic vaginitis: Secondary | ICD-10-CM | POA: Diagnosis not present

## 2023-05-19 DIAGNOSIS — Z9221 Personal history of antineoplastic chemotherapy: Secondary | ICD-10-CM | POA: Diagnosis not present

## 2023-05-19 DIAGNOSIS — N898 Other specified noninflammatory disorders of vagina: Secondary | ICD-10-CM | POA: Diagnosis present

## 2023-05-19 MED ORDER — ESTRADIOL 0.1 MG/GM VA CREA
TOPICAL_CREAM | VAGINAL | 11 refills | Status: DC
  Filled 2023-05-19 – 2023-09-01 (×2): qty 42.5, 90d supply, fill #0
  Filled 2023-09-30: qty 42.5, 90d supply, fill #1

## 2023-05-19 NOTE — Progress Notes (Signed)
Gynecologic Oncology Return Clinic Visit  Date of Service: 05/19/2023 Referring Provider: Gerald Leitz, MD   Assessment & Plan: Sarah Morris is a 42 y.o. 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 She presents today for surveillance.  IB2 SCC of the cervix: - completed adjuvant chemoRT 12/2022. - NED on exam today. - Signs/symptoms of recurrence reviewed. - Continue surveillance with follow-up q3 months initially. - Recommend surveillance PET. Was planning 49mo from end of treatment but given some vague abdominal symptoms, will go ahead and get it now.   Vaginal discharge: - Wet prep collected and sent - Positive for BV and trich - Pt notified. Pt instructed on abstinence until both she and her partner complete treatment for trich. - Metronidazole sent.   Vaginal dryness: - Discussed treatment with moisturizer with coconut oil. - Pt also interested in vaginal estrogen cream. Rx sent.    RTC Rad onc 73mo, Gyn onc 49mo  Clide Cliff, MD Gynecologic Oncology   Medical Decision Making I personally spent  TOTAL 30 minutes face-to-face and non-face-to-face in the care of this patient, which includes all pre, intra, and post visit time on the date of service.   ----------------------- Reason for Visit: Surveillance  Treatment History: Oncology History Overview Note  CPS score 8% for PD-L1   Cervical cancer, FIGO stage IB1 (HCC)  05/07/2022 Imaging   IMPRESSION: 1. No acute process identified. 2. Hepatic hemangioma and a subcentimeter hypodensity which may represent cyst or hemangioma. 3. Fibroid uterus.   06/10/2022 Procedure   Pap: HSIL, HPV 16+   07/24/2022 Initial Biopsy      08/23/2022 Imaging   PET:   IMPRESSION: 1. Marked ill-defined cervical FDG avidity consistent with primary cervical neoplasm. 2. No hypermetabolic abdominopelvic adenopathy or convincing evidence of hypermetabolic distant metastatic disease. 3. Very small volume pelvic free fluid does not demonstrate significant abnormal FDG avidity and there is no discrete hypermetabolic peritoneal nodularity, volume is within physiologic normal limits but is new from prior imaging. Suggest attention on follow-up imaging. 4. Scattered nodular areas of hypermetabolic uterine activity most notably involving a nodular focus in the uterine fundus with a max SUV of 4.33, commonly reflecting uterine fibroids. 5. Aortic Atherosclerosis (ICD10-I70.0) and Emphysema (ICD10-J43.9).   09/10/2022 Surgery   Radical abdominal hysterectomy with upper vaginectomy (2cm), bilateral salpingectomy, bilateral pelvic lymphadenectomy (Class C1 radical hysterectomy)    09/10/2022 Cancer Staging   Staging form: Cervix Uteri, AJCC Version 9 - Pathologic stage from 09/10/2022: FIGO Stage IB2 (pT1b2, pN0, cM0) - Signed by Clide Cliff, MD on 09/23/2022 Histopathologic type: Squamous cell carcinoma, NOS Stage prefix: Initial diagnosis Histologic grade (G): G3 Histologic grading system: 3 grade system Residual tumor (R): R0 - None Lymph-vascular invasion (LVI): BOTH lymphatic and small vessel AND venous (large vessel) invasion    Pathology Results   FINAL MICROSCOPIC DIAGNOSIS: A. LYMPH NODES, RIGHT PELVIC, RESECTION: - Five lymph nodes with one showing evidence of endosalpingosis, negative for carcinoma (0/5) B. LYMPH NODES, LEFT PELVIC, RESECTION: - Four lymph nodes with some showing evidence of endosalpingosis, negative for carcinoma (0/4) C. ADHERENT OMENTUM, EXCISION: - Portion of omentum, negative for carcinoma D. UTERUS, CERVIX, BILATERAL FALLOPIAN TUBES, UPPER 2CM OF VAGINA, EXCISION: - Invasive moderate to poorly differentiated squamous  cell carcinoma, 2.4 cm, involving all quadrants of  the cervix - Carcinoma invades into the stroma for a depth of 1.2 cm (of 1.7cm, >50%) - Lymphovascular invasion is identified - Resection margins are negative for carcinoma - Uterus with benign inactive endometrium and benign leiomyomata, up to 2.2 cm - Benign unremarkable bilateral fallopian tubes - Portion of vagina, negative for carcinoma - See oncology table    09/10/2022 Pathology Results      10/18/2022 Imaging     11/20/2022 Procedure   Placement of single lumen port a cath via right internal jugular vein. The catheter tip lies at the cavo-atrial junction. A power injectable port a cath was placed and is ready for immediate use   11/22/2022 - 12/20/2022 Chemotherapy   Patient is on Treatment Plan : Cervical Cisplatin (40) q7d     01/09/2023 Procedure   Technically successful tunneled Port catheter removal.      Interval History: Pt reports that she has been experiencing vaginal discharge x3 weeks. Denies odor. Reports she received some metrogel in April from Dr. Roselind Messier and this did help her vaginal discharge symptoms at that time. She otherwise notes some upper abdominal cramping for a week. She is adjusting her diet to help. She otherwise denies new abdominal bloating, early satiety, significant weight loss, change in bowel or bladder habits. She is using stool softener and occasional miralax to help with bowel movements.    Past Medical/Surgical History: Past Medical History:  Diagnosis Date   Alcoholism (HCC)    Cervical cancer (HCC)    Ectopic pregnancy    multiple   History of radiation therapy    Cervix- 11/25/22-01/10/23-Dr. Antony Blackbird    Past Surgical History:  Procedure Laterality Date   ABDOMINAL HYSTERECTOMY     BILATERAL SALPINGECTOMY N/A 09/10/2022   Procedure: OPEN BILATERAL SALPINGECTOMY;  Surgeon: Clide Cliff, MD;  Location: [facility];  Service: Gynecology;  Laterality: N/A;   DILATION AND CURETTAGE  OF UTERUS     IR IMAGING GUIDED PORT INSERTION  11/20/2022   IR REMOVAL TUN ACCESS W/ PORT W/O FL MOD SED  01/08/2023   LYMPH NODE DISSECTION N/A 09/10/2022   Procedure: PELVIC LYMPH NODE DISSECTION;  Surgeon: Clide Cliff, MD;  Location: [facility];  Service: Gynecology;  Laterality: N/A;   RADICAL HYSTERECTOMY N/A 09/10/2022   Procedure: OPEN RADICAL HYSTERECTOMY;  Surgeon: Clide Cliff, MD;  Location: [facility];  Service: Gynecology;  Laterality: N/A;    Family History  Problem Relation Age of Onset   Hypertension Mother    Diabetes Maternal Grandmother    Cancer Maternal Grandfather    Lung cancer Maternal Grandfather    Cancer Paternal Grandfather    Diabetes Cousin    Stomach cancer Neg Hx    Esophageal cancer Neg Hx    Colon cancer Neg Hx     Social History   Socioeconomic History   Marital status: Single    Spouse name: Not on file   Number of children: Not on file   Years of education: Not on file   Highest education level: Not on file  Occupational History   Occupation: handler  Tobacco Use   Smoking status: Every Day    Packs/day: .5    Types: Cigarettes    Start date: 2008   Smokeless tobacco: Never  Vaping Use   Vaping Use: Never used  Substance and Sexual Activity   Alcohol use: Yes    Alcohol/week: 25.0 standard drinks of alcohol    Types: 25 Cans of beer per week  Comment: occassionally   Drug use: Yes    Frequency: 7.0 times per week    Types: Marijuana   Sexual activity: Yes  Other Topics Concern   Not on file  Social History Narrative   Not on file   Social Determinants of Health   Financial Resource Strain: High Risk (08/19/2022)   Overall Financial Resource Strain (CARDIA)    Difficulty of Paying Living Expenses: Hard  Food Insecurity: Food Insecurity Present (09/12/2022)   Hunger Vital Sign    Worried About Running Out of Food in the Last Year: Often true    Ran Out of Food in the Last Year: Sometimes true  Transportation Needs: No  Transportation Needs (08/19/2022)   PRAPARE - Administrator, Civil Service (Medical): No    Lack of Transportation (Non-Medical): No  Physical Activity: Not on file  Stress: Not on file  Social Connections: Not on file    Current Medications:  Current Outpatient Medications:    metroNIDAZOLE (METROGEL) 0.75 % vaginal gel, Place 1 Applicatorful vaginally daily. (Patient not taking: Reported on 05/16/2023), Disp: 70 g, Rfl: 0  Review of Symptoms: Complete 10-system review is negative except as above in Interval History.  Physical Exam: LMP 08/27/2022 Comment: Spotted four days, 08-27-22 to 08-31-22;09-10-22 UA preg test negative General: Alert, oriented, no acute distress. HEENT: Normocephalic, atraumatic. Neck symmetric without masses. Sclera anicteric.  Chest: Normal work of breathing. Clear to auscultation bilaterally.   Cardiovascular: Regular rate and rhythm, no murmurs. Abdomen: Soft, nontender.  Normoactive bowel sounds.  No masses or hepatosplenomegaly appreciated.  Well-healed scar. Extremities: Grossly normal range of motion.  Warm, well perfused.  No edema bilaterally. Skin: No rashes or lesions noted. Lymphatics: No cervical, supraclavicular, or inguinal adenopathy. GU: Normal appearing external genitalia without erythema, excoriation, or lesions.  Speculum exam reveals normal vaginal cuff. Atrophic changes. Narrowing at vaginal apex. Yellowish white opaque discharge.  Bimanual exam reveals smooth vaginal cuff.  Rectovaginal exam reveals firmness in rectovaginal septum at apex of vaginal cuff, indurated. Exam chaperoned by Andrey Cota, RN   Laboratory & Radiologic Studies: Component 6 d ago  Trichomonas Positive Abnormal   Bacterial Vaginitis (gardnerella) Positive Abnormal   Candida Vaginitis Negative  Candida Glabrata Negative  Comment Normal Reference Range Bacterial Vaginosis - Negative  Comment Normal Reference Range Candida Species - Negative  Comment  Normal Reference Range Candida Galbrata - Negative  Comment Normal Reference Range Trichomonas - Negative

## 2023-05-19 NOTE — Patient Instructions (Addendum)
It was a pleasure to see you in clinic today. - Try coconut oil in the vagina nightly. Wear a pad or panty liner and then clean off in the morning. You can use on nights when you are not using the vaginal estrogen.  - Return visit planned for 27mo  Thank you very much for allowing me to provide care for you today.  I appreciate your confidence in choosing our Gynecologic Oncology team at [facility].  If you have any questions about your visit today please call our office or send [place] a MyChart message and we will get back to you as soon as possible.

## 2023-05-20 ENCOUNTER — Other Ambulatory Visit: Payer: Self-pay | Admitting: Psychiatry

## 2023-05-20 DIAGNOSIS — N760 Acute vaginitis: Secondary | ICD-10-CM

## 2023-05-20 DIAGNOSIS — A599 Trichomoniasis, unspecified: Secondary | ICD-10-CM

## 2023-05-20 LAB — CERVICOVAGINAL ANCILLARY ONLY
Bacterial Vaginitis (gardnerella): POSITIVE — AB
Candida Glabrata: NEGATIVE
Candida Vaginitis: NEGATIVE
Comment: NEGATIVE
Comment: NEGATIVE
Comment: NEGATIVE
Comment: NEGATIVE
Trichomonas: POSITIVE — AB

## 2023-05-20 MED ORDER — METRONIDAZOLE 500 MG PO TABS: 500.0000 mg | ORAL_TABLET | Freq: Two times a day (BID) | ORAL | 0 refills | Status: AC

## 2023-05-20 MED ORDER — FLUCONAZOLE 150 MG PO TABS: 150.0000 mg | ORAL_TABLET | Freq: Once | ORAL | 0 refills | Status: AC

## 2023-05-25 ENCOUNTER — Encounter: Payer: Self-pay | Admitting: Psychiatry

## 2023-05-26 ENCOUNTER — Ambulatory Visit: Payer: Self-pay | Admitting: Radiation Oncology

## 2023-05-28 ENCOUNTER — Other Ambulatory Visit (HOSPITAL_COMMUNITY): Payer: Self-pay

## 2023-06-03 ENCOUNTER — Telehealth: Payer: Self-pay | Admitting: *Deleted

## 2023-06-03 NOTE — Telephone Encounter (Signed)
Spoke with the patient regarding her PET scan not be approved for the [facility] facilities. Explained that her insurance will authorize the scan to be done at [facility]. Our office [facility] several [facility] locations in [place] per patient request and only [facility] facility in the area that does out patient PET scan is in [place]. Patient stated that she is unable to get transportation to [place]. Patient stated that she will pay out of pocket and wants tp be billed for the scan.

## 2023-06-05 ENCOUNTER — Encounter (HOSPITAL_COMMUNITY): Payer: BLUE CROSS/BLUE SHIELD

## 2023-06-05 ENCOUNTER — Encounter (HOSPITAL_COMMUNITY): Payer: Self-pay

## 2023-06-05 ENCOUNTER — Telehealth: Payer: Self-pay | Admitting: Nutrition

## 2023-06-05 NOTE — Telephone Encounter (Signed)
Patient is aware of upcoming appointment times/dates.  

## 2023-06-09 ENCOUNTER — Telehealth: Payer: Self-pay | Admitting: Oncology

## 2023-06-09 NOTE — Telephone Encounter (Signed)
Faxed order and insurance authorization to [facility] services.

## 2023-06-09 NOTE — Telephone Encounter (Signed)
Called [facility] and they do PET imaging.  We will need to fax a signed order and insurance authorization to [phone] before scheduling.  Called Sarah Morris and she is able to go to [facility] for the PET scan.  Advised her that we will work on authorization and call her when it is scheduled.

## 2023-06-10 NOTE — Telephone Encounter (Signed)
Gwen called back and confirmed the appointment on 06/18/23.

## 2023-06-10 NOTE — Telephone Encounter (Signed)
Sarah Morris called and asked for me to schedule her PET scan.  She said she can now go to [facility] or [facility].  Advised that she will need to go to [facility] since that is where her insurance authorized the scan.    Called [facility] and appointment is scheduled for 06/18/23 at 10:00 with 9:30 arrival.  She will need to be NPO 6 hours before except for water.  Left a message for [facility] with appointment and directions.  Requested a return call to confirm.

## 2023-06-19 ENCOUNTER — Other Ambulatory Visit: Payer: Self-pay

## 2023-06-19 ENCOUNTER — Encounter: Payer: Self-pay | Admitting: Nutrition

## 2023-06-19 ENCOUNTER — Inpatient Hospital Stay: Payer: BLUE CROSS/BLUE SHIELD | Admitting: Nutrition

## 2023-06-19 NOTE — Progress Notes (Signed)
Patient checked in with registration at 1:27 pm on Aug 1 for a 2:30 pm appointment. This RD searched for patient at appointment time but could not find her. I checked all 4 waiting areas and she was not there.   Contacted front desk who states patient left saying if RD could not meet with her right then, she was leaving. (Sometime after arrival)  Patient is no longer under treatment. This appointment was scheduled at her request as a courtesy follow up. Will not reschedule.

## 2023-06-24 ENCOUNTER — Inpatient Hospital Stay
Admission: RE | Admit: 2023-06-24 | Discharge: 2023-06-24 | Disposition: A | Discharge status: Home / Self Care | Payer: Self-pay | Source: Ambulatory Visit | Attending: Gynecologic Oncology | Admitting: Gynecologic Oncology

## 2023-06-24 ENCOUNTER — Encounter: Payer: Self-pay | Admitting: Oncology

## 2023-06-24 DIAGNOSIS — C539 Malignant neoplasm of cervix uteri, unspecified: Secondary | ICD-10-CM

## 2023-06-24 NOTE — Progress Notes (Signed)
Requested powershare of PET done on 06/18/23 at [facility].

## 2023-06-27 ENCOUNTER — Telehealth: Payer: Self-pay | Admitting: *Deleted

## 2023-06-27 NOTE — Telephone Encounter (Addendum)
Spoke with Sarah Morris who called the office asking about her Pet Scan results. Pt is also complaining of increased cramping with regular bowel movements that are soft and runny. Pt denies fever, chills, nausea,vomiting and vaginal discharge. Pt advised that her message would be relayed to providers.  Relayed message from Sarah Mccreedy, NP that pt's Pet Scan showed no cancer return, "good news".  Pt advised to follow up with Sarah Morris on October 15 th, radiation treatments could have irritated her bowels and starting a good bowel regime is recommended by adding some healthy fiber with fruits and vegetables, add stool softeners and monitor symptoms and call with any concerns or questions.

## 2023-08-30 NOTE — Progress Notes (Signed)
Radiation Oncology         [phone] ________________________________  Name: Sarah Morris MRN: [id number]  Date: 09/01/2023  DOB: December 20, 1980  Follow-Up Visit Note  CC: Doylene Bode, NP  Clide Cliff, MD  No diagnosis found.  Diagnosis: The encounter diagnosis was Cervical cancer, FIGO stage IB1 (HCC).   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 She was last seen here for follow-up on 02/20/23.      Since her last visit, the patient followed up with Dr. Alvester Morin on 05/19/23. GU exam performed at that time was notable for some atrophy, narrowing at the vaginal apex, some yellowish white opaque vaginal discharge, and firmness with induration in rectovaginal septum at the apex of vaginal cuff. Due to yellowish discharge, labs were obtained which came back positive for BV and trich. She was subsequently started on treatment for trich and instructed to practice abstinence until her and her partner completed treatment.         Pertinent imaging performed in the interval includes a restaging PET scan w/ low does CT on 06/18/23 which showed no evidence of residual or recurrent disease in the pelvis, and no evidence of metastatic disease in the visualized body.       ***              Allergies:  is allergic to black pepper-turmeric, food, bacitracin-polymyxin b, and neosporin [neomycin-bacitracin zn-polymyx].  Meds: Current Outpatient Medications  Medication Sig  Dispense Refill   estradiol (ESTRACE VAGINAL) 0.1 MG/GM vaginal cream Place 1 Applicatorful vaginally at bedtime for 14 days, THEN 1 Applicatorful 2 (two) times a week. 42.5 g 11   No current facility-administered medications for this encounter.    Physical Findings: The patient is in no acute distress. Patient is alert and oriented.  vitals were not taken for this visit. .  No significant changes. Lungs are clear to auscultation bilaterally. Heart has regular rate and rhythm. No palpable cervical, supraclavicular, or axillary adenopathy. Abdomen soft, non-tender, normal bowel sounds.  On pelvic examination the external genitalia were unremarkable. A speculum exam was performed. There are no mucosal lesions noted in the vaginal vault. A Pap smear was obtained of the proximal vagina. On bimanual and rectovaginal examination there were no pelvic masses appreciated. ***   Lab Findings: Lab Results  Component Value Date   WBC 2.1 (L) 12/25/2022   HGB 10.9 (L) 12/25/2022   HCT 31.5 (L) 12/25/2022   MCV 96.6 12/25/2022   PLT 102 (L) 12/25/2022    Radiographic Findings: No results found.  Impression: The encounter diagnosis was Cervical cancer, FIGO stage IB1 (HCC).   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  ***  Plan:  ***   *** minutes of total time was spent for this patient encounter, including preparation, face-to-face counseling with the patient  and coordination of care, physical exam, and documentation of the encounter. ____________________________________  Billie Lade, PhD, MD  This document serves as a record of services personally performed by Antony Blackbird, MD. It was created on his behalf by Neena Rhymes, a trained medical scribe. The creation  of this record is based on the scribe's personal observations and the provider's statements to them. This document has been checked and approved by the attending provider.

## 2023-09-01 ENCOUNTER — Encounter: Payer: Self-pay | Admitting: Radiation Oncology

## 2023-09-01 ENCOUNTER — Other Ambulatory Visit (HOSPITAL_COMMUNITY): Payer: Self-pay

## 2023-09-01 ENCOUNTER — Ambulatory Visit
Admission: RE | Admit: 2023-09-01 | Discharge: 2023-09-01 | Disposition: A | Discharge status: Home / Self Care | Payer: BLUE CROSS/BLUE SHIELD | Source: Ambulatory Visit | Attending: Radiation Oncology | Admitting: Radiation Oncology

## 2023-09-01 ENCOUNTER — Other Ambulatory Visit: Payer: Self-pay

## 2023-09-01 VITALS — BP 122/93 | HR 83 | Temp 97.1°F | Resp 18 | Ht 62.0 in | Wt 107.5 lb

## 2023-09-01 DIAGNOSIS — N898 Other specified noninflammatory disorders of vagina: Secondary | ICD-10-CM | POA: Insufficient documentation

## 2023-09-01 DIAGNOSIS — Z8541 Personal history of malignant neoplasm of cervix uteri: Secondary | ICD-10-CM | POA: Insufficient documentation

## 2023-09-01 DIAGNOSIS — Z90722 Acquired absence of ovaries, bilateral: Secondary | ICD-10-CM | POA: Insufficient documentation

## 2023-09-01 DIAGNOSIS — Z90710 Acquired absence of both cervix and uterus: Secondary | ICD-10-CM | POA: Diagnosis not present

## 2023-09-01 DIAGNOSIS — C530 Malignant neoplasm of endocervix: Secondary | ICD-10-CM

## 2023-09-01 DIAGNOSIS — R300 Dysuria: Secondary | ICD-10-CM | POA: Insufficient documentation

## 2023-09-01 DIAGNOSIS — Z923 Personal history of irradiation: Secondary | ICD-10-CM | POA: Diagnosis not present

## 2023-09-01 DIAGNOSIS — C539 Malignant neoplasm of cervix uteri, unspecified: Secondary | ICD-10-CM

## 2023-09-01 LAB — URINALYSIS, COMPLETE (UACMP) WITH MICROSCOPIC
Bilirubin Urine: NEGATIVE
Glucose, UA: NEGATIVE mg/dL
Ketones, ur: NEGATIVE mg/dL
Leukocytes,Ua: NEGATIVE
Nitrite: NEGATIVE
Protein, ur: NEGATIVE mg/dL
Specific Gravity, Urine: 1.018 (ref 1.005–1.030)
pH: 5 (ref 5.0–8.0)

## 2023-09-01 LAB — WET PREP, GENITAL
Sperm: NONE SEEN
Trich, Wet Prep: NONE SEEN
WBC, Wet Prep HPF POC: <10
Yeast Wet Prep HPF POC: NONE SEEN

## 2023-09-01 NOTE — Progress Notes (Addendum)
Sarah Morris is here today for follow up post radiation to the pelvic.  They completed their radiation on: 01/10/23   Does the patient complain of any of the following:  Pain: Yes, patient reports pain to pelvis. Patient reports increased pressure with urination and bowel movements Abdominal bloating: No Diarrhea/Constipation: Soft stools Nausea/Vomiting: No Vaginal Discharge: Yes,clear discharge.  Blood in Urine or Stool: No Urinary Issues (dysuria/incomplete emptying/ incontinence/ increased frequency/urgency): Yes, intermittent dysuria and urgency.  Does patient report using vaginal dilator 2-3 times a week and/or sexually active 2-3 weeks: Yes,both dilators and sexually active. Post radiation skin changes:  No   Additional comments if applicable:  BP (!) 122/93   Pulse 83   Temp (!) 97.1 F (36.2 C) (Temporal)   Resp 18   Ht 5\' 2"  (1.575 m)   Wt 107 lb 8 oz (48.8 kg)   LMP 08/27/2022 Comment: Spotted four days, 08-27-22 to 08-31-22;09-10-22 UA preg test negative  SpO2 100%   BMI 19.66 kg/m

## 2023-09-03 ENCOUNTER — Inpatient Hospital Stay: Payer: BLUE CROSS/BLUE SHIELD | Attending: Gynecologic Oncology

## 2023-09-03 ENCOUNTER — Other Ambulatory Visit: Payer: Self-pay | Admitting: Radiation Oncology

## 2023-09-03 LAB — URINE CULTURE: Culture: 20000 — AB

## 2023-09-03 MED ORDER — NITROFURANTOIN MONOHYD MACRO 100 MG PO CAPS: 100.0000 mg | ORAL_CAPSULE | Freq: Two times a day (BID) | ORAL | 0 refills | Status: DC

## 2023-09-03 MED ORDER — METRONIDAZOLE 500 MG PO TABS: 500.0000 mg | ORAL_TABLET | Freq: Two times a day (BID) | ORAL | 0 refills | Status: DC

## 2023-09-03 NOTE — Progress Notes (Signed)
[facility] CSW Progress Note  Clinical Child psychotherapist returned patient voicemail about Personal assistant. Patient stated purpose for call was to follow up on previous application completed by a Child psychotherapist. CSW reviewed notes and informed patient that original social worker is not on leave and a medicaid application does not appear to be completed on our end. CSW provided contact for J. Cooper at [facility] for patient to reach out for status and reapplication if needed.    Marguerita Merles, LCSWA Clinical Social Worker [facility]

## 2023-09-30 ENCOUNTER — Other Ambulatory Visit: Payer: Self-pay

## 2023-09-30 ENCOUNTER — Other Ambulatory Visit (HOSPITAL_COMMUNITY): Payer: Self-pay

## 2023-11-10 ENCOUNTER — Telehealth: Payer: Self-pay

## 2023-11-10 ENCOUNTER — Telehealth: Payer: Self-pay | Admitting: *Deleted

## 2023-11-10 NOTE — Telephone Encounter (Signed)
Called patient to inform of fu appt. with Dr. Alvester Morin on 12-08-23 - arrival time- 3:15 pm, spoke with patient and she is aware of this appt.

## 2023-11-10 NOTE — Telephone Encounter (Signed)
Sarah Morris from (RAD ONC) called office.  Pt is scheduled for a follow up on 12/08/23  at 3:30 with Dr.Newton.  Sarah Morris to notify pt of appointment date and time.

## 2023-12-03 ENCOUNTER — Other Ambulatory Visit: Payer: Self-pay | Admitting: Gynecologic Oncology

## 2023-12-03 ENCOUNTER — Telehealth: Payer: Self-pay

## 2023-12-03 DIAGNOSIS — N952 Postmenopausal atrophic vaginitis: Secondary | ICD-10-CM

## 2023-12-03 MED ORDER — ESTRADIOL 0.1 MG/GM VA CREA: TOPICAL_CREAM | VAGINAL | 11 refills | Status: DC

## 2023-12-03 NOTE — Telephone Encounter (Signed)
 I connected with Sarah Morris regarding meaningful use information update for upcoming appointment with Dr.Newton on 1/20. She reports not taking the Estradiol  vaginal cream because she has not had time to pick it up. She is wanting the prescription transferred to [facility] [address]. That pharmacy is closer to her house.   Pt aware I will notify the provider.

## 2023-12-03 NOTE — Telephone Encounter (Signed)
 PA request received from CoverMyMeds on Estradiol  vaginal cream. PA has been started, will wait on outcome.

## 2023-12-04 ENCOUNTER — Other Ambulatory Visit: Payer: Self-pay | Admitting: Gynecologic Oncology

## 2023-12-04 ENCOUNTER — Telehealth: Payer: Self-pay

## 2023-12-04 DIAGNOSIS — N952 Postmenopausal atrophic vaginitis: Secondary | ICD-10-CM

## 2023-12-04 MED ORDER — ESTROGENS CONJUGATED 0.625 MG/GM VA CREA: TOPICAL_CREAM | VAGINAL | 12 refills | Status: AC

## 2023-12-04 NOTE — Telephone Encounter (Signed)
Sarah Morris is aware of new medication being sent to her pharmacy. She states she will pick med up.

## 2023-12-04 NOTE — Telephone Encounter (Signed)
Prior authorization request received from CoverMyMeds on Estradiol 0.1mg /gm cream.   PA started and sent to insurance plan.   Outcome was denied d/t patient must first try and fail  Premarin cream, Estring (brand) or Vagifem. If patient has tried these (I did not see in pt's medication history) and medications caused side effects documentation must be sent in.   Message sent to Sarah Mccreedy NP, for advice

## 2023-12-04 NOTE — Progress Notes (Signed)
See CMA note. Estrace not covered by insurance. Premarin sent in instead.

## 2023-12-08 ENCOUNTER — Inpatient Hospital Stay: Payer: Medicaid Other | Attending: Gynecologic Oncology | Admitting: Psychiatry

## 2023-12-08 ENCOUNTER — Encounter: Payer: Self-pay | Admitting: Psychiatry

## 2023-12-08 VITALS — BP 109/77 | HR 76 | Temp 98.7°F | Resp 17 | Ht 62.0 in | Wt 109.6 lb

## 2023-12-08 DIAGNOSIS — Z79890 Hormone replacement therapy (postmenopausal): Secondary | ICD-10-CM | POA: Diagnosis not present

## 2023-12-08 DIAGNOSIS — R232 Flushing: Secondary | ICD-10-CM

## 2023-12-08 DIAGNOSIS — Z90710 Acquired absence of both cervix and uterus: Secondary | ICD-10-CM | POA: Insufficient documentation

## 2023-12-08 DIAGNOSIS — N951 Menopausal and female climacteric states: Secondary | ICD-10-CM | POA: Insufficient documentation

## 2023-12-08 DIAGNOSIS — Z8541 Personal history of malignant neoplasm of cervix uteri: Secondary | ICD-10-CM | POA: Diagnosis not present

## 2023-12-08 DIAGNOSIS — Z923 Personal history of irradiation: Secondary | ICD-10-CM | POA: Insufficient documentation

## 2023-12-08 DIAGNOSIS — Z9079 Acquired absence of other genital organ(s): Secondary | ICD-10-CM | POA: Insufficient documentation

## 2023-12-08 DIAGNOSIS — Z9221 Personal history of antineoplastic chemotherapy: Secondary | ICD-10-CM | POA: Insufficient documentation

## 2023-12-08 DIAGNOSIS — Z08 Encounter for follow-up examination after completed treatment for malignant neoplasm: Secondary | ICD-10-CM | POA: Insufficient documentation

## 2023-12-08 DIAGNOSIS — C539 Malignant neoplasm of cervix uteri, unspecified: Secondary | ICD-10-CM

## 2023-12-08 MED ORDER — ESTRADIOL 1 MG PO TABS: 1.0000 mg | ORAL_TABLET | Freq: Every day | ORAL | 5 refills | Status: AC

## 2023-12-08 NOTE — Patient Instructions (Signed)
It was a pleasure to see you in clinic today. - Normal exam today - Repeat pet ordered - Estrogen pill sent to pharmacy - Return visit planned for 6 months (you see Dr. Roselind Messier in 3 months).  Thank you very much for allowing me to provide care for you today.  I appreciate your confidence in choosing our Gynecologic Oncology team at [facility].  If you have any questions about your visit today please call our office or send [place] a MyChart message and we will get back to you as soon as possible.

## 2023-12-08 NOTE — Progress Notes (Signed)
Gynecologic Oncology Return Clinic Visit  Date of Service: 12/08/2023 Referring Provider: Gerald Leitz, MD   Assessment & Plan: Sarah Morris is a 43 y.o. 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 She presents today for surveillance.  IB2 SCC of the cervix: - completed adjuvant chemoRT 12/2022. - NED on exam today. - Signs/symptoms of recurrence reviewed. - Continue surveillance with follow-up q3 months initially. Can alternate with Dr. Roselind Messier. Sees Dr. Roselind Messier 03/01/24. - Recommend surveillance PET. PET 06/18/23 completed outside was NED. Repeat now (q61mo). If negative, could space out to yearly.   Vaginal dryness: - Improved with vaginal estrogen. Continue. May be able to decrease following initiation of oral estrogen per below.  Hotflashes: - PO estrogen 1mg  daily rx sent.   RTC Rad onc 61mo, Gyn onc 56mo   Clide Cliff, MD Gynecologic Oncology   Medical Decision Making I personally spent  TOTAL 20 minutes face-to-face and non-face-to-face in the care of this patient, which includes all pre, intra, and post visit time on the date of service.   ----------------------- Reason for Visit: Surveillance  Treatment History: Oncology History Overview Note  CPS score 8% for PD-L1   Cervical cancer, FIGO stage IB1 (HCC)  05/07/2022 Imaging   IMPRESSION: 1. No acute process identified. 2. Hepatic hemangioma and a subcentimeter hypodensity which may represent cyst or hemangioma. 3. Fibroid uterus.   06/10/2022 Procedure   Pap: HSIL, HPV 16+   07/24/2022 Initial Biopsy      08/23/2022 Imaging   PET:  IMPRESSION: 1. Marked ill-defined cervical FDG avidity consistent with primary cervical neoplasm. 2. No hypermetabolic  abdominopelvic adenopathy or convincing evidence of hypermetabolic distant metastatic disease. 3. Very small volume pelvic free fluid does not demonstrate significant abnormal FDG avidity and there is no discrete hypermetabolic peritoneal nodularity, volume is within physiologic normal limits but is new from prior imaging. Suggest attention on follow-up imaging. 4. Scattered nodular areas of hypermetabolic uterine activity most notably involving a nodular focus in the uterine fundus with a max SUV of 4.33, commonly reflecting uterine fibroids. 5. Aortic Atherosclerosis (ICD10-I70.0) and Emphysema (ICD10-J43.9).   09/10/2022 Surgery   Radical abdominal hysterectomy with upper vaginectomy (2cm), bilateral salpingectomy, bilateral pelvic lymphadenectomy (Class C1 radical hysterectomy)    09/10/2022 Cancer Staging   Staging form: Cervix Uteri, AJCC Version 9 - Pathologic stage from 09/10/2022: FIGO Stage IB2 (pT1b2, pN0, cM0) - Signed by Clide Cliff, MD on 09/23/2022 Histopathologic type: Squamous cell carcinoma, NOS Stage prefix: Initial diagnosis Histologic grade (G): G3 Histologic grading system: 3 grade system Residual tumor (R): R0 - None Lymph-vascular invasion (LVI): BOTH lymphatic and small vessel AND venous (large vessel) invasion    Pathology Results   FINAL MICROSCOPIC DIAGNOSIS: A. LYMPH NODES, RIGHT PELVIC, RESECTION: - Five lymph nodes with one showing evidence of endosalpingosis, negative for carcinoma (0/5) B. LYMPH NODES, LEFT PELVIC, RESECTION: - Four lymph nodes with some showing evidence of endosalpingosis, negative for carcinoma (0/4) C. ADHERENT OMENTUM, EXCISION: - Portion of omentum, negative for carcinoma D. UTERUS, CERVIX, BILATERAL FALLOPIAN TUBES, UPPER 2CM OF VAGINA, EXCISION: - Invasive moderate to poorly differentiated squamous cell carcinoma, 2.4 cm, involving all quadrants of the cervix - Carcinoma invades into the stroma for a depth of 1.2 cm  (of 1.7cm, >50%) - Lymphovascular invasion is identified -  Resection margins are negative for carcinoma - Uterus with benign inactive endometrium and benign leiomyomata, up to 2.2 cm - Benign unremarkable bilateral fallopian tubes - Portion of vagina, negative for carcinoma - See oncology table    09/10/2022 Pathology Results      10/18/2022 Imaging     11/20/2022 Procedure   Placement of single lumen port a cath via right internal jugular vein. The catheter tip lies at the cavo-atrial junction. A power injectable port a cath was placed and is ready for immediate use   11/22/2022 - 12/20/2022 Chemotherapy   Patient is on Treatment Plan : Cervical Cisplatin (40) q7d     01/09/2023 Procedure   Technically successful tunneled Port catheter removal.      Interval History: Patient reports that she has overall been doing well.  She quit smoking cigarettes (does use marijuana).  Feels some irritation with peeing or feeling like she needs to pee.  This is not new but has been ongoing since treatment.  She restarted her vaginal estrogen cream which helps some.  Otherwise denies vaginal bleeding, change in bowel or bladder habits, abdominal bloating, early satiety, significant weight loss.  She does have hot flashes that are worsening.  She lost her vaginal dilators but would like new ones.    Past Medical/Surgical History: Past Medical History:  Diagnosis Date   Alcoholism (HCC)    Cervical cancer (HCC)    Ectopic pregnancy    multiple   History of radiation therapy    Cervix- 11/25/22-01/10/23-Dr. Antony Blackbird    Past Surgical History:  Procedure Laterality Date   ABDOMINAL HYSTERECTOMY     BILATERAL SALPINGECTOMY N/A 09/10/2022   Procedure: OPEN BILATERAL SALPINGECTOMY;  Surgeon: Clide Cliff, MD;  Location: [facility];  Service: Gynecology;  Laterality: N/A;   DILATION AND CURETTAGE OF UTERUS     IR IMAGING GUIDED PORT INSERTION  11/20/2022   IR REMOVAL TUN ACCESS W/ PORT W/O FL MOD  SED  01/08/2023   LYMPH NODE DISSECTION N/A 09/10/2022   Procedure: PELVIC LYMPH NODE DISSECTION;  Surgeon: Clide Cliff, MD;  Location: [facility];  Service: Gynecology;  Laterality: N/A;   RADICAL HYSTERECTOMY N/A 09/10/2022   Procedure: OPEN RADICAL HYSTERECTOMY;  Surgeon: Clide Cliff, MD;  Location: [facility];  Service: Gynecology;  Laterality: N/A;    Family History  Problem Relation Age of Onset   Hypertension Mother    Diabetes Maternal Grandmother    Cancer Maternal Grandfather    Lung cancer Maternal Grandfather    Cancer Paternal Grandfather    Diabetes Cousin    Stomach cancer Neg Hx    Esophageal cancer Neg Hx    Colon cancer Neg Hx     Social History   Socioeconomic History   Marital status: Single    Spouse name: Not on file   Number of children: Not on file   Years of education: Not on file   Highest education level: Not on file  Occupational History   Occupation: handler  Tobacco Use   Smoking status: Every Day    Current packs/day: 0.50    Average packs/day: 0.5 packs/day for 17.1 years (8.5 ttl pk-yrs)    Types: Cigarettes    Start date: 2008   Smokeless tobacco: Never  Vaping Use   Vaping status: Never Used  Substance and Sexual Activity   Alcohol use: Yes    Alcohol/week: 25.0 standard drinks of alcohol    Types: 25 Cans of beer per week  Comment: occassionally   Drug use: Yes    Frequency: 7.0 times per week    Types: Marijuana   Sexual activity: Yes  Other Topics Concern   Not on file  Social History Narrative   Not on file   Social Drivers of Health   Financial Resource Strain: High Risk (08/19/2022)   Overall Financial Resource Strain (CARDIA)    Difficulty of Paying Living Expenses: Hard  Food Insecurity: Food Insecurity Present (09/12/2022)   Hunger Vital Sign    Worried About Running Out of Food in the Last Year: Often true    Ran Out of Food in the Last Year: Sometimes true  Transportation Needs: No Transportation Needs  (08/19/2022)   PRAPARE - Administrator, Civil Service (Medical): No    Lack of Transportation (Non-Medical): No  Physical Activity: Not on file  Stress: Not on file  Social Connections: Not on file    Current Medications:  Current Outpatient Medications:    estradiol (ESTRACE) 1 MG tablet, Take 1 tablet (1 mg total) by mouth daily., Disp: 30 tablet, Rfl: 5   conjugated estrogens (PREMARIN) vaginal cream, Place 1 Applicatorful vaginally at bedtime for 14 days, THEN 1 Applicatorful 2 (two) times a week., Disp: 42.5 g, Rfl: 12  Review of Symptoms: Complete 10-system review is negative except as above in Interval History.  Physical Exam: BP 109/77 (BP Location: Left Arm, Patient Position: Sitting)   Pulse 76   Temp 98.7 F (37.1 C) (Oral)   Resp 17   Ht 5\' 2"  (1.575 m)   Wt 109 lb 9.6 oz (49.7 kg)   LMP 08/27/2022 Comment: Spotted four days, 08-27-22 to 08-31-22;09-10-22 UA preg test negative  SpO2 100%   BMI 20.05 kg/m  General: Alert, oriented, no acute distress. HEENT: Normocephalic, atraumatic. Neck symmetric without masses. Sclera anicteric.  Chest: Normal work of breathing. Clear to auscultation bilaterally.   Cardiovascular: Regular rate and rhythm, no murmurs. Abdomen: Soft, nontender.  Normoactive bowel sounds.  No masses or hepatosplenomegaly appreciated.  Well-healed scar. Extremities: Grossly normal range of motion.  Warm, well perfused.  No edema bilaterally. Skin: No rashes or lesions noted. Lymphatics: No cervical, supraclavicular, or inguinal adenopathy. GU: Normal appearing external genitalia without erythema, excoriation, or lesions.  Speculum exam reveals normal vaginal cuff. Atrophic changes. Narrowing at vaginal apex.  Bimanual exam reveals smooth vaginal cuff. Exam chaperoned by Kimberly [place], CMA    Laboratory & Radiologic Studies: PET (outside) (06/18/23): EXAM:  NUCLEAR MEDICINE PET SKULL BASE TO THIGH   TECHNIQUE:  12.7 mCi F-18 FDG  was injected intravenously. Full-ring PET imaging  was performed from the skull base to thigh after the radiotracer. CT  data was obtained and used for attenuation correction and anatomic  localization.   Fasting blood glucose: 101 mg/dl   COMPARISON:  PET-CT 82/95/6213   FINDINGS:  Mediastinal blood pool activity: SUV max 2.07   Liver activity: SUV max NA   NECK: No hypermetabolic lymph nodes in the neck.   Incidental CT findings: None.   CHEST: No hypermetabolic mediastinal or hilar nodes. No suspicious  pulmonary nodules on the CT scan.   Incidental CT findings: None.   ABDOMEN/PELVIS: Patient is status post hysterectomy. No  hypermetabolic soft tissue mass or adenopathy in the pelvis to  suggest residual or recurrent disease. No hypermetabolic lesions  involving the liver, spleen pancreas, adrenal glands or kidneys. No  abdominal lymphadenopathy.   Incidental CT findings: Scattered vascular calcifications, advanced  for  age. No residual pelvic abscess.   SKELETON: No focal hypermetabolic activity to suggest skeletal  metastasis.   Incidental CT findings: None. Focus of hypermetabolism along left  labia, likely urine contamination.   IMPRESSION:  1. Status post hysterectomy. No findings to suggest residual or  recurrent disease in the pelvis.  2. No evidence of metastatic disease in the neck, chest, abdomen or  pelvis.  3. Scattered vascular calcifications, advanced for age.

## 2024-01-07 ENCOUNTER — Other Ambulatory Visit (HOSPITAL_COMMUNITY): Payer: Self-pay

## 2024-02-29 NOTE — Progress Notes (Incomplete)
 Radiation Oncology         [phone] ________________________________  Name: Sarah Morris MRN: [id number]  Date: 03/01/2024  DOB: January 30, 1981  Follow-Up Visit Note  CC: Suellyn Emory, NP  Suellyn Emory, NP  No diagnosis found.  Diagnosis: The encounter diagnosis was Cervical cancer, FIGO stage IB1 (HCC).   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 She was last seen here for follow-up on 09/01/23.    Since her last visit, she most recently met with Dr. Daisey Dryer on 12/08/23 for a routine surveillance visit. She was noted to be doing well and NED on examination at that time.   No other significant interval history since the patient was last seen for follow-up.   Of note: Dr. Daisey Dryer has scheduled her for a restaging PET scan this coming July.   ***                             Allergies:  is allergic to black pepper-turmeric, food, bacitracin-polymyxin b, and neosporin [neomycin-bacitracin zn-polymyx].  Meds: Current Outpatient Medications  Medication Sig Dispense Refill   conjugated estrogens (PREMARIN) vaginal cream Place 1 Applicatorful vaginally at bedtime for 14 days, THEN 1 Applicatorful 2 (two) times a week. 42.5 g 12   estradiol (ESTRACE) 1 MG tablet Take 1 tablet (1 mg total) by mouth daily. 30 tablet 5   No current facility-administered medications for this encounter.    Physical Findings: The patient is in no acute distress. Patient is  alert and oriented.  vitals were not taken for this visit. .  No significant changes. Lungs are clear to auscultation bilaterally. Heart has regular rate and rhythm. No palpable cervical, supraclavicular, or axillary adenopathy. Abdomen soft, non-tender, normal bowel sounds.  On pelvic examination the external genitalia were unremarkable. A speculum exam was performed. There are no mucosal lesions noted in the vaginal vault. A Pap smear was obtained of the proximal vagina. On bimanual and rectovaginal examination there were no pelvic masses appreciated. ***   Lab Findings: Lab Results  Component Value Date   WBC 2.1 (L) 12/25/2022   HGB 10.9 (L) 12/25/2022   HCT 31.5 (L) 12/25/2022   MCV 96.6 12/25/2022   PLT 102 (L) 12/25/2022    Radiographic Findings: No results found.  Impression:  The encounter diagnosis was Cervical cancer, FIGO stage IB1 (HCC).   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  ***  Plan:  ***   *** minutes of total time was spent for this patient encounter, including preparation, face-to-face counseling with the patient and coordination of care, physical exam, and documentation of the encounter. ____________________________________  Noralee Beam, PhD, MD  This document serves as a record of services personally performed by Retta Caster, MD. It was created  on his behalf by Aleta Anda, a trained medical scribe. The creation of this record is based on the scribe's personal observations and the provider's statements to them. This document has been checked and approved by the attending provider.

## 2024-03-01 ENCOUNTER — Inpatient Hospital Stay: Admission: RE | Admit: 2024-03-01 | Payer: Self-pay | Source: Ambulatory Visit | Admitting: Radiation Oncology

## 2024-03-09 NOTE — Progress Notes (Signed)
  Radiation Oncology         [phone] ________________________________  Name: Sarah Morris MRN: [id number]  Date: 03/10/2024  DOB: January 07, 1981  Follow-Up Visit Note  CC: Suellyn Emory, NP  Suellyn Emory, NP  No diagnosis found.  Diagnosis: The encounter diagnosis was Cervical cancer, FIGO stage IB1 (HCC).   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 She was last seen in office on 09/01/23 for a routine follow up.   In the interval since her last visit, she presented for a follow up with Dr. Daisey Dryer on 12/08/23 during which she reported feeling well overall and was noted NED on exam. At that time, she reported quitting smoking but continuing marijuana use and complained of hot flashes but that was managed with daily 1 mg estrogen.          No other significant oncologic interval history since the patient was last seen.                         Allergies:  is allergic to black pepper-turmeric, food, bacitracin-polymyxin b, and neosporin [neomycin-bacitracin zn-polymyx].  Meds: Current Outpatient Medications  Medication Sig Dispense Refill   conjugated estrogens  (PREMARIN ) vaginal cream Place 1 Applicatorful vaginally at bedtime for 14 days, THEN 1 Applicatorful 2 (two) times a week. 42.5 g 12   estradiol  (ESTRACE ) 1 MG tablet Take 1 tablet (1 mg total) by mouth daily. 30 tablet 5   No current facility-administered medications for this encounter.     Physical Findings: The patient is in no acute distress. Patient is alert and oriented.  vitals were not taken for this visit. .  No significant changes. Lungs are clear to auscultation bilaterally. Heart has regular rate and rhythm. No palpable cervical, supraclavicular, or axillary adenopathy. Abdomen soft, non-tender, normal bowel sounds.   Lab Findings: Lab Results  Component Value Date   WBC 2.1 (L) 12/25/2022   HGB 10.9 (L) 12/25/2022   HCT 31.5 (L) 12/25/2022   MCV 96.6 12/25/2022   PLT 102 (L) 12/25/2022    Radiographic Findings: No results found.  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  ***  Plan:  ***   *** minutes of total time was spent for this patient encounter, including preparation, face-to-face counseling with the patient and coordination of care, physical exam, and documentation of the encounter. ____________________________________  Noralee Beam, PhD, MD  This document serves as a record of services personally performed by Retta Caster, MD. It was created on his behalf by Lucky Sable, a trained medical scribe. The creation of this record is based on the scribe's personal observations and the provider's statements to them. This document has been checked and approved by the attending provider.

## 2024-03-10 ENCOUNTER — Encounter: Payer: Self-pay | Admitting: Radiation Oncology

## 2024-03-10 ENCOUNTER — Ambulatory Visit
Admission: RE | Admit: 2024-03-10 | Discharge: 2024-03-10 | Disposition: A | Discharge status: Home / Self Care | Payer: MEDICAID | Source: Ambulatory Visit | Attending: Radiation Oncology | Admitting: Radiation Oncology

## 2024-03-10 ENCOUNTER — Telehealth: Payer: Self-pay | Admitting: Oncology

## 2024-03-10 ENCOUNTER — Ambulatory Visit: Payer: MEDICAID | Attending: Gynecologic Oncology

## 2024-03-10 VITALS — BP 114/88 | HR 97 | Temp 97.2°F | Resp 18 | Ht 62.0 in | Wt 109.4 lb

## 2024-03-10 DIAGNOSIS — Z9221 Personal history of antineoplastic chemotherapy: Secondary | ICD-10-CM | POA: Insufficient documentation

## 2024-03-10 DIAGNOSIS — Z923 Personal history of irradiation: Secondary | ICD-10-CM | POA: Insufficient documentation

## 2024-03-10 DIAGNOSIS — C530 Malignant neoplasm of endocervix: Secondary | ICD-10-CM

## 2024-03-10 DIAGNOSIS — C539 Malignant neoplasm of cervix uteri, unspecified: Secondary | ICD-10-CM

## 2024-03-10 DIAGNOSIS — Z8541 Personal history of malignant neoplasm of cervix uteri: Secondary | ICD-10-CM | POA: Diagnosis present

## 2024-03-10 MED ORDER — SILVER SULFADIAZINE 1 % EX CREA: TOPICAL_CREAM | Freq: Every day | CUTANEOUS | Status: DC

## 2024-03-10 NOTE — Progress Notes (Signed)
 Sarah Morris is here today for follow up post radiation to the pelvic.  They completed their radiation on: 01/10/23   Does the patient complain of any of the following:  Pain: No Abdominal bloating: Yes after eating.  Diarrhea/Constipation: No Nausea/Vomiting: No Vaginal Discharge: No Blood in Urine or Stool: no Urinary Issues (dysuria/incomplete emptying/ incontinence/ increased frequency/urgency): Yes, reports pressure when she urinates.  Does patient report using vaginal dilator 2-3 times a week and/or sexually active 2-3 weeks: Yes, sexually active and using dilators.  Post radiation skin changes:  Yes,continues to have small skin boils to vaginal folds.    Additional comments if applicable:  Patient reports having tingling to hands and feet. Patient reports vaginal dryness.   BP 114/88 (BP Location: Left Arm, Patient Position: Sitting, Cuff Size: Normal)   Pulse 97   Temp (!) 97.2 F (36.2 C)   Resp 18   Ht 5\' 2"  (1.575 m)   Wt 109 lb 6.4 oz (49.6 kg)   LMP 08/27/2022 Comment: Spotted four days, 08-27-22 to 08-31-22;09-10-22 UA preg test negative  SpO2 100%   BMI 20.01 kg/m

## 2024-03-10 NOTE — Telephone Encounter (Signed)
 Called Sarah Morris regarding the pain she is having in her fingertips and feet.  She said it comes and goes but wants to find out why it is happening and if there is anything that can help relieve it.  Asked if she would like to be referred to Dr. Mark Sil per Dr. Marton Sleeper and she would like the referral. Advised her a scheduler will call her with the appointment.

## 2024-03-16 LAB — CYTOLOGY - PAP
Comment: NEGATIVE
Diagnosis: NEGATIVE
Diagnosis: REACTIVE
High risk HPV: NEGATIVE

## 2024-03-17 ENCOUNTER — Telehealth: Payer: Self-pay

## 2024-03-17 NOTE — Telephone Encounter (Signed)
 Called to make patient aware of negative pap smear results. Patient voiced understanding.

## 2024-03-26 ENCOUNTER — Ambulatory Visit: Admitting: Hematology and Oncology

## 2024-03-29 ENCOUNTER — Inpatient Hospital Stay: Payer: MEDICAID | Attending: Internal Medicine | Admitting: Internal Medicine

## 2024-05-19 ENCOUNTER — Ambulatory Visit (HOSPITAL_COMMUNITY)
Admission: EM | Admit: 2024-05-19 | Discharge: 2024-05-19 | Disposition: A | Discharge status: Home / Self Care | Attending: Family Medicine | Admitting: Family Medicine

## 2024-05-19 ENCOUNTER — Other Ambulatory Visit: Payer: Self-pay

## 2024-05-19 ENCOUNTER — Encounter (HOSPITAL_COMMUNITY): Payer: Self-pay | Admitting: Emergency Medicine

## 2024-05-19 ENCOUNTER — Ambulatory Visit (HOSPITAL_COMMUNITY): Payer: Self-pay

## 2024-05-19 DIAGNOSIS — H5789 Other specified disorders of eye and adnexa: Secondary | ICD-10-CM | POA: Diagnosis not present

## 2024-05-19 DIAGNOSIS — H109 Unspecified conjunctivitis: Secondary | ICD-10-CM | POA: Diagnosis not present

## 2024-05-19 MED ORDER — OLOPATADINE HCL 0.2 % OP SOLN: 1.0000 [drp] | Freq: Every day | OPHTHALMIC | 0 refills | Status: AC

## 2024-05-19 MED ORDER — SULFACETAMIDE SODIUM 10 % OP SOLN: 1.0000 [drp] | OPHTHALMIC | 0 refills | Status: AC

## 2024-05-19 NOTE — ED Triage Notes (Signed)
 Right eye red, swollen.  Felt like something in right eye, flushed with saline.  Patient does not wear contacts

## 2024-05-19 NOTE — ED Provider Notes (Signed)
 [facility]   [id number] 05/19/24 Arrival Time: 1142  ASSESSMENT & PLAN:  1. Eye irritation   2. Conjunctivitis of right eye, unspecified conjunctivitis type    Suspect conjunctivitis; likely allergic. Will cover for bacterial. Discussed. Denies specific eye pain/visual changes/light sensitivity.  Meds ordered this encounter  Medications   Olopatadine HCl 0.2 % SOLN    Sig: Apply 1 drop to eye daily.    Dispense:  2.5 mL    Refill:  0   sulfacetamide (BLEPH-10) 10 % ophthalmic solution    Sig: Place 1 drop into the right eye every 3 (three) hours while awake. For up to one week.    Dispense:  15 mL    Refill:  0   Reviewed expectations re: course of current medical issues. Questions answered. Outlined signs and symptoms indicating need for more acute intervention. Patient verbalized understanding. After Visit Summary given.   SUBJECTIVE:  Sarah Morris is a 43 y.o. female who presents with complaint of R eye irritation; gradual onset; x 3-4 days; watery drainage. Denies visual changes and light sensitivity. Denies eye trauma. Does not wear contact lenses. No tx PTA.  OBJECTIVE:  Vitals:   05/19/24 1225  BP: (!) 117/95  Pulse: 79  Resp: 18  Temp: 99 F (37.2 C)  TempSrc: Oral  SpO2: 99%    General appearance: alert; no distress HEENT: [place]; AT; PERRLA; no restriction of the extraocular movements OS: normal OD: without reported pain; with mild conjunctival injection; with watery drainage; without gross corneal opacities; without limbal flush; without periorbital swelling or erythema (Wood's lamp unavailable at time of exam but I very much doubt any corneal abrasion) Neck: supple without LAD Lungs: clear to auscultation bilaterally; unlabored respirations Heart: regular rate and rhythm Skin: warm and dry Psychological: alert and cooperative; normal mood and affect    Allergies  Allergen Reactions   Black Pepper-Turmeric Anaphylaxis   Food Swelling     Black pepper   Bacitracin-Polymyxin B Rash   Neosporin [Neomycin-Bacitracin Zn-Polymyx] Rash    Past Medical History:  Diagnosis Date   Alcoholism (HCC)    Cervical cancer (HCC)    Ectopic pregnancy    multiple   History of radiation therapy    Cervix- 11/25/22-01/10/23-Dr. Lynwood Nasuti   Social History   Socioeconomic History   Marital status: Single    Spouse name: Not on file   Number of children: Not on file   Years of education: Not on file   Highest education level: Not on file  Occupational History   Occupation: handler  Tobacco Use   Smoking status: Every Day    Current packs/day: 0.50    Average packs/day: 0.5 packs/day for 17.5 years (8.7 ttl pk-yrs)    Types: Cigarettes    Start date: 2008   Smokeless tobacco: Never  Vaping Use   Vaping status: Some Days  Substance and Sexual Activity   Alcohol use: Yes    Alcohol/week: 25.0 standard drinks of alcohol    Types: 25 Cans of beer per week    Comment: occassionally   Drug use: Yes    Frequency: 7.0 times per week    Types: Marijuana   Sexual activity: Yes  Other Topics Concern   Not on file  Social History Narrative   Not on file   Social Drivers of Health   Financial Resource Strain: High Risk (08/19/2022)   Overall Financial Resource Strain (CARDIA)    Difficulty of Paying Living Expenses: Hard  Food Insecurity: Food Insecurity Present (09/12/2022)   Hunger Vital Sign    Worried About Running Out of Food in the Last Year: Often true    Ran Out of Food in the Last Year: Sometimes true  Transportation Needs: No Transportation Needs (08/19/2022)   PRAPARE - Administrator, Civil Service (Medical): No    Lack of Transportation (Non-Medical): No  Physical Activity: Not on file  Stress: Not on file  Social Connections: Not on file  Intimate Partner Violence: Not on file   Family History  Problem Relation Age of Onset   Hypertension Mother    Diabetes Maternal Grandmother    Cancer  Maternal Grandfather    Lung cancer Maternal Grandfather    Cancer Paternal Grandfather    Diabetes Cousin    Stomach cancer Neg Hx    Esophageal cancer Neg Hx    Colon cancer Neg Hx    Past Surgical History:  Procedure Laterality Date   ABDOMINAL HYSTERECTOMY     BILATERAL SALPINGECTOMY N/A 09/10/2022   Procedure: OPEN BILATERAL SALPINGECTOMY;  Surgeon: Eldonna Mays, MD;  Location: [facility];  Service: Gynecology;  Laterality: N/A;   DILATION AND CURETTAGE OF UTERUS     IR IMAGING GUIDED PORT INSERTION  11/20/2022   IR REMOVAL TUN ACCESS W/ PORT W/O FL MOD SED  01/08/2023   LYMPH NODE DISSECTION N/A 09/10/2022   Procedure: PELVIC LYMPH NODE DISSECTION;  Surgeon: Eldonna Mays, MD;  Location: [facility];  Service: Gynecology;  Laterality: N/A;   RADICAL HYSTERECTOMY N/A 09/10/2022   Procedure: OPEN RADICAL HYSTERECTOMY;  Surgeon: Eldonna Mays, MD;  Location: [facility];  Service: Gynecology;  Laterality: N/AMERL Rolinda Rogue, MD 05/19/24 1407

## 2024-06-06 ENCOUNTER — Encounter: Payer: Self-pay | Admitting: Gynecologic Oncology

## 2024-06-07 ENCOUNTER — Telehealth: Payer: Self-pay | Admitting: *Deleted

## 2024-06-07 ENCOUNTER — Inpatient Hospital Stay (HOSPITAL_COMMUNITY): Admission: RE | Admit: 2024-06-07 | Payer: BLUE CROSS/BLUE SHIELD | Source: Ambulatory Visit

## 2024-06-07 NOTE — Telephone Encounter (Signed)
 Spoke with Sarah Morris and her follow up appointment with Dr. Eldonna has been rescheduled for Monday, August 4th at 3:15 pm per patient's request. Advised patient the office will reach back out once we hear back from Dr. Eldonna in regards to CT scan since insurance denied Pet Scan. Pt verbalized understanding and thanked the office for calling.

## 2024-06-09 ENCOUNTER — Other Ambulatory Visit: Payer: Self-pay | Admitting: Gynecologic Oncology

## 2024-06-09 ENCOUNTER — Telehealth: Payer: Self-pay | Admitting: *Deleted

## 2024-06-09 DIAGNOSIS — C539 Malignant neoplasm of cervix uteri, unspecified: Secondary | ICD-10-CM

## 2024-06-09 NOTE — Telephone Encounter (Signed)
 Spoke with Sarah Morris and relayed message from Sarah Epps, NP that patient's insurance has denied covering the PET scan stating patient would need to have other imaging first such as a CT scan. Per Dr. Eldonna, CT chest, abdomen and pelvis ordered. Pt verbalized understanding.   CT scan scheduled for next week, Thursday per patient due to her being out of town until after Monday, 7/28. CT at [facility] 7/31 at 4:30 with arrival time of 2:30 for oral contrast.

## 2024-06-09 NOTE — Progress Notes (Signed)
 Insurance denied PET scan stating they needed more up to date imaging such as an MRI or CT. Per Dr. Eldonna, move forward with CT CAP.

## 2024-06-09 NOTE — Telephone Encounter (Signed)
-----   Message from Sarah Morris sent at 06/09/2024  2:31 PM EDT ----- Please let the patient know her insurance has denied covering the PET scan stating she would need to have other imaging first such as a CT scan. Per Dr. Eldonna, CT chest, abdomen and pelvis ordered. Please schedule this for now

## 2024-06-14 ENCOUNTER — Inpatient Hospital Stay: Payer: MEDICAID | Admitting: Psychiatry

## 2024-06-17 ENCOUNTER — Ambulatory Visit (HOSPITAL_COMMUNITY): Payer: MEDICAID

## 2024-06-17 ENCOUNTER — Encounter (HOSPITAL_COMMUNITY): Payer: Self-pay

## 2024-06-18 ENCOUNTER — Telehealth: Payer: Self-pay

## 2024-06-18 NOTE — Telephone Encounter (Signed)
 Scheduled CT scan on 7/31,ordered by Dr.Newton,cancelled by radiology department d/t scan not authorized by pt's insurance.   Follow up appointment with Dr.Newton on Monday 8/4 rescheduled to 8/18 d/t pt has not had scan.   [place] in authorization department working on authorization for scan through [place].

## 2024-06-21 ENCOUNTER — Telehealth: Payer: Self-pay

## 2024-06-21 ENCOUNTER — Inpatient Hospital Stay: Payer: MEDICAID | Admitting: Psychiatry

## 2024-06-21 DIAGNOSIS — C539 Malignant neoplasm of cervix uteri, unspecified: Secondary | ICD-10-CM

## 2024-06-21 NOTE — Telephone Encounter (Signed)
 Spoke with patient and gave her number to schedule her CT scan.SABRA

## 2024-06-21 NOTE — Telephone Encounter (Signed)
 LVM for Ms.Corker to call back regarding recent CT scan ordered by Dr. Eldonna.   Per [place], authorization department, scan has been approved 06/09/24-07/09/24

## 2024-06-22 NOTE — Progress Notes (Signed)
 This encounter was created in error - please disregard.

## 2024-06-24 ENCOUNTER — Ambulatory Visit (HOSPITAL_COMMUNITY)
Admission: RE | Admit: 2024-06-24 | Discharge: 2024-06-24 | Disposition: A | Discharge status: Home / Self Care | Source: Ambulatory Visit | Attending: Gynecologic Oncology | Admitting: Gynecologic Oncology

## 2024-06-24 DIAGNOSIS — C539 Malignant neoplasm of cervix uteri, unspecified: Secondary | ICD-10-CM | POA: Diagnosis not present

## 2024-06-24 MED ORDER — IOHEXOL 300 MG/ML  SOLN
80.0000 mL | Freq: Once | INTRAMUSCULAR | Status: AC | PRN
  Administered 2024-06-24: 100 mL via INTRAVENOUS

## 2024-06-24 MED ORDER — IOHEXOL 300 MG/ML  SOLN
30.0000 mL | Freq: Once | INTRAMUSCULAR | Status: AC | PRN
  Administered 2024-06-24: 30 mL via ORAL

## 2024-06-25 ENCOUNTER — Telehealth: Payer: Self-pay | Admitting: *Deleted

## 2024-06-25 ENCOUNTER — Encounter: Payer: Self-pay | Admitting: Psychiatry

## 2024-06-25 NOTE — Telephone Encounter (Signed)
 Spoke with [name] who called the office asking if provider could re-send a Rx for Metronidazole  for bacterial vaginosis?  Patient states she never picked up the Rx. Pt last saw Dr. Eldonna in January with no notation of vaginal discharge or Rx?  Patient states she started having a vaginal discharge about a week ago and states it started out itchy at first and she denies itching now, but states the discharge is clear to  white mucous. Denies fishy or foul odor. Pt also denies vaginal bleeding, fever, chills and or pain.  Pt reports taking an over the counter monistat  suppository but this didn't help with the discharge.   Patient states she changed her body soap that she had been using and denies douching and no sexual activity at this time. Pt is currently still using her estrogen cream two times a week.   Advised patient that providers are out of the office until next week. Pt had follow up with Dr. Shannon on 03/10/24. Pt has a follow up with Dr. Eldonna on 8/18. Pt advised that her message will be forwarded to Dr. Eldonna and also advised patient to call Dr. Colton office today as well. Pt verbalized understanding and thanked the office.

## 2024-07-01 ENCOUNTER — Encounter: Payer: Self-pay | Admitting: Psychiatry

## 2024-07-05 ENCOUNTER — Encounter: Payer: Self-pay | Admitting: Psychiatry

## 2024-07-05 ENCOUNTER — Inpatient Hospital Stay: Payer: Self-pay | Attending: Internal Medicine | Admitting: Psychiatry

## 2024-07-05 VITALS — BP 109/70 | HR 82 | Temp 98.1°F | Resp 18 | Wt 107.4 lb

## 2024-07-05 DIAGNOSIS — Z923 Personal history of irradiation: Secondary | ICD-10-CM | POA: Insufficient documentation

## 2024-07-05 DIAGNOSIS — N952 Postmenopausal atrophic vaginitis: Secondary | ICD-10-CM

## 2024-07-05 DIAGNOSIS — E8941 Symptomatic postprocedural ovarian failure: Secondary | ICD-10-CM | POA: Insufficient documentation

## 2024-07-05 DIAGNOSIS — R232 Flushing: Secondary | ICD-10-CM

## 2024-07-05 DIAGNOSIS — Z90722 Acquired absence of ovaries, bilateral: Secondary | ICD-10-CM | POA: Insufficient documentation

## 2024-07-05 DIAGNOSIS — Z8541 Personal history of malignant neoplasm of cervix uteri: Secondary | ICD-10-CM | POA: Diagnosis present

## 2024-07-05 DIAGNOSIS — C539 Malignant neoplasm of cervix uteri, unspecified: Secondary | ICD-10-CM | POA: Diagnosis not present

## 2024-07-05 DIAGNOSIS — Z9079 Acquired absence of other genital organ(s): Secondary | ICD-10-CM | POA: Insufficient documentation

## 2024-07-05 DIAGNOSIS — Z79890 Hormone replacement therapy (postmenopausal): Secondary | ICD-10-CM | POA: Insufficient documentation

## 2024-07-05 DIAGNOSIS — Z90710 Acquired absence of both cervix and uterus: Secondary | ICD-10-CM | POA: Insufficient documentation

## 2024-07-05 DIAGNOSIS — B3731 Acute candidiasis of vulva and vagina: Secondary | ICD-10-CM | POA: Insufficient documentation

## 2024-07-05 MED ORDER — FLUCONAZOLE 150 MG PO TABS: 150.0000 mg | ORAL_TABLET | Freq: Once | ORAL | 0 refills | Status: AC

## 2024-07-05 NOTE — Patient Instructions (Addendum)
 It was a pleasure to see you in clinic today. - Normal exam today - CT scan in 6 months - Recommend that you try pepcid (over the counter) for your stomach - One pill of diflucan  sent to the pharmacy for vaginal yeast - Return visit planned for 79mo  Thank you very much for allowing me to provide care for you today.  I appreciate your confidence in choosing our Gynecologic Oncology team at [facility].  If you have any questions about your visit today please call our office or send us  a MyChart message and we will get back to you as soon as possible.

## 2024-07-05 NOTE — Progress Notes (Signed)
 Gynecologic Oncology Return Clinic Visit  Date of Service: 07/05/2024 Referring Provider: Rexene Hoit, MD   Assessment & Plan: Sarah Morris is a 43 y.o. 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 She presents today for surveillance.  IB2 SCC of the cervix: - completed adjuvant chemoRT 12/2022. - NED on exam today. - Signs/symptoms of recurrence reviewed. - recommend annual pap. Last collected 03/10/34 NILM, HPV HR neg. - Continue surveillance with follow-up q3 months initially. Can alternate with Dr. Shannon. Sees Dr. Shannon 09/09/24. - Post treatment PET 05/2023 NED. Repeat imaging CT c/a/p on 06/24/24 NED overall with 2 small right apical groundglass nodules that are most likely inflammatory/infectious (recommend re-eval at next routine CT follow-up). Will repeat PET in 24mo. If negative will space to yearly.   Vaginal dryness: - Improved with vaginal estrogen. Continue. May be able to decrease following initiation of oral estrogen per below.  Hotflashes: - PO estrogen 1mg  daily rx sent in January - Pt reports that her pharmacy did not give her. Recommend she follow-up with pharmacy and if any issues let us  know so we can try sending in a new prescription  Vaginal yeast: - Swab collected and sent - Given thick white discharge and itching, one dose of diflucan  sent to pharmacy   RTC Rad onc 655mo, Gyn onc 24mo   Hoy Masters, MD Gynecologic Oncology   Medical Decision Making I personally spent  TOTAL 25 minutes face-to-face and non-face-to-face in the care of this patient, which includes all pre, intra, and post visit time on the date of service.   ----------------------- Reason for Visit: Surveillance  Treatment  History: Oncology History Overview Note  CPS score 8% for PD-L1   Cervical cancer, FIGO stage IB1 (HCC)  05/07/2022 Imaging   IMPRESSION: 1. No acute process identified. 2. Hepatic hemangioma and a subcentimeter hypodensity which may represent cyst or hemangioma. 3. Fibroid uterus.   06/10/2022 Procedure   Pap: HSIL, HPV 16+   07/24/2022 Initial Biopsy      08/23/2022 Imaging   PET:  IMPRESSION: 1. Marked ill-defined cervical FDG avidity consistent with primary cervical neoplasm. 2. No hypermetabolic abdominopelvic adenopathy or convincing evidence of hypermetabolic distant metastatic disease. 3. Very small volume pelvic free fluid does not demonstrate significant abnormal FDG avidity and there is no discrete hypermetabolic peritoneal nodularity, volume is within physiologic normal limits but is new from prior imaging. Suggest attention on follow-up imaging. 4. Scattered nodular areas of hypermetabolic uterine activity most notably involving a nodular focus in the uterine fundus with a max SUV of 4.33, commonly reflecting uterine fibroids. 5. Aortic Atherosclerosis (ICD10-I70.0) and Emphysema (ICD10-J43.9).   09/10/2022 Surgery   Radical abdominal hysterectomy with upper vaginectomy (2cm), bilateral salpingectomy, bilateral pelvic lymphadenectomy (Class C1 radical hysterectomy)    09/10/2022 Cancer Staging   Staging form: Cervix Uteri, AJCC Version 9 - Pathologic stage from 09/10/2022: FIGO Stage IB2 (pT1b2, pN0, cM0) - Signed by Masters Hoy, MD on 09/23/2022 Histopathologic type: Squamous cell carcinoma, NOS Stage prefix: Initial diagnosis Histologic grade (G): G3 Histologic grading system: 3 grade system Residual tumor (R): R0 - None Lymph-vascular invasion (LVI): BOTH lymphatic and small vessel AND venous (large vessel) invasion    Pathology Results   FINAL MICROSCOPIC DIAGNOSIS: A. LYMPH NODES, RIGHT PELVIC, RESECTION: - Five lymph nodes  with one showing evidence  of endosalpingosis, negative for carcinoma (0/5) B. LYMPH NODES, LEFT PELVIC, RESECTION: - Four lymph nodes with some showing evidence of endosalpingosis, negative for carcinoma (0/4) C. ADHERENT OMENTUM, EXCISION: - Portion of omentum, negative for carcinoma D. 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 The catheter tip lies at the cavo-atrial junction. A power injectable port a cath was placed and is ready for immediate use   11/22/2022 - 12/20/2022 Chemotherapy   Patient is on Treatment Plan : Cervical Cisplatin  (40) q7d     01/09/2023 Procedure   Technically successful tunneled Port catheter removal.      Interval History: Patient reports that she is overall doing well.  Does note some bloating sensation in her upper abdomen that comes and goes.  She sometimes takes Tums which seems to help.  Does feel some acid reflux.  Also reports that she was having vaginal itching and discharge.  She took 3-day Monistat  with improvement in her symptoms.  But she is still concerned that she may have some residual today.  Also reports some pressure with urination but no burning.  This has been going on for some time.  Having regular bowel movements.  Reports having a cold about a month ago for which she used Mucinex.  Also continues with daily marijuana  smoking.  Denies weight loss, vaginal bleeding.  Using her vaginal estrogen 3 times a week and the vaginal dilators.  Reports that she was not given the oral estrogen from the pharmacy.  Having occasional daytime hot flashes but mostly night sweats.   Past Medical/Surgical History: Past Medical History:  Diagnosis Date   Alcoholism (HCC)    Cervical cancer (HCC)    Ectopic pregnancy    multiple   History of radiation therapy    Cervix- 11/25/22-01/10/23-Dr. Lynwood Nasuti    Past Surgical History:  Procedure Laterality Date   ABDOMINAL HYSTERECTOMY     BILATERAL SALPINGECTOMY N/A 09/10/2022   Procedure: OPEN BILATERAL SALPINGECTOMY;  Surgeon: Eldonna Mays, MD;  Location: [facility];  Service: Gynecology;  Laterality: N/A;   DILATION AND CURETTAGE OF UTERUS     IR IMAGING GUIDED PORT INSERTION  11/20/2022   IR REMOVAL TUN ACCESS W/ PORT W/O FL MOD SED  01/08/2023   LYMPH NODE DISSECTION N/A 09/10/2022   Procedure: PELVIC LYMPH NODE DISSECTION;  Surgeon: Eldonna Mays, MD;  Location: [facility];  Service: Gynecology;  Laterality: N/A;   RADICAL HYSTERECTOMY N/A 09/10/2022   Procedure: OPEN RADICAL HYSTERECTOMY;  Surgeon: Eldonna Mays, MD;  Location: [facility];  Service: Gynecology;  Laterality: N/A;    Family History  Problem Relation Age of Onset   Hypertension Mother    Diabetes Maternal Grandmother    Cancer Maternal Grandfather    Lung cancer Maternal Grandfather    Cancer Paternal Grandfather    Diabetes Cousin  Stomach cancer Neg Hx    Esophageal cancer Neg Hx    Colon cancer Neg Hx     Social History   Socioeconomic History   Marital status: Single    Spouse name: Not on file   Number of children: Not on file   Years of education: Not on file   Highest education level: Not on file  Occupational History   Occupation: handler  Tobacco Use   Smoking status: Every Day    Current packs/day: 0.50    Average packs/day: 0.5 packs/day for 17.6 years (8.8 ttl pk-yrs)     Types: Cigarettes    Start date: 2008   Smokeless tobacco: Never  Vaping Use   Vaping status: Some Days  Substance and Sexual Activity   Alcohol use: Yes    Alcohol/week: 25.0 standard drinks of alcohol    Types: 25 Cans of beer per week    Comment: occassionally   Drug use: Yes    Frequency: 7.0 times per week    Types: Marijuana   Sexual activity: Yes  Other Topics Concern   Not on file  Social History Narrative   Not on file   Social Drivers of Health   Financial Resource Strain: High Risk (08/19/2022)   Overall Financial Resource Strain (CARDIA)    Difficulty of Paying Living Expenses: Hard  Food Insecurity: Food Insecurity Present (09/12/2022)   Hunger Vital Sign    Worried About Running Out of Food in the Last Year: Often true    Ran Out of Food in the Last Year: Sometimes true  Transportation Needs: No Transportation Needs (08/19/2022)   PRAPARE - Administrator, Civil Service (Medical): No    Lack of Transportation (Non-Medical): No  Physical Activity: Not on file  Stress: Not on file  Social Connections: Not on file    Current Medications:  Current Outpatient Medications:    conjugated estrogens  (PREMARIN ) vaginal cream, Place 1 Applicatorful vaginally at bedtime for 14 days, THEN 1 Applicatorful 2 (two) times a week., Disp: 42.5 g, Rfl: 12   estradiol  (ESTRACE ) 1 MG tablet, Take 1 tablet (1 mg total) by mouth daily., Disp: 30 tablet, Rfl: 5   fluconazole  (DIFLUCAN ) 150 MG tablet, Take 1 tablet (150 mg total) by mouth once for 1 dose., Disp: 1 tablet, Rfl: 0   Olopatadine  HCl 0.2 % SOLN, Apply 1 drop to eye daily., Disp: 2.5 mL, Rfl: 0   sulfacetamide  (BLEPH -10) 10 % ophthalmic solution, Place 1 drop into the right eye every 3 (three) hours while awake. For up to one week., Disp: 15 mL, Rfl: 0  Review of Symptoms: Complete 10-system review is negative except as above in Interval History.  Physical Exam: BP 109/70 (BP Location: Left Arm, Patient  Position: Sitting)   Pulse 82   Temp 98.1 F (36.7 C) (Oral)   Resp 18   Wt 107 lb 6.4 oz (48.7 kg)   LMP 08/27/2022 Comment: Spotted four days, 08-27-22 to 08-31-22;09-10-22 UA preg test negative  SpO2 100%   BMI 19.64 kg/m  General: Alert, oriented, no acute distress. HEENT: Normocephalic, atraumatic. Neck symmetric without masses. Sclera anicteric.  Chest: Normal work of breathing. Clear to auscultation bilaterally.   Cardiovascular: Regular rate and rhythm, no murmurs. Abdomen: Soft, nontender.  Normoactive bowel sounds.  No masses or hepatosplenomegaly appreciated.  Well-healed scar. Extremities: Grossly normal range of motion.  Warm, well perfused.  No edema bilaterally. Skin: No rashes or lesions noted. Lymphatics: No cervical, supraclavicular,  or inguinal adenopathy. GU: Normal appearing external genitalia without erythema, excoriation, or lesions.  Speculum exam reveals normal vaginal cuff. Thick white vaginal discharge present.  Narrowing at vaginal apex.  Bimanual exam reveals smooth vaginal cuff. Exam chaperoned by Kimberly [place], CMA    Laboratory & Radiologic Studies: CT CHEST ABDOMEN PELVIS W CONTRAST 06/24/2024  Narrative EXAM:  CT CHEST ABDOMEN PELVIS WITH IV CONTRAST  INDICATION: Metastatic disease evaluation; per Dr. 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  COMPARISON: 11/06/2022 CT pelvis, 06/18/2023 PET/CT, 05/06/2022 CT abdomen/pelvis  FINDINGS: The cardiac size is within normal limits. There is no thoracic aortic aneurysm. No filling defects are identified in the central pulmonary arteries. The esophagus and thyroid glands have a normal appearance. There is no mass or adenopathy in the chest. No pleural or pericardial effusion is present. 6  mm and 3 mm right apical groundglass nodules are visualized on image 26-4. These are not visualized previously. The lungs are otherwise clear.  The 2.1 cm right hepatic mass remains stable, consistent with the history of hemangioma. No developing hepatic masses are identified.  There is no significant abnormality identified in the spleen, pancreas and gallbladder. No calculus, obstruction, or soft tissue mass is present involving the kidneys. There are no adrenal masses. The abdominal bowel loops are unremarkable. There is no evidence of ascites or adenopathy. No abdominal aortic aneurysm is present.  The uterus is surgically absent. The bladder has a normal appearance. There is no recurrent pelvic mass. There is no inguinal hernia. The pelvic bowel loops have a normal appearance.  There is no fracture or bone destruction.  IMPRESSION: 1. Development of 2 small right apical groundglass nodules. These are most likely infectious or inflammatory. These should be reevaluated at the time of the patient's next routine follow-up CT. 2. No evidence of tumor recurrence or metastatic disease in the abdomen and pelvis.   Please note that CT scanning at this site utilizes multiple dose reduction techniques, including automatic exposure control, adjustment of the MAA and/or KVP according to the patient's size, and use of iterative reconstruction.  Electronically signed by: Eddy Oar MD 06/30/2024 11:42 AM EDT RP Workstation: [id number]

## 2024-07-06 ENCOUNTER — Ambulatory Visit: Payer: Self-pay | Admitting: Psychiatry

## 2024-07-06 LAB — CERVICOVAGINAL ANCILLARY ONLY
Bacterial Vaginitis (gardnerella): NEGATIVE
Candida Glabrata: NEGATIVE
Candida Vaginitis: NEGATIVE
Comment: NEGATIVE
Comment: NEGATIVE
Comment: NEGATIVE

## 2024-09-08 NOTE — Progress Notes (Shared)
 Radiation Oncology         [phone] ________________________________  Name: Sarah Morris MRN: [id number]  Date: 09/09/2024  DOB: 1981-08-13  Follow-Up Visit Note  CC: Micheline Eleanor BIRCH, NP  Micheline Eleanor BIRCH, NP  No diagnosis found.  Diagnosis:The encounter diagnosis was Cervical cancer, FIGO stage IB1 (HCC).   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 She was last seen in office on 03/10/24 for a follow up visit. Patient continued to follow up with their specialists to manage their chronic conditions.   She underwent a CT a/p on 06/24/24 showing development of 2 small right apical groundglass nodules which are most likely infectious or inflammatory. otherwise, no evidence of tumor recurrence or metastatic disease in the abdomen andpelvis.   In the interval since she was last seen, she presented for a follow up visit with Dr. Eldonna on 07/05/24 during which she was noted NED on exam with no symptoms or concerns indication disease recurrence.                    No other significant oncologic interval history since the patient was last seen.   Of note: she was seen in the UC on 05/19/24 for an eye irritation.                               Allergies:  is allergic to black pepper-turmeric, food, bacitracin-polymyxin b, and neosporin [neomycin-bacitracin zn-polymyx].  Meds: Current Outpatient Medications  Medication Sig Dispense Refill   conjugated estrogens  (PREMARIN ) vaginal  cream Place 1 Applicatorful vaginally at bedtime for 14 days, THEN 1 Applicatorful 2 (two) times a week. 42.5 g 12   estradiol  (ESTRACE ) 1 MG tablet Take 1 tablet (1 mg total) by mouth daily. 30 tablet 5   Olopatadine  HCl 0.2 % SOLN Apply 1 drop to eye daily. 2.5 mL 0   sulfacetamide  (BLEPH -10) 10 % ophthalmic solution Place 1 drop into the right eye every 3 (three) hours while awake. For up to one week. 15 mL 0   No current facility-administered medications for this visit.    Physical Findings: The patient is in no acute distress. Patient is alert and oriented.  vitals were not taken for this visit. .  No significant changes. Lungs are clear to auscultation bilaterally. Heart has regular rate and rhythm. No palpable cervical, supraclavicular, or axillary adenopathy. Abdomen soft, non-tender, normal bowel sounds.   Lab Findings: Lab Results  Component Value Date   WBC 2.1 (L) 12/25/2022   HGB 10.9 (L) 12/25/2022   HCT 31.5 (L) 12/25/2022   MCV 96.6 12/25/2022   PLT 102 (L) 12/25/2022    Radiographic Findings: No results found.  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  ***  Plan:  ***   ***  minutes of total time was spent for this patient encounter, including preparation, face-to-face counseling with the patient and coordination of care, physical exam, and documentation of the encounter. ____________________________________  Lynwood CHARM Nasuti, PhD, MD  This document serves as a record of services personally performed by Lynwood Nasuti, MD. It was created on his behalf by Reymundo Cartwright, a trained medical scribe. The creation of this record is based on the scribe's personal observations and the provider's statements to them. This document has been  checked and approved by the attending provider.

## 2024-09-09 ENCOUNTER — Encounter: Payer: Self-pay | Admitting: Radiation Oncology

## 2024-09-09 ENCOUNTER — Ambulatory Visit
Admission: RE | Admit: 2024-09-09 | Discharge: 2024-09-09 | Disposition: A | Discharge status: Home / Self Care | Payer: MEDICAID | Source: Ambulatory Visit | Attending: Radiation Oncology | Admitting: Radiation Oncology

## 2024-09-09 VITALS — BP 105/89 | HR 92 | Temp 97.3°F | Resp 18 | Ht 62.0 in | Wt 110.0 lb

## 2024-09-09 DIAGNOSIS — Z923 Personal history of irradiation: Secondary | ICD-10-CM | POA: Insufficient documentation

## 2024-09-09 DIAGNOSIS — Z90710 Acquired absence of both cervix and uterus: Secondary | ICD-10-CM | POA: Diagnosis not present

## 2024-09-09 DIAGNOSIS — L659 Nonscarring hair loss, unspecified: Secondary | ICD-10-CM

## 2024-09-09 DIAGNOSIS — Z9221 Personal history of antineoplastic chemotherapy: Secondary | ICD-10-CM | POA: Diagnosis not present

## 2024-09-09 DIAGNOSIS — C539 Malignant neoplasm of cervix uteri, unspecified: Secondary | ICD-10-CM

## 2024-09-09 DIAGNOSIS — Z79899 Other long term (current) drug therapy: Secondary | ICD-10-CM | POA: Insufficient documentation

## 2024-09-09 DIAGNOSIS — Z8541 Personal history of malignant neoplasm of cervix uteri: Secondary | ICD-10-CM | POA: Diagnosis not present

## 2024-09-09 DIAGNOSIS — R21 Rash and other nonspecific skin eruption: Secondary | ICD-10-CM | POA: Insufficient documentation

## 2024-09-09 DIAGNOSIS — Z90722 Acquired absence of ovaries, bilateral: Secondary | ICD-10-CM | POA: Insufficient documentation

## 2024-09-09 DIAGNOSIS — C530 Malignant neoplasm of endocervix: Secondary | ICD-10-CM | POA: Diagnosis not present

## 2024-09-09 NOTE — Progress Notes (Signed)
 Sarah Morris is here today for follow up post radiation to the pelvic.  They completed their radiation on: 02/02/24   Does the patient complain of any of the following:  Pain:No Abdominal bloating: No Diarrhea/Constipation: No Nausea/Vomiting: No Vaginal Discharge: no Blood in Urine or Stool: No Urinary Issues (dysuria/incomplete emptying/ incontinence/ increased frequency/urgency):  Urinary urgency and pressure at start of urine stream Does patient report using vaginal dilator 2-3 times a week and/or sexually active 2-3 weeks:  Yes, patient sexually active.  Post radiation skin changes: No   Additional comments if applicable: Patient has concerns about skin discoloration to forehead and scalp. Patient also reports hair loss.   BP 105/89 (BP Location: Left Arm, Patient Position: Sitting)   Pulse 92   Temp (!) 97.3 F (36.3 C) (Temporal)   Resp 18   Ht 5' 2 (1.575 m)   Wt 110 lb (49.9 kg)   LMP 08/27/2022 Comment: Spotted four days, 08-27-22 to 08-31-22;09-10-22 UA preg test negative  SpO2 100%   BMI 20.12 kg/m

## 2024-09-10 ENCOUNTER — Telehealth: Payer: Self-pay | Admitting: Radiation Oncology

## 2024-09-10 NOTE — Telephone Encounter (Signed)
 Left message for office to see pt for consultation.

## 2024-09-14 ENCOUNTER — Ambulatory Visit (INDEPENDENT_AMBULATORY_CARE_PROVIDER_SITE_OTHER): Admitting: Dermatology

## 2024-09-14 ENCOUNTER — Encounter: Payer: Self-pay | Admitting: Dermatology

## 2024-09-14 DIAGNOSIS — L649 Androgenic alopecia, unspecified: Secondary | ICD-10-CM

## 2024-09-14 DIAGNOSIS — L219 Seborrheic dermatitis, unspecified: Secondary | ICD-10-CM | POA: Diagnosis not present

## 2024-09-14 DIAGNOSIS — L819 Disorder of pigmentation, unspecified: Secondary | ICD-10-CM

## 2024-09-14 MED ORDER — SAFETY SEAL MISCELLANEOUS MISC: 1.0000 | Freq: Every morning | 9 refills | Status: AC

## 2024-09-14 MED ORDER — KETOCONAZOLE 2 % EX CREA: 1.0000 | TOPICAL_CREAM | Freq: Two times a day (BID) | CUTANEOUS | 5 refills | Status: AC

## 2024-09-14 MED ORDER — FLUCONAZOLE 150 MG PO TABS: 150.0000 mg | ORAL_TABLET | Freq: Every day | ORAL | 6 refills | Status: AC

## 2024-09-14 NOTE — Patient Instructions (Addendum)
 VISIT SUMMARY:  Today, we discussed your concerns about scaling, hypopigmentation, and hair thinning. These issues have been affecting you for the past one and a half to two months, and you are particularly distressed about the hair loss, especially after your chemotherapy. We also talked about how menopause might be impacting your skin and hair.  YOUR PLAN:  -SEBORRHEIC DERMATITIS OF SCALP AND FACE WITH SECONDARY HYPOPIGMENTATION:  Seborrheic dermatitis is a skin condition that causes scaling and redness, often due to an overgrowth of yeast on the skin. This can be worsened by hormonal changes during menopause.  To manage this, you will take fluconazole  100mg  for three consecutive days, use a 2% DHS zinc shampoo as a shampoo (weekly), apply ketoconazole cream to your face twice daily for one month, and use Avene Cicalfate moisturizer with copper and zinc peptide nightly. Avoid using cocoa butter on your face.  -ANDROGENETIC ALOPECIA (FEMALE PATTERN HAIR LOSS):  Androgenetic alopecia is a common form of hair loss in women, often related to hormonal changes during menopause.  To help with hair regrowth, you will use a topical compound prescription (Minoxidil and Finasteride) solution (will be mailed to you from [facility])every morning. Avoid applying it at night to prevent unwanted facial hair growth, and try to reduce tension on your hair by not using ponytails or hair straighteners. The solution will be mailed to you and costs $45.  -MENOPAUSAL STATE CONTRIBUTING TO DERMATOLOGIC CHANGES:  Menopause can cause various skin and hair changes due to decreased hormone levels. Continue your current estrogen therapy to help manage these symptoms.  INSTRUCTIONS:  Please follow the prescribed treatments and use the medications as directed. If you have any questions or notice any side effects, contact our office. We will schedule a follow-up appointment to monitor your progress.      Important  Information   Due to recent changes in healthcare laws, you may see results of your pathology and/or laboratory studies on MyChart before the doctors have had a chance to review them. We understand that in some cases there may be results that are confusing or concerning to you. Please understand that not all results are received at the same time and often the doctors may need to interpret multiple results in order to provide you with the best plan of care or course of treatment. Therefore, we ask that you please give us  2 business days to thoroughly review all your results before contacting the office for clarification. Should we see a critical lab result, you will be contacted sooner.     If You Need Anything After Your Visit   If you have any questions or concerns for your doctor, please call our main line at [phone]. If no one answers, please leave a voicemail as directed and we will return your call as soon as possible. Messages left after 4 pm will be answered the following business day.    You may also send us  a message via MyChart. We typically respond to MyChart messages within 1-2 business days.  For prescription refills, please ask your pharmacy to contact our office. Our fax number is [phone].  If you have an urgent issue when the clinic is closed that cannot wait until the next business day, you can page your doctor at the number below.     Please note that while we do our best to be available for urgent issues outside of office hours, we are not available 24/7.    If you have an  urgent issue and are unable to reach us , you may choose to seek medical care at your doctor's office, retail clinic, urgent care center, or emergency room.   If you have a medical emergency, please immediately call 911 or go to the emergency department. In the event of inclement weather, please call our main line at [phone] for an update on the status of any delays or closures.  Dermatology  Medication Tips: Please keep the boxes that topical medications come in in order to help keep track of the instructions about where and how to use these. Pharmacies typically print the medication instructions only on the boxes and not directly on the medication tubes.   If your medication is too expensive, please contact our office at [phone] or send us  a message through MyChart.    We are unable to tell what your co-pay for medications will be in advance as this is different depending on your insurance coverage. However, we may be able to find a substitute medication at lower cost or fill out paperwork to get insurance to cover a needed medication.    If a prior authorization is required to get your medication covered by your insurance company, please allow us  1-2 business days to complete this process.   Drug prices often vary depending on where the prescription is filled and some pharmacies may offer cheaper prices.   The website www.goodrx.com contains coupons for medications through different pharmacies. The prices here do not account for what the cost may be with help from insurance (it may be cheaper with your insurance), but the website can give you the price if you did not use any insurance.  - You can print the associated coupon and take it with your prescription to the pharmacy.  - You may also stop by our office during regular business hours and pick up a GoodRx coupon card.  - If you need your prescription sent electronically to a different pharmacy, notify our office through [facility] or by phone at [phone]

## 2024-09-14 NOTE — Progress Notes (Signed)
 New Patient Visit   Subjective  Sarah Morris is a 43 y.o. female who presents for the following: Hair Loss secondary to Seb Derm  Patient states she has hair loss located at the scalp that she would like to have examined. Patient reports the areas have been there for several months. She reports the areas are bothersome.She states the area can be very dry, flaky & itchy. Patient rates irritation 7 out of 10. Patient reports she has not previously been treated for these areas. Patient has hx of cervical cancer which was treated with Chemo and Radiation. Patient denies Hx of bx. Patient denies family history of skin cancer(s).  The following portions of the chart were reviewed this encounter and updated as appropriate: medications, allergies, medical history  Review of Systems:  No other skin or systemic complaints except as noted in HPI or Assessment and Plan.  Objective  Well appearing patient in no apparent distress; mood and affect are within normal limits.  A focused examination was performed of the following areas: Scalp and Face  Relevant exam findings are noted in the Assessment and Plan.           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 The yeast on the skin, which is normally present, has overgrown due to hormonal changes associated with menopause, leading to the current symptoms. The condition has been present for approximately 1.5 to 2 months. - Prescribe fluconazole  in a pulse dose for three consecutive days. - Recommend a 2% zinc shampoo to be used as a wash, allowing it to sit for three minutes before rinsing. - Prescribe ketoconazole cream to be applied to non-hair bearing areas of the face twice daily for one month. - Provide samples of a moisturizer with copper and zinc peptide to be used nightly to  repair the skin barrier and control yeast. - Advise against using cocoa butter on the face due to its thickness.  Androgenetic alopecia (female pattern hair loss) Androgenetic alopecia with noticeable hair thinning, likely related to menopausal hormonal changes. The condition is not due to scarring, indicating that hair regrowth is possible. She is currently on estrogen therapy, which may assist but is insufficient alone for hair regrowth. - Prescribe a topical solution to be applied every morning to stimulate hair growth and prevent further loss. - Advise against applying the topical solution at night to prevent unwanted facial hair growth. - Instruct to avoid tension on the hair by removing ponytails at home and avoiding hair straightening. - Inform that the topical solution is compounded and will be mailed to her, with a cost of $45.  Long term medication management.  Patient is using long term (months to years) prescription medication  to control their dermatologic condition.  These medications require periodic monitoring to evaluate for efficacy and side effects and may require periodic laboratory monitoring.   SEBORRHEIC DERMATITIS   Related Medications fluconazole  (DIFLUCAN ) 150 MG tablet Take 1 tablet (150 mg total) by mouth daily. ketoconazole (NIZORAL) 2 % cream Apply 1 Application topically 2 (two) times daily. Apply 2 times daily until irritation resolves ANDROGENETIC ALOPECIA   Related Medications Safety Seal Miscellaneous MISC Apply 1 Application topically in the morning. Medication Name: Hormonic Hair Solution (Minoxidil 8% and Finasteride 0.05%)  Return in about 5 months (around 02/12/2025) for Androgenetic Alopecia & Seb Derm F/U.  I, Jetta Ager, am  acting as scribe for [employer], DO.  Documentation: I have reviewed the above documentation for accuracy and completeness, and I agree with the above.  Delon Lenis, DO

## 2025-01-05 ENCOUNTER — Ambulatory Visit (HOSPITAL_COMMUNITY): Payer: MEDICAID

## 2025-01-10 ENCOUNTER — Ambulatory Visit: Admitting: Psychiatry

## 2025-02-14 ENCOUNTER — Ambulatory Visit: Admitting: Dermatology

## 2025-04-14 ENCOUNTER — Ambulatory Visit: Payer: MEDICAID | Admitting: Radiation Oncology
